# Patient Record
Sex: Female | Born: 1980 | Race: White | Hispanic: No | Marital: Married | Smoking: Former smoker
Health system: Southern US, Community
[De-identification: ages and names within clinical notes are randomized; demographics above are authoritative.]

## PROBLEM LIST (undated history)

## (undated) DIAGNOSIS — D509 Iron deficiency anemia, unspecified: Secondary | ICD-10-CM

## (undated) DIAGNOSIS — G40909 Epilepsy, unspecified, not intractable, without status epilepticus: Secondary | ICD-10-CM

## (undated) DIAGNOSIS — Z973 Presence of spectacles and contact lenses: Secondary | ICD-10-CM

## (undated) DIAGNOSIS — J302 Other seasonal allergic rhinitis: Secondary | ICD-10-CM

## (undated) DIAGNOSIS — R569 Unspecified convulsions: Secondary | ICD-10-CM

## (undated) DIAGNOSIS — G2581 Restless legs syndrome: Secondary | ICD-10-CM

## (undated) DIAGNOSIS — Z8679 Personal history of other diseases of the circulatory system: Secondary | ICD-10-CM

## (undated) DIAGNOSIS — K219 Gastro-esophageal reflux disease without esophagitis: Secondary | ICD-10-CM

## (undated) DIAGNOSIS — N921 Excessive and frequent menstruation with irregular cycle: Secondary | ICD-10-CM

## (undated) HISTORY — DX: Other seasonal allergic rhinitis: J30.2

## (undated) HISTORY — PX: WISDOM TOOTH EXTRACTION: SHX21

## (undated) HISTORY — DX: Gastro-esophageal reflux disease without esophagitis: K21.9

---

## 2002-10-20 ENCOUNTER — Emergency Department (HOSPITAL_COMMUNITY): Admission: EM | Admit: 2002-10-20 | Discharge: 2002-10-20 | Payer: Self-pay | Admitting: *Deleted

## 2003-09-13 ENCOUNTER — Emergency Department (HOSPITAL_COMMUNITY): Admission: EM | Admit: 2003-09-13 | Discharge: 2003-09-13 | Payer: Self-pay | Admitting: Emergency Medicine

## 2004-01-20 ENCOUNTER — Emergency Department (HOSPITAL_COMMUNITY): Admission: EM | Admit: 2004-01-20 | Discharge: 2004-01-21 | Payer: Self-pay | Admitting: Emergency Medicine

## 2004-02-19 ENCOUNTER — Emergency Department (HOSPITAL_COMMUNITY): Admission: EM | Admit: 2004-02-19 | Discharge: 2004-02-19 | Payer: Self-pay | Admitting: Emergency Medicine

## 2004-04-24 ENCOUNTER — Emergency Department (HOSPITAL_COMMUNITY): Admission: EM | Admit: 2004-04-24 | Discharge: 2004-04-25 | Payer: Self-pay | Admitting: Emergency Medicine

## 2004-04-29 ENCOUNTER — Ambulatory Visit (HOSPITAL_COMMUNITY): Admission: RE | Admit: 2004-04-29 | Discharge: 2004-04-29 | Payer: Self-pay

## 2004-07-30 ENCOUNTER — Emergency Department (HOSPITAL_COMMUNITY): Admission: EM | Admit: 2004-07-30 | Discharge: 2004-07-30 | Payer: Self-pay | Admitting: Emergency Medicine

## 2005-05-20 ENCOUNTER — Inpatient Hospital Stay (HOSPITAL_COMMUNITY): Admission: AD | Admit: 2005-05-20 | Discharge: 2005-05-20 | Payer: Self-pay | Admitting: Obstetrics and Gynecology

## 2005-05-24 ENCOUNTER — Inpatient Hospital Stay (HOSPITAL_COMMUNITY): Admission: AD | Admit: 2005-05-24 | Discharge: 2005-05-24 | Payer: Self-pay | Admitting: *Deleted

## 2005-06-04 ENCOUNTER — Inpatient Hospital Stay (HOSPITAL_COMMUNITY): Admission: AD | Admit: 2005-06-04 | Discharge: 2005-06-05 | Payer: Self-pay | Admitting: Obstetrics and Gynecology

## 2005-06-06 ENCOUNTER — Inpatient Hospital Stay (HOSPITAL_COMMUNITY): Admission: AD | Admit: 2005-06-06 | Discharge: 2005-06-11 | Payer: Self-pay | Admitting: Obstetrics and Gynecology

## 2005-06-14 ENCOUNTER — Inpatient Hospital Stay (HOSPITAL_COMMUNITY): Admission: AD | Admit: 2005-06-14 | Discharge: 2005-06-14 | Payer: Self-pay | Admitting: Obstetrics and Gynecology

## 2005-06-14 ENCOUNTER — Encounter: Payer: Self-pay | Admitting: Emergency Medicine

## 2005-06-14 ENCOUNTER — Encounter (INDEPENDENT_AMBULATORY_CARE_PROVIDER_SITE_OTHER): Payer: Self-pay | Admitting: Cardiology

## 2006-04-28 ENCOUNTER — Emergency Department (HOSPITAL_COMMUNITY): Admission: EM | Admit: 2006-04-28 | Discharge: 2006-04-28 | Payer: Self-pay | Admitting: Family Medicine

## 2006-05-29 ENCOUNTER — Emergency Department (HOSPITAL_COMMUNITY): Admission: EM | Admit: 2006-05-29 | Discharge: 2006-05-29 | Payer: Self-pay | Admitting: Family Medicine

## 2006-06-15 ENCOUNTER — Emergency Department (HOSPITAL_COMMUNITY): Admission: EM | Admit: 2006-06-15 | Discharge: 2006-06-15 | Payer: Self-pay | Admitting: Emergency Medicine

## 2006-11-23 ENCOUNTER — Emergency Department (HOSPITAL_COMMUNITY): Admission: EM | Admit: 2006-11-23 | Discharge: 2006-11-23 | Payer: Self-pay | Admitting: Family Medicine

## 2007-05-02 ENCOUNTER — Emergency Department (HOSPITAL_COMMUNITY): Admission: EM | Admit: 2007-05-02 | Discharge: 2007-05-02 | Payer: Self-pay | Admitting: Emergency Medicine

## 2007-08-09 ENCOUNTER — Emergency Department (HOSPITAL_COMMUNITY): Admission: EM | Admit: 2007-08-09 | Discharge: 2007-08-09 | Payer: Self-pay | Admitting: Emergency Medicine

## 2007-08-13 ENCOUNTER — Inpatient Hospital Stay (HOSPITAL_COMMUNITY): Admission: AD | Admit: 2007-08-13 | Discharge: 2007-08-13 | Payer: Self-pay | Admitting: Obstetrics

## 2007-08-16 ENCOUNTER — Ambulatory Visit (HOSPITAL_COMMUNITY): Admission: RE | Admit: 2007-08-16 | Discharge: 2007-08-16 | Payer: Self-pay | Admitting: Certified Nurse Midwife

## 2007-10-24 ENCOUNTER — Emergency Department (HOSPITAL_COMMUNITY): Admission: EM | Admit: 2007-10-24 | Discharge: 2007-10-24 | Payer: Self-pay | Admitting: Family Medicine

## 2008-01-28 ENCOUNTER — Inpatient Hospital Stay (HOSPITAL_COMMUNITY): Admission: AD | Admit: 2008-01-28 | Discharge: 2008-01-29 | Payer: Self-pay | Admitting: Obstetrics

## 2008-02-08 ENCOUNTER — Inpatient Hospital Stay (HOSPITAL_COMMUNITY): Admission: AD | Admit: 2008-02-08 | Discharge: 2008-02-11 | Payer: Self-pay | Admitting: Obstetrics & Gynecology

## 2008-04-16 HISTORY — PX: LAPAROSCOPIC CHOLECYSTECTOMY: SUR755

## 2008-04-17 ENCOUNTER — Encounter: Admission: RE | Admit: 2008-04-17 | Discharge: 2008-04-17 | Payer: Self-pay | Admitting: Family Medicine

## 2008-06-18 ENCOUNTER — Emergency Department (HOSPITAL_COMMUNITY): Admission: EM | Admit: 2008-06-18 | Discharge: 2008-06-18 | Payer: Self-pay | Admitting: Emergency Medicine

## 2009-08-13 ENCOUNTER — Emergency Department (HOSPITAL_COMMUNITY): Admission: EM | Admit: 2009-08-13 | Discharge: 2009-08-13 | Payer: Self-pay | Admitting: Emergency Medicine

## 2010-07-12 ENCOUNTER — Emergency Department (HOSPITAL_COMMUNITY): Admission: EM | Admit: 2010-07-12 | Discharge: 2010-07-12 | Payer: Self-pay | Admitting: Emergency Medicine

## 2010-12-07 ENCOUNTER — Encounter: Payer: Self-pay | Admitting: Family Medicine

## 2010-12-09 LAB — HEPATITIS B SURFACE ANTIGEN: Hepatitis B Surface Ag: NEGATIVE

## 2010-12-09 LAB — ABO/RH: RH Type: POSITIVE

## 2010-12-09 LAB — HIV ANTIBODY (ROUTINE TESTING W REFLEX): HIV: NONREACTIVE

## 2011-02-20 LAB — CBC
HCT: 36 % (ref 36.0–46.0)
Hemoglobin: 11.6 g/dL — ABNORMAL LOW (ref 12.0–15.0)
MCHC: 32.3 g/dL (ref 30.0–36.0)
MCV: 73.4 fL — ABNORMAL LOW (ref 78.0–100.0)
Platelets: 302 10*3/uL (ref 150–400)
RDW: 15.7 % — ABNORMAL HIGH (ref 11.5–15.5)

## 2011-02-20 LAB — URINE CULTURE: Colony Count: 80000

## 2011-02-20 LAB — DIFFERENTIAL
Basophils Absolute: 0 10*3/uL (ref 0.0–0.1)
Eosinophils Absolute: 0.1 10*3/uL (ref 0.0–0.7)
Eosinophils Relative: 1 % (ref 0–5)
Monocytes Absolute: 0.4 10*3/uL (ref 0.1–1.0)

## 2011-02-20 LAB — URINALYSIS, ROUTINE W REFLEX MICROSCOPIC
Glucose, UA: NEGATIVE mg/dL
Hgb urine dipstick: NEGATIVE
Protein, ur: NEGATIVE mg/dL
Specific Gravity, Urine: 1.016 (ref 1.005–1.030)
Urobilinogen, UA: 0.2 mg/dL (ref 0.0–1.0)

## 2011-02-20 LAB — POCT I-STAT, CHEM 8
Calcium, Ion: 1.16 mmol/L (ref 1.12–1.32)
Creatinine, Ser: 0.6 mg/dL (ref 0.4–1.2)
Glucose, Bld: 115 mg/dL — ABNORMAL HIGH (ref 70–99)
HCT: 37 % (ref 36.0–46.0)
Hemoglobin: 12.6 g/dL (ref 12.0–15.0)

## 2011-02-20 LAB — URINE MICROSCOPIC-ADD ON

## 2011-02-20 LAB — D-DIMER, QUANTITATIVE: D-Dimer, Quant: <0.22 ug/mL-FEU (ref 0.00–0.48)

## 2011-03-20 ENCOUNTER — Encounter: Payer: Self-pay | Admitting: Cardiovascular Disease

## 2011-03-20 DIAGNOSIS — I10 Essential (primary) hypertension: Secondary | ICD-10-CM | POA: Insufficient documentation

## 2011-03-20 DIAGNOSIS — D649 Anemia, unspecified: Secondary | ICD-10-CM | POA: Insufficient documentation

## 2011-03-20 DIAGNOSIS — J4520 Mild intermittent asthma, uncomplicated: Secondary | ICD-10-CM | POA: Insufficient documentation

## 2011-03-21 ENCOUNTER — Inpatient Hospital Stay (HOSPITAL_COMMUNITY)
Admission: AD | Admit: 2011-03-21 | Discharge: 2011-03-21 | Disposition: A | Discharge status: Home / Self Care | Payer: Medicaid Other | Source: Ambulatory Visit | Attending: Obstetrics and Gynecology | Admitting: Obstetrics and Gynecology

## 2011-03-21 DIAGNOSIS — R197 Diarrhea, unspecified: Secondary | ICD-10-CM | POA: Insufficient documentation

## 2011-03-21 DIAGNOSIS — O212 Late vomiting of pregnancy: Secondary | ICD-10-CM | POA: Insufficient documentation

## 2011-03-21 LAB — URINALYSIS, ROUTINE W REFLEX MICROSCOPIC
Bilirubin Urine: NEGATIVE
Glucose, UA: NEGATIVE mg/dL
Ketones, ur: NEGATIVE mg/dL
Specific Gravity, Urine: 1.015 (ref 1.005–1.030)
pH: 5.5 (ref 5.0–8.0)

## 2011-03-21 LAB — URINE MICROSCOPIC-ADD ON

## 2011-03-21 LAB — COMPREHENSIVE METABOLIC PANEL
ALT: 10 U/L (ref 0–35)
CO2: 18 mEq/L — ABNORMAL LOW (ref 19–32)
Calcium: 9.3 mg/dL (ref 8.4–10.5)
Chloride: 101 mEq/L (ref 96–112)
Creatinine, Ser: 0.61 mg/dL (ref 0.4–1.2)
GFR calc non Af Amer: >60 mL/min (ref >60)
Glucose, Bld: 94 mg/dL (ref 70–99)
Total Bilirubin: 0.2 mg/dL — ABNORMAL LOW (ref 0.3–1.2)

## 2011-03-21 LAB — CBC
HCT: 33.4 % — ABNORMAL LOW (ref 36.0–46.0)
Hemoglobin: 10.5 g/dL — ABNORMAL LOW (ref 12.0–15.0)
MCH: 23.2 pg — ABNORMAL LOW (ref 26.0–34.0)
MCHC: 31.4 g/dL (ref 30.0–36.0)

## 2011-03-21 LAB — DIFFERENTIAL
Basophils Absolute: 0 10*3/uL (ref 0.0–0.1)
Basophils Relative: 0 % (ref 0–1)
Lymphocytes Relative: 12 % (ref 12–46)
Monocytes Absolute: 0.6 10*3/uL (ref 0.1–1.0)
Neutro Abs: 11 10*3/uL — ABNORMAL HIGH (ref 1.7–7.7)
Neutrophils Relative %: 83 % — ABNORMAL HIGH (ref 43–77)

## 2011-03-23 ENCOUNTER — Inpatient Hospital Stay (HOSPITAL_COMMUNITY)
Admission: AD | Admit: 2011-03-23 | Discharge: 2011-03-23 | Disposition: A | Discharge status: Home / Self Care | Payer: Medicaid Other | Source: Ambulatory Visit | Attending: Obstetrics and Gynecology | Admitting: Obstetrics and Gynecology

## 2011-03-23 DIAGNOSIS — B9789 Other viral agents as the cause of diseases classified elsewhere: Secondary | ICD-10-CM | POA: Insufficient documentation

## 2011-03-23 DIAGNOSIS — E860 Dehydration: Secondary | ICD-10-CM | POA: Insufficient documentation

## 2011-03-23 DIAGNOSIS — O99891 Other specified diseases and conditions complicating pregnancy: Secondary | ICD-10-CM | POA: Insufficient documentation

## 2011-03-23 LAB — COMPREHENSIVE METABOLIC PANEL
ALT: 31 U/L (ref 0–35)
Alkaline Phosphatase: 112 U/L (ref 39–117)
BUN: 5 mg/dL — ABNORMAL LOW (ref 6–23)
CO2: 21 mEq/L (ref 19–32)
Chloride: 101 mEq/L (ref 96–112)
GFR calc non Af Amer: >60 mL/min (ref >60)
Glucose, Bld: 87 mg/dL (ref 70–99)
Potassium: 3.5 mEq/L (ref 3.5–5.1)
Sodium: 132 mEq/L — ABNORMAL LOW (ref 135–145)
Total Bilirubin: 0.2 mg/dL — ABNORMAL LOW (ref 0.3–1.2)

## 2011-03-23 LAB — URINE CULTURE: Culture: NO GROWTH

## 2011-03-23 LAB — OVA AND PARASITE EXAMINATION: Ova and parasites: NONE SEEN

## 2011-03-23 LAB — URINALYSIS, ROUTINE W REFLEX MICROSCOPIC
Nitrite: NEGATIVE
Protein, ur: NEGATIVE mg/dL
Specific Gravity, Urine: 1.025 (ref 1.005–1.030)
Urobilinogen, UA: 0.2 mg/dL (ref 0.0–1.0)

## 2011-03-23 LAB — CBC
HCT: 32.3 % — ABNORMAL LOW (ref 36.0–46.0)
RDW: 17 % — ABNORMAL HIGH (ref 11.5–15.5)
WBC: 10 10*3/uL (ref 4.0–10.5)

## 2011-03-23 LAB — URINE MICROSCOPIC-ADD ON

## 2011-03-26 ENCOUNTER — Ambulatory Visit: Payer: Self-pay | Admitting: Cardiovascular Disease

## 2011-03-26 ENCOUNTER — Encounter: Payer: Self-pay | Admitting: Cardiovascular Disease

## 2011-03-26 ENCOUNTER — Ambulatory Visit (INDEPENDENT_AMBULATORY_CARE_PROVIDER_SITE_OTHER): Payer: Medicaid Other | Admitting: Cardiovascular Disease

## 2011-03-26 VITALS — BP 116/66 | HR 80 | Ht 67.0 in | Wt 265.0 lb

## 2011-03-26 DIAGNOSIS — I509 Heart failure, unspecified: Secondary | ICD-10-CM

## 2011-03-26 NOTE — Progress Notes (Signed)
Lindsay Aguilar Date of Birth  April 17, 1981 [facility] / [facility] [address]     [address] [place], [place]  [address] [phone]  Fax  [phone]  History of Present Illness:  [redacted] weeks pregnant,  History of CHF with her first pregnancy.  2nd pregnancy was normal. No dyspnea. No chest pain. No syncope. No leg swelling.   Normal indigestion and other pregnancy related.  Recently had food poisoning.  Current Outpatient Prescriptions on File Prior to Visit  Medication Sig Dispense Refill  . Prenatal Vit-Fe Sulfate-FA (PRENATAL MULTIVIT-IRON PO) Take by mouth daily.          Allergies no known allergies  Past Medical History  Diagnosis Date  . Asthma   . CHF (congestive heart failure)   . Hypertension   . Anemia     No past surgical history on file.  History  Smoking status  . Former Smoker  . Types: Cigarettes  . Quit date: 11/16/2006  Smokeless tobacco  . Not on file    History  Alcohol Use No    No family history on file.  Reviw of Systems:  Reviewed in the HPI.  All other systems are negative.  Physical Exam: BP 116/66  Pulse 80  Ht 5\' 7"  (1.702 m)  Wt 265 lb (120.203 kg)  BMI 41.50 kg/m2 The patient is alert and oriented x 3.  The mood and affect are normal.  The skin is warm and dry.  Color is normal.  The HEENT exam reveals that the sclera are nonicteric.  The mucous membranes are moist.  The carotids are 2+ without bruits.  There is no thyromegaly.  There is no JVD.  The lungs are clear.  The chest wall is non tender.  The heart exam reveals a regular rate with a normal S1 and S2.  There is a soft systolic murmur.  The PMI is not displaced.   Abdominal exam reveals good bowel sounds.  There is no guarding or rebound.  There is no hepatosplenomegaly or tenderness.  There are no masses.  Exam of the legs reveal no clubbing, cyanosis, or edema.  The legs are without rashes.  The distal pulses are intact.  Cranial nerves II -  XII are intact.  Motor and sensory functions are intact.  The gait is normal.  ECG:  Assessment / Plan:

## 2011-03-26 NOTE — Assessment & Plan Note (Signed)
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 She probably had congestive heart failure during her first pregnancy. Her second pregnancy was uncomplicated. So far her third pregnancy has been compensated by her doctor was worried that she may be at risk for cardiopathy.  At present she has no signs of congestive heart failure. She does not have an S3 gallop. She does not have any leg swelling. She denies any PND or orthopnea. At this point I cannot predict whether or not she'll have pericardial cardiomyopathy which is most likely to occur in her ninth month or just shortly after her delivery. She certainly has no signs of congestive heart failure at present.  I've asked her to watch her salt intake. I've asked her to exercise as much is possible. I'll see her back on an as-needed basis.

## 2011-03-31 NOTE — H&P (Signed)
NAME:  DERENDA, GIDDINGS NO.:  [id number]   MEDICAL RECORD NO.:  [id number]          PATIENT TYPE:   LOCATION:                                 FACILITY:   PHYSICIAN:  Lenoard Aden, M.D.DATE OF BIRTH:  23-Aug-1981   DATE OF ADMISSION:  02/08/2008  DATE OF DISCHARGE:                              HISTORY & PHYSICAL   CHIEF COMPLAINT:  Postdate.   HISTORY OF PRESENT ILLNESS:  The patient is a 30 year old white female  G2, P1 at 4 weeks' gestation who presents for induction of labor.   ALLERGIES:  SHE HAS ALLERGIES TO AMOXICILLIN AND NO KNOWN LATEX  ALLERGIES.   SOCIAL HISTORY:  She is a nonsmoker, nondrinker.  She denies domestic or  physical violence.   FAMILY HISTORY:  Heart disease, kidney stone, COPD, colon cancer,  alcohol abuse, gout, diabetes, and depression.   PERSONAL HISTORY:  History of anxious, asthma, wisdom tooth extraction,  and anemia.  She is a previous smoker, reportedly reformed.  She had  previous vaginal delivery at 41 weeks', have a 7-pound 4-ounce fetus.  Her prenatal course today is uncomplicated.   PHYSICAL EXAMINATION:  GENERAL:  She is well-developed, well-nourished  white female, in no acute distress.  HEENT:  Normal.  LUNGS:  Clear.  HEART:  Regular rate and rhythm.  ABDOMEN:  Soft, gravid, nondistended.  Estimated fetal on ultrasound 8-  1/2 pounds.  Cervix is fingertip, 50%, vertex -1.  EXTREMITIES:  __________  NEUROLOGIC: Nonfocal.  SKIN: Intact.   IMPRESSION:  A 41-week intrauterine pregnancy for postdate induction.   PLAN:  Place Cervidil after reactive NST and to start Pitocin in the  morning, penicillin prophylaxis for GBS positivity, anticipated  __________ vaginal delivery.      Lenoard Aden, M.D.  Electronically Signed    RJT/MEDQ  D:  02/08/2008  T:  02/09/2008  Job:  [id number]

## 2011-04-03 NOTE — Consult Note (Signed)
NAME:  Lindsay Aguilar, Lindsay Aguilar NO.:  [id number]   MEDICAL RECORD NO.:  [id number]          PATIENT TYPE:  [facility]   LOCATION:  [facility]                          FACILITY:  [facility]   PHYSICIAN:  Lenoard Aden, M.D.DATE OF BIRTH:  01/01/1981   DATE OF CONSULTATION:  05/20/2005  DATE OF DISCHARGE:                                   CONSULTATION   CHIEF COMPLAINT:  Decreased fetal movement.   HISTORY OF PRESENT ILLNESS:  The patient is a 30 year old white female, G1,  P0, [facility] June 04, 2005, at 37 plus weeks, who presents with decreased fetal  movement today.   ALLERGIES:  AMOXICILLIN.   MEDICATIONS:  Prenatal vitamins.   PAST MEDICAL HISTORY:  1.  Allergies.  2.  Wisdom tooth removal.   FAMILY HISTORY:  History of hypertension, myocardial infarction, chronic  obstructive pulmonary disease, insulin-dependent diabetes, and urolithiasis.   PRENATAL COURSE:  Uncomplicated.   PHYSICAL EXAMINATION:  GENERAL:  She is a well-developed, well-nourished,  white female in no acute distress.  HEENT:  Normal.  LUNGS:  Clear.  HEART:  Regular rhythm.  ABDOMEN:  Soft, gravid, nontender.  CERVICAL EXAM:  Deferred.  NEUROLOGIC:  Nonfocal.  SKIN:  Intact.   IMPRESSION:  1.  A 37 week intrauterine pregnancy.  2.  Decreased fetal movement with reactive NST.   PLAN:  Discharge home.  Follow up in the office for scheduled visit.  Fetal  activity discussed.       RJT/MEDQ  D:  05/20/2005  T:  05/20/2005  Job:  [id number]   cc:   Ma Hillock

## 2011-04-17 ENCOUNTER — Ambulatory Visit (HOSPITAL_COMMUNITY)
Admission: RE | Admit: 2011-04-17 | Discharge: 2011-04-17 | Disposition: A | Discharge status: Home / Self Care | Payer: Medicaid Other | Source: Ambulatory Visit | Attending: Obstetrics and Gynecology | Admitting: Obstetrics and Gynecology

## 2011-05-22 ENCOUNTER — Inpatient Hospital Stay (HOSPITAL_COMMUNITY)
Admission: AD | Admit: 2011-05-22 | Discharge: 2011-05-22 | Disposition: A | Discharge status: Home / Self Care | Payer: Medicaid Other | Source: Ambulatory Visit | Attending: Obstetrics and Gynecology | Admitting: Obstetrics and Gynecology

## 2011-05-22 DIAGNOSIS — O479 False labor, unspecified: Secondary | ICD-10-CM | POA: Insufficient documentation

## 2011-05-25 ENCOUNTER — Inpatient Hospital Stay (HOSPITAL_COMMUNITY)
Admission: AD | Admit: 2011-05-25 | Discharge: 2011-05-25 | Disposition: A | Discharge status: Home / Self Care | Payer: Medicaid Other | Source: Ambulatory Visit | Attending: Obstetrics and Gynecology | Admitting: Obstetrics and Gynecology

## 2011-05-25 ENCOUNTER — Encounter (HOSPITAL_COMMUNITY): Payer: Self-pay

## 2011-05-25 DIAGNOSIS — O471 False labor at or after 37 completed weeks of gestation: Secondary | ICD-10-CM

## 2011-05-25 DIAGNOSIS — O479 False labor, unspecified: Secondary | ICD-10-CM | POA: Insufficient documentation

## 2011-05-25 NOTE — Initial Assessments (Signed)
Pt C/O small amount of bleeding, noted on toilet tissue, at 0530.  States she had more bright red bleeding @ 0730.  Cramping x 1 week.

## 2011-05-25 NOTE — Progress Notes (Signed)
Pt states she had blood on the tissue with wiping after urinating x 2 this am. Pad pt has on does not have any blood on it. Last exam was on Friday and no intercourse in several days

## 2011-05-25 NOTE — ED Provider Notes (Signed)
History     Chief Complaint  Patient presents with  . Vaginal Bleeding   Vaginal Bleeding The patient's primary symptoms include vaginal bleeding. The current episode started today. The problem has been resolved. The pain is mild. The problem affects both sides. She is pregnant. Pertinent negatives include no abdominal pain, chills, constipation, diarrhea, dysuria, flank pain, frequency, headaches, hematuria, nausea, urgency or vomiting. She has not been passing clots. She has not been passing tissue. The symptoms are aggravated by nothing. She has tried nothing for the symptoms. The treatment provided significant relief. Sexual activity: No recent Sexual Activity. No, her partner does not have an STD.   Describes 2 episodes of BRB this am on tissue after wiping. Denies Reg UC's, LOF. Denies prev hx of hemorrhoids. Denies Urinary s/sx. Reports GFM.   OB History    Grav Para Term Preterm Abortions TAB SAB Ect Mult Living   3 2 2  0 0 0 0 0 0 2      Past Medical History  Diagnosis Date  . Asthma   . 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 Hypertension   . Anemia     only with pregnancy    Past Surgical History  Procedure Date  . Cholecystectomy 04/2008  . Wisdom tooth extraction 1997    Family History  Problem Relation Age of Onset  . Anesthesia problems Other     History  Substance Use Topics  . Smoking status: Former Smoker    Types: Cigarettes    Quit date: 11/16/2006  . Smokeless tobacco: Former Neurosurgeon    Quit date: 05/24/2008  . Alcohol Use: No    Allergies: No Known Allergies  Prescriptions prior to admission  Medication Sig Dispense Refill  . acetaminophen (TYLENOL) 500 MG tablet Take 1,000 mg by mouth daily as needed. For pain       . cyclobenzaprine (FLEXERIL) 10 MG tablet Take 10 mg by mouth at bedtime as needed. For sciatica      . ferrous sulfate 325 (65 FE) MG tablet Take 650 mg by mouth daily with  breakfast.        . hydrOXYzine (VISTARIL) 50 MG capsule Take 50 mg by mouth at bedtime as needed. For itching.       . pantoprazole (PROTONIX) 40 MG tablet Take 40 mg by mouth daily.        . Prenatal Vit-Fe Sulfate-FA (PRENATAL MULTIVIT-IRON PO) Take 1 tablet by mouth daily.         Review of Systems  Constitutional: Negative for chills.  Eyes: Negative for blurred vision and photophobia.  Respiratory: Negative for shortness of breath.   Cardiovascular: Positive for leg swelling (2+ pitting edema to lower extremities, no change from previous visit). Negative for chest pain.  Gastrointestinal: Negative for heartburn, nausea, vomiting, abdominal pain, diarrhea and constipation.  Genitourinary: Positive for vaginal bleeding. Negative for dysuria, urgency, frequency, hematuria and flank pain.  Neurological: Negative for dizziness, sensory change and headaches.   Physical Exam   Blood pressure 101/64, pulse 97, temperature 98.3 F (36.8 C), temperature source Oral, resp. rate 20, height 5\' 7"  (1.702 m), weight 286 lb (129.729 kg).  Physical Exam  Constitutional: She is oriented to person, place, and time. She appears well-developed and well-nourished. No distress.  HENT:  Head: Normocephalic.  Cardiovascular: Regular rhythm.   Respiratory: Effort normal.  GI:       Gravid, NT, approp for gest age  Genitourinary: Vagina normal and uterus normal. No vaginal discharge found.  Musculoskeletal: She exhibits edema.  Neurological: She is alert and oriented to person, place, and time. She has normal reflexes.  Skin: Skin is warm and dry.  Psychiatric: She has a normal mood and affect.  FHR: 125 Cat I Reactive NST, no decels Toco: occ, mild UC, pt reports as "mild menstrual cramping"  MAU Course  Procedures Sterile Spec exam: no bleeding noted @ os, in vault, or on perineum SVE 1/thick/-2 vtx BOW Intact, no blood on glove

## 2011-05-25 NOTE — Progress Notes (Signed)
Irish Lack CNM notified of pt status, small amount bleeding today, irregular uc's, one variable decel noted.  CNM is coming to see pt.

## 2011-05-28 ENCOUNTER — Other Ambulatory Visit (HOSPITAL_COMMUNITY): Payer: Self-pay | Admitting: Obstetrics and Gynecology

## 2011-05-28 DIAGNOSIS — O368190 Decreased fetal movements, unspecified trimester, not applicable or unspecified: Secondary | ICD-10-CM

## 2011-05-29 ENCOUNTER — Ambulatory Visit (HOSPITAL_COMMUNITY)
Admission: RE | Admit: 2011-05-29 | Discharge: 2011-05-29 | Disposition: A | Discharge status: Home / Self Care | Payer: Medicaid Other | Source: Ambulatory Visit | Attending: Obstetrics and Gynecology | Admitting: Obstetrics and Gynecology

## 2011-05-29 ENCOUNTER — Inpatient Hospital Stay (HOSPITAL_COMMUNITY)
Admission: AD | Admit: 2011-05-29 | Discharge: 2011-05-29 | Disposition: A | Discharge status: Home / Self Care | Payer: Medicaid Other | Source: Ambulatory Visit | Attending: Obstetrics and Gynecology | Admitting: Obstetrics and Gynecology

## 2011-05-29 ENCOUNTER — Other Ambulatory Visit: Payer: Self-pay | Admitting: Obstetrics and Gynecology

## 2011-05-29 ENCOUNTER — Encounter (HOSPITAL_COMMUNITY): Payer: Self-pay | Admitting: *Deleted

## 2011-05-29 DIAGNOSIS — O368190 Decreased fetal movements, unspecified trimester, not applicable or unspecified: Secondary | ICD-10-CM

## 2011-05-29 NOTE — Initial Assessments (Signed)
Nigel Bridgeman , CNM in room discussion plan

## 2011-05-29 NOTE — Progress Notes (Signed)
nst after [place] 6/8 BPP

## 2011-05-29 NOTE — H&P (Signed)
Lindsay Aguilar is an 30 y.o. female. Who presents for induction due to perception of decreased FM over the last week, with BPP 6/8 on 7/13, but negative spontaneous CST in MAU.  Patient requests induction due to increased anxiety regarding fetal status.  She accepts the risk of induction including failure of method, prolonged labor, need for serial induction, and possible need for C/S.  Pregnancy remarkable for: Asthma Elevated BMI Hx pp pulmonary edema Itching during pregnancy, with negative w/u for elevated bile acids. ASCUS on pap with negative HPV GBS negative Mild anemia  Pertinent Gynecological History: Menses: regular every month without intermenstrual spotting  Contraception: none DES exposure: unknown Blood transfusions: none Sexually transmitted diseases: none Previous GYN Procedures: none  Last mammogram: NA Last pap: abnormal: ASCUS with Negative HPV Date: 1/12 OB History: G3, P2 Patient was induced at 41 weeks with both previous babies. Had pp pulmonary edema after last delivery, likely due to mildly elevated BP  Menstrual History: Menarche age: 29 No LMP recorded. Patient is pregnant.   LMP:  08/29/2010 Past Medical History  Diagnosis Date  . Asthma   . 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 Hypertension   . Anemia     only with pregnancy    Past Surgical History  Procedure Date  . Cholecystectomy 04/2008  . Wisdom tooth extraction 1997    Family History  Problem Relation Age of Onset  . Anesthesia problems Other   . Depression Father     Social History:  reports that she quit smoking about 4 years ago. Her smoking use included Cigarettes. She quit smokeless tobacco use about 3 years ago. She reports that she does not drink alcohol or use illicit drugs. Married to FOB, Adela [name], who is involved and supportive.  Patient is [facility].    Allergies: No Known Allergies   (Not in a hospital  admission)  Review of Systems  All other systems reviewed and are negative.    There were no vitals taken for this visit. Physical Exam  Constitutional: She is oriented to person, place, and time. Vital signs are normal. She appears well-developed and well-nourished.  HENT:  Head: Normocephalic.  Cardiovascular: Normal rate and regular rhythm.   Respiratory: Breath sounds normal.  Musculoskeletal: Normal range of motion.  Neurological: She is alert and oriented to person, place, and time. She has normal reflexes.  Skin: Skin is warm and dry.  Psychiatric: She has a normal mood and affect.   Cervix:  On 05/29/11, cervix was external os 1 cm, internal os FT, long, vtx, -2, cx posterior.  No results found for this or any previous visit (from the past 24 hour(s)).  @[id number] @  Assessment/Plan: IUP at 39 weeks Perceived decreased FM, with attendant anxiety, and BPP 6/8 GBS negative  Plan: Admit to [facility] per consult with Dr. Pennie Rushing. Routine CNM orders Plan cervical ripening with Cervidil overnight, then pitocin in am. Pain medication prn.  Urania Pearlman L 05/29/2011, 3:40 PM

## 2011-05-30 ENCOUNTER — Inpatient Hospital Stay (HOSPITAL_COMMUNITY)
Admission: RE | Admit: 2011-05-30 | Discharge: 2011-06-02 | DRG: 775 | Disposition: A | Discharge status: Home / Self Care | Payer: Medicaid Other | Source: Ambulatory Visit | Attending: Obstetrics and Gynecology | Admitting: Obstetrics and Gynecology

## 2011-05-30 ENCOUNTER — Encounter (HOSPITAL_COMMUNITY): Payer: Self-pay

## 2011-05-30 VITALS — BP 102/64 | HR 73 | Temp 98.0°F | Resp 18 | Ht 67.0 in | Wt 278.2 lb

## 2011-05-30 DIAGNOSIS — O9903 Anemia complicating the puerperium: Secondary | ICD-10-CM | POA: Diagnosis not present

## 2011-05-30 DIAGNOSIS — D649 Anemia, unspecified: Secondary | ICD-10-CM | POA: Diagnosis not present

## 2011-05-30 DIAGNOSIS — O368190 Decreased fetal movements, unspecified trimester, not applicable or unspecified: Principal | ICD-10-CM | POA: Diagnosis present

## 2011-05-30 LAB — COMPREHENSIVE METABOLIC PANEL
ALT: 9 U/L (ref 0–35)
Alkaline Phosphatase: 158 U/L — ABNORMAL HIGH (ref 39–117)
CO2: 21 mEq/L (ref 19–32)
GFR calc Af Amer: >60 mL/min (ref >60)
GFR calc non Af Amer: >60 mL/min (ref >60)
Glucose, Bld: 84 mg/dL (ref 70–99)
Potassium: 3.7 mEq/L (ref 3.5–5.1)
Sodium: 129 mEq/L — ABNORMAL LOW (ref 135–145)
Total Bilirubin: 0.2 mg/dL — ABNORMAL LOW (ref 0.3–1.2)

## 2011-05-30 LAB — CBC
HCT: 30.3 % — ABNORMAL LOW (ref 36.0–46.0)
Hemoglobin: 9.7 g/dL — ABNORMAL LOW (ref 12.0–15.0)
MCHC: 32 g/dL (ref 30.0–36.0)
RDW: 18.4 % — ABNORMAL HIGH (ref 11.5–15.5)
WBC: 18.1 10*3/uL — ABNORMAL HIGH (ref 4.0–10.5)

## 2011-05-30 LAB — URINALYSIS, DIPSTICK ONLY
Bilirubin Urine: NEGATIVE
Ketones, ur: NEGATIVE mg/dL
Nitrite: NEGATIVE
Urobilinogen, UA: 0.2 mg/dL (ref 0.0–1.0)

## 2011-05-30 MED ORDER — ZOLPIDEM TARTRATE 10 MG PO TABS: 10.0000 mg | ORAL_TABLET | Freq: Every evening | ORAL | Status: DC | PRN

## 2011-05-30 MED ORDER — SODIUM CHLORIDE 0.9 % IJ SOLN
3.0000 mL | INTRAMUSCULAR | Status: DC | PRN
  Administered 2011-05-31: 3 mL via INTRAVENOUS

## 2011-05-30 MED ORDER — TERBUTALINE SULFATE 1 MG/ML IJ SOLN: 0.2500 mg | Freq: Once | INTRAMUSCULAR | Status: AC | PRN

## 2011-05-30 MED ORDER — LIDOCAINE HCL (PF) 1 % IJ SOLN
30.0000 mL | INTRAMUSCULAR | Status: DC | PRN
  Filled 2011-05-30: qty 30

## 2011-05-30 MED ORDER — NALBUPHINE SYRINGE 5 MG/0.5 ML
10.0000 mg | INJECTION | INTRAMUSCULAR | Status: DC | PRN
  Filled 2011-05-30 (×2): qty 1

## 2011-05-30 MED ORDER — SODIUM CHLORIDE 0.9 % IJ SOLN: 3.0000 mL | Freq: Two times a day (BID) | INTRAMUSCULAR | Status: DC

## 2011-05-30 MED ORDER — ONDANSETRON HCL 4 MG/2ML IJ SOLN: 4.0000 mg | Freq: Four times a day (QID) | INTRAMUSCULAR | Status: DC | PRN

## 2011-05-30 MED ORDER — OXYTOCIN 20 UNITS IN LACTATED RINGERS INFUSION - SIMPLE
1.0000 m[IU]/min | INTRAVENOUS | Status: DC
  Administered 2011-05-31: 2 m[IU]/min via INTRAVENOUS
  Administered 2011-05-31: 6 m[IU]/min via INTRAVENOUS
  Filled 2011-05-30: qty 1000

## 2011-05-30 MED ORDER — CITRIC ACID-SODIUM CITRATE 334-500 MG/5ML PO SOLN: 30.0000 mL | ORAL | Status: DC | PRN

## 2011-05-30 MED ORDER — ACETAMINOPHEN 325 MG PO TABS: 650.0000 mg | ORAL_TABLET | ORAL | Status: DC | PRN

## 2011-05-30 MED ORDER — FLEET ENEMA 7-19 GM/118ML RE ENEM: 1.0000 | ENEMA | RECTAL | Status: DC | PRN

## 2011-05-30 MED ORDER — DIPHENHYDRAMINE HCL 25 MG PO CAPS
50.0000 mg | ORAL_CAPSULE | Freq: Every evening | ORAL | Status: DC | PRN
  Administered 2011-05-30: 50 mg via ORAL
  Filled 2011-05-30: qty 2

## 2011-05-30 MED ORDER — IBUPROFEN 600 MG PO TABS: 600.0000 mg | ORAL_TABLET | Freq: Four times a day (QID) | ORAL | Status: DC | PRN

## 2011-05-30 MED ORDER — DINOPROSTONE 10 MG VA INST
10.0000 mg | VAGINAL_INSERT | Freq: Once | VAGINAL | Status: AC
  Administered 2011-05-30: 10 mg via VAGINAL
  Filled 2011-05-30: qty 1

## 2011-05-30 MED ORDER — SODIUM CHLORIDE 0.9 % IV SOLN: 250.0000 mL | INTRAVENOUS | Status: DC

## 2011-05-30 MED ORDER — OXYCODONE-ACETAMINOPHEN 5-325 MG PO TABS: 2.0000 | ORAL_TABLET | ORAL | Status: DC | PRN

## 2011-05-30 MED ORDER — LACTATED RINGERS IV SOLN
500.0000 mL | INTRAVENOUS | Status: DC | PRN
  Administered 2011-05-31: 500 mL via INTRAVENOUS

## 2011-05-30 NOTE — Progress Notes (Signed)
  Subjective: Resting quietly.  Objective: BP 121/98  Pulse 105  Temp(Src) 98.5 F (36.9 C) (Oral)  Resp 20  Ht 5\' 7"  (1.702 m)  Wt 128.822 kg (284 lb)  BMI 44.48 kg/m2 on admission. Most recent BP 106/69 per RN report [facility] labs WNL. UA negative for protein.      FHT:  FHR: 140 bpm, variability: moderate,  accelerations:  Present,  decelerations:  Absent.  Overall review of tracing shows baseline 130-140, with audible accelerations with movement. UC:   irregular, mild  Assessment / Plan: Continue to observe, maintain continuous EFM through night. D/C cervidil 12 hour after placement, then start pitocin. Pain med prn.  Loredana Medellin L 05/30/2011, 11:14 PM

## 2011-05-30 NOTE — Progress Notes (Signed)
  Subjective: Here for induction due to perception of decreased FM over last several days and repetitive BPP results of 6/8, history of pp pulmonary edema.  Denies HA, visual symptoms, epigastric pain.  Does report increased swelling over last 1-2 weeks.  Objective: BP 121/98  Pulse 105  Temp(Src) 98.5 F (36.9 C) (Oral)  Resp 20  Ht 5\' 7"  (1.702 m)  Wt 128.822 kg (284 lb)  BMI 44.48 kg/m2      FHT:  FHR: 150 bpm, variability: moderate,  accelerations:  Present,  decelerations:  Absent except for occasional mild variables. UC:   occasional SVE:   Dilation: Fingertip Effacement (%): Thick Station: -2 Exam by:: Emilee Hero, CNM External os 1 cm, internal os FT.  Labs: Lab Results  Component Value Date   WBC 10.0 03/23/2011   HGB 10.0* 03/23/2011   HCT 32.3* 03/23/2011   MCV 73.7* 03/23/2011   PLT 238 03/23/2011    Assessment / Plan: IUP at 39 weeks For induction due to decreased FM, BPP 6/8 x 2 this week. History of pp pulmonary edema GBS negative. Unfavorable cervix  Draw [facility] labs Check UA for protein Cervidil tonight, pitocin in am Benadryl for sleep (pt reports Ambien doesn't work. R&B of induction reviewed with patient, including failure of method, prolonged labor, need for C/S.  Patient and husband seem to understand these risks and wish to proceed. Had consulted with Dr. Pennie Rushing yesterday regarding this plan.  Anticipated MOD:  NSVD  LATHAM,VICKI L 05/30/2011, 9:16 PM

## 2011-05-31 ENCOUNTER — Encounter (HOSPITAL_COMMUNITY): Payer: Self-pay

## 2011-05-31 ENCOUNTER — Encounter (HOSPITAL_COMMUNITY): Payer: Self-pay | Admitting: Anesthesiology

## 2011-05-31 ENCOUNTER — Inpatient Hospital Stay (HOSPITAL_COMMUNITY): Payer: Medicaid Other | Admitting: Anesthesiology

## 2011-05-31 ENCOUNTER — Other Ambulatory Visit: Payer: Self-pay | Admitting: Obstetrics and Gynecology

## 2011-05-31 LAB — RPR: RPR Ser Ql: NONREACTIVE

## 2011-05-31 MED ORDER — PHENYLEPHRINE 40 MCG/ML (10ML) SYRINGE FOR IV PUSH (FOR BLOOD PRESSURE SUPPORT): 80.0000 ug | PREFILLED_SYRINGE | INTRAVENOUS | Status: DC | PRN

## 2011-05-31 MED ORDER — EPHEDRINE 5 MG/ML INJ: 10.0000 mg | INTRAVENOUS | Status: DC | PRN

## 2011-05-31 MED ORDER — WITCH HAZEL-GLYCERIN EX PADS: MEDICATED_PAD | CUTANEOUS | Status: DC | PRN

## 2011-05-31 MED ORDER — SENNOSIDES-DOCUSATE SODIUM 8.6-50 MG PO TABS
1.0000 | ORAL_TABLET | Freq: Every day | ORAL | Status: DC
  Administered 2011-06-01: 1 via ORAL

## 2011-05-31 MED ORDER — NALBUPHINE SYRINGE 5 MG/0.5 ML
10.0000 mg | INJECTION | INTRAMUSCULAR | Status: DC | PRN
  Administered 2011-05-31 (×2): 10 mg via INTRAVENOUS
  Filled 2011-05-31: qty 1

## 2011-05-31 MED ORDER — FENTANYL 2.5 MCG/ML BUPIVACAINE 1/10 % EPIDURAL INFUSION (WH - ANES): 14.0000 mL/h | INTRAMUSCULAR | Status: DC

## 2011-05-31 MED ORDER — LACTATED RINGERS IV SOLN: 500.0000 mL | Freq: Once | INTRAVENOUS | Status: DC

## 2011-05-31 MED ORDER — BENZOCAINE-MENTHOL 20-0.5 % EX AERO: 1.0000 "application " | INHALATION_SPRAY | CUTANEOUS | Status: DC | PRN

## 2011-05-31 MED ORDER — IBUPROFEN 600 MG PO TABS
600.0000 mg | ORAL_TABLET | Freq: Four times a day (QID) | ORAL | Status: DC
  Administered 2011-05-31 – 2011-06-02 (×7): 600 mg via ORAL
  Filled 2011-05-31 (×7): qty 1

## 2011-05-31 MED ORDER — OXYTOCIN 20 UNITS IN LACTATED RINGERS INFUSION - SIMPLE: 500.0000 mL/h | Freq: Once | INTRAVENOUS | Status: DC

## 2011-05-31 MED ORDER — SIMETHICONE 80 MG PO CHEW: 80.0000 mg | CHEWABLE_TABLET | ORAL | Status: DC | PRN

## 2011-05-31 MED ORDER — MEASLES, MUMPS & RUBELLA VAC ~~LOC~~ INJ
0.5000 mL | INJECTION | Freq: Once | SUBCUTANEOUS | Status: DC
  Filled 2011-05-31: qty 0.5

## 2011-05-31 MED ORDER — ALBUTEROL 90 MCG/ACT IN AERS
2.0000 | INHALATION_SPRAY | Freq: Four times a day (QID) | RESPIRATORY_TRACT | Status: DC | PRN
  Filled 2011-05-31: qty 2

## 2011-05-31 MED ORDER — PANTOPRAZOLE SODIUM 40 MG PO TBEC
40.0000 mg | DELAYED_RELEASE_TABLET | Freq: Every day | ORAL | Status: DC
  Administered 2011-05-31 – 2011-06-02 (×2): 40 mg via ORAL
  Filled 2011-05-31 (×3): qty 1

## 2011-05-31 MED ORDER — OXYTOCIN 10 UNIT/ML IJ SOLN
INTRAMUSCULAR | Status: AC
  Administered 2011-05-31: 10 [IU] via INTRAMUSCULAR
  Filled 2011-05-31: qty 1

## 2011-05-31 MED ORDER — PHENYLEPHRINE 40 MCG/ML (10ML) SYRINGE FOR IV PUSH (FOR BLOOD PRESSURE SUPPORT)
PREFILLED_SYRINGE | INTRAVENOUS | Status: AC
  Filled 2011-05-31: qty 5

## 2011-05-31 MED ORDER — FENTANYL 2.5 MCG/ML BUPIVACAINE 1/10 % EPIDURAL INFUSION (WH - ANES)
14.0000 mL/h | INTRAMUSCULAR | Status: DC
  Administered 2011-05-31: 14 mL/h via EPIDURAL
  Filled 2011-05-31: qty 60

## 2011-05-31 MED ORDER — TETANUS-DIPHTH-ACELL PERTUSSIS 5-2.5-18.5 LF-MCG/0.5 IM SUSP
0.5000 mL | Freq: Once | INTRAMUSCULAR | Status: AC
  Administered 2011-06-01: 0.5 mL via INTRAMUSCULAR
  Filled 2011-05-31: qty 0.5

## 2011-05-31 MED ORDER — PRENATAL PLUS 27-1 MG PO TABS
1.0000 | ORAL_TABLET | Freq: Every day | ORAL | Status: DC
  Administered 2011-05-31 – 2011-06-02 (×3): 1 via ORAL
  Filled 2011-05-31 (×3): qty 1

## 2011-05-31 MED ORDER — OXYTOCIN 10 UNIT/ML IJ SOLN
10.0000 [IU] | Freq: Once | INTRAMUSCULAR | Status: AC
  Administered 2011-05-31: 10 [IU] via INTRAMUSCULAR

## 2011-05-31 MED ORDER — LANOLIN HYDROUS EX OINT: TOPICAL_OINTMENT | CUTANEOUS | Status: DC | PRN

## 2011-05-31 MED ORDER — EPHEDRINE 5 MG/ML INJ
10.0000 mg | INTRAVENOUS | Status: DC | PRN
Start: 2011-05-31 — End: 2011-05-31

## 2011-05-31 MED ORDER — LACTATED RINGERS IV SOLN
500.0000 mL | Freq: Once | INTRAVENOUS | Status: AC
  Administered 2011-05-31: 500 mL via INTRAVENOUS

## 2011-05-31 MED ORDER — EPHEDRINE 5 MG/ML INJ
INTRAVENOUS | Status: AC
  Filled 2011-05-31: qty 4

## 2011-05-31 MED ORDER — OXYTOCIN 20 UNITS IN LACTATED RINGERS INFUSION - SIMPLE
INTRAVENOUS | Status: AC
  Filled 2011-05-31: qty 1000

## 2011-05-31 MED ORDER — BENZOCAINE-MENTHOL 20-0.5 % EX AERO
INHALATION_SPRAY | CUTANEOUS | Status: AC
  Filled 2011-05-31: qty 56

## 2011-05-31 MED ORDER — DIPHENHYDRAMINE HCL 50 MG/ML IJ SOLN: 12.5000 mg | INTRAMUSCULAR | Status: DC | PRN

## 2011-05-31 MED ORDER — OXYTOCIN 20 UNITS IN LACTATED RINGERS INFUSION - SIMPLE
125.0000 mL/h | INTRAVENOUS | Status: DC
  Administered 2011-05-31: 125 mL/h via INTRAVENOUS

## 2011-05-31 MED ORDER — ONDANSETRON HCL 4 MG PO TABS: 4.0000 mg | ORAL_TABLET | ORAL | Status: DC | PRN

## 2011-05-31 MED ORDER — OXYCODONE-ACETAMINOPHEN 5-325 MG PO TABS
1.0000 | ORAL_TABLET | ORAL | Status: DC | PRN
  Administered 2011-05-31 – 2011-06-01 (×2): 1 via ORAL
  Filled 2011-05-31 (×2): qty 1

## 2011-05-31 MED ORDER — DIPHENHYDRAMINE HCL 25 MG PO CAPS: 25.0000 mg | ORAL_CAPSULE | Freq: Four times a day (QID) | ORAL | Status: DC | PRN

## 2011-05-31 MED ORDER — HYDROXYZINE PAMOATE 50 MG PO CAPS
50.0000 mg | ORAL_CAPSULE | Freq: Every evening | ORAL | Status: DC | PRN
  Filled 2011-05-31: qty 1

## 2011-05-31 MED ORDER — OXYTOCIN 20 UNITS IN LACTATED RINGERS INFUSION - SIMPLE: 125.0000 mL/h | INTRAVENOUS | Status: DC

## 2011-05-31 MED ORDER — FENTANYL 2.5 MCG/ML BUPIVACAINE 1/10 % EPIDURAL INFUSION (WH - ANES)
INTRAMUSCULAR | Status: AC
  Filled 2011-05-31: qty 60

## 2011-05-31 MED ORDER — ONDANSETRON HCL 4 MG/2ML IJ SOLN: 4.0000 mg | INTRAMUSCULAR | Status: DC | PRN

## 2011-05-31 MED ORDER — LIDOCAINE HCL 1.5 % IJ SOLN
INTRAMUSCULAR | Status: DC | PRN
  Administered 2011-05-31: 4 mL
  Administered 2011-05-31: 4 mL via INTRADERMAL

## 2011-05-31 MED ORDER — ZOLPIDEM TARTRATE 5 MG PO TABS: 5.0000 mg | ORAL_TABLET | Freq: Every evening | ORAL | Status: DC | PRN

## 2011-05-31 NOTE — Anesthesia Preprocedure Evaluation (Signed)
Anesthesia Evaluation  Name, MR# and DOB Patient awake  General Assessment Comment  Reviewed: Allergy & Precautions, H&P  and Patient's Chart, lab work & pertinent test results  Airway Mallampati: IV TM Distance: >3 FB Neck ROM: Full    Dental No notable dental hx (+) Teeth Intact   Pulmonary  Sleep apnea: Snores ? undiagnosed OSA.  clear to auscultation  pulmonary exam normal   Cardiovascular +CHF (Occurred 4 days Post Partum with last child. w/u negative. Told due to fluid overload from pregnancy) Regular Normal   Neuro/Psych  GI/Hepatic/Renal negative Liver ROS, and negative Renal ROS (+)  GERD (on Rx) Controlled     Endo/Other  Negative Endocrine ROS (+)  Morbid obesity Abdominal (+) obese,   Musculoskeletal negative musculoskeletal ROS (+)  Hematology negative hematology ROS (+)   Peds  Reproductive/Obstetrics (+) Pregnancy   Anesthesia Other Findings             Anesthesia Physical Anesthesia Plan  ASA: III  Anesthesia Plan: Epidural   Post-op Pain Management:    Induction:   Airway Management Planned: Natural Airway  Additional Equipment:   Intra-op Plan:   Post-operative Plan:   Informed Consent: I have reviewed the patients History and Physical, chart, labs and discussed the procedure including the risks, benefits and alternatives for the proposed anesthesia with the patient or authorized representative who has indicated his/her understanding and acceptance.     Plan Discussed with: Anesthesiologist (AP)  Anesthesia Plan Comments:         Anesthesia Quick Evaluation

## 2011-05-31 NOTE — Progress Notes (Signed)
.  Subjective: C/O increased pain with ctx, requesting pain meds   Objective: BP 99/55  Pulse 92  Temp(Src) 97.9 F (36.6 C) (Oral)  Resp 20  Ht 5\' 7"  (1.702 m)  Wt 128.822 kg (284 lb)  BMI 44.48 kg/m2   FHT:  FHR: 140 bpm, variability: moderate,  accelerations:  Present,  decelerations:  Present variables UC:   regular, every 3-5 minutes SVE:   Dilation: Fingertip Effacement (%): 50 Station: -1 Exam by:: Emilee Hero, CNM    Assessment / Plan: IOL  Cervidil removed, will start pitocin  Fetal Wellbeing:  Category I Pain Control:  nubain, plans epidural  Dr Pennie Rushing updated   [name] 05/31/2011, 9:13 AM

## 2011-05-31 NOTE — Progress Notes (Signed)
LR with 20 units Pitocin started per CNM. Unable to chart intake under IV at time.

## 2011-05-31 NOTE — Progress Notes (Signed)
Called CNM for increased lochia-moderate to heavy. Pt able to void but changing pad every 15-20 minutes. Fundus mostly u/e but slightly deviated to the right, firm. Started LR with 20units Pitocin and gave IM 10 units Pitocin per orders. Will monitor.

## 2011-05-31 NOTE — Progress Notes (Addendum)
   Subjective: Called to evaluate tracing due to series of mild variables.  Objective: BP 123/62  Pulse 92  Temp(Src) 98 F (36.7 C) (Oral)  Resp 20  Ht 5\' 7"  (1.702 m)  Wt 128.822 kg (284 lb)  BMI 44.48 kg/m2   I/O this shift: In: 3 [I.V.:3] Out: -   FHT:  FHR: 140 bpm, variability: moderate,  accelerations:  Present,  decelerations:  Present Mild Variables. UC:   irregular, every 1-5 minutes, mild Run of mild variables, with patient on back. Positive fetal movement with audible accels noted. Resolved with position change.  Assessment / Plan: IV fluid bolus Position change. Will maintain Cervidil at present. Close observation of fetal status.         Sarinity Dicicco L 05/31/2011, 2:52 AM

## 2011-05-31 NOTE — Progress Notes (Signed)
Delivery Note At 2:45 PM a viable and healthy female was delivered via Vaginal, Spontaneous Delivery (Presentation: OA ;  ).  APGAR: 9, 10; weight 7 lb 12.3 oz (3525 g).   Placenta status: Intact, Pathology.  Cord: 3 vessels with the following complications: Marginal Cord Insertion  Anesthesia: Epidural  Episiotomy: None Lacerations: 2nd degree;Periurethral Suture Repair: 2.0 3.0 vicryl rapide Est. Blood Loss (mL): 400  Mom to postpartum.  Baby to nursery-stable. Baby name "Lindsay Aguilar" Dr Pennie Rushing notified  [name] 05/31/2011, 4:42 PM

## 2011-05-31 NOTE — Progress Notes (Signed)
   Slept well after Nubain. BP 139/82 FHR baseline 120-130,. Accels noted.  Occasional mild variables. UCs irregular, q1-4 min, mild  Will continue to observe.   Plan to remove Cervidil at 9:15am or before prn.  Nigel Bridgeman, CNM

## 2011-05-31 NOTE — Anesthesia Procedure Notes (Signed)
Epidural Patient location during procedure: OB Start time: 05/31/2011 10:52 AM  Staffing Anesthesiologist: Kaydyn Chism A. Performed by: anesthesiologist   Preanesthetic Checklist Completed: patient identified, site marked, surgical consent, pre-op evaluation, timeout performed, IV checked, risks and benefits discussed and monitors and equipment checked  Epidural Patient position: sitting Prep: site prepped and draped and DuraPrep Patient monitoring: continuous pulse ox and blood pressure Approach: midline Injection technique: LOR air  Needle Needle type: Tuohy  Needle gauge: 17 G Needle length: 9 cm Catheter type: closed end flexible Catheter size: 19 Gauge Test dose: negative and 1.5% lidocaine  Assessment Events: blood not aspirated, injection not painful, no injection resistance, negative IV test and no paresthesia  Additional Notes Difficult due to MO & poor positioning as well as poor landmarks

## 2011-05-31 NOTE — Progress Notes (Signed)
.  Subjective: Comfortable after epidural   Objective: BP 95/60  Pulse 95  Temp(Src) 98 F (36.7 C) (Oral)  Resp 20  Ht 5\' 7"  (1.702 m)  Wt 128.822 kg (284 lb)  BMI 44.48 kg/m2  SpO2 98%   FHT:  FHR: 130 bpm, variability: moderate,  accelerations:  Present,  decelerations:  Present variables UC:   regular, every 3-4 minutes SVE:   Dilation: 3 Effacement (%): 100 Station: -1 Exam by:: shelly lilliard cnm AROM clear fluid FSE placed without difficulty  Pitocin on 4mu  Assessment / Plan: Induction of labor due to elective,  progressing well on pitocin  Fetal Wellbeing:  Category I Pain Control:  Epidural     Lindsay Aguilar M 05/31/2011, 12:04 PM

## 2011-05-31 NOTE — Progress Notes (Signed)
  Subjective: Came by to check on patient from episode of moderate/heavy bleeding.  IV had infiltrated, was restarted, running at 125 cc/hr of LR with 20u pitocin.  Also received 10 u pitocin IM.  Feeling fine, no syncope or dizziness.  Bleeding minimal now.  Objective: Blood pressure 118/79, pulse 81, temperature 99.4 F (37.4 C), temperature source Oral, resp. rate 22, height 5\' 7"  (1.702 m), weight 128.822 kg (284 lb), SpO2 99.00%, unknown if currently breastfeeding.  Physical Exam:  General: alert Lochia: appropriate Uterine Fundus: firm Incision: healing well DVT Evaluation: No evidence of DVT seen on physical exam.   Basename 05/30/11 2132  HGB 9.7*  HCT 30.3*    Assessment/Plan: Continue to observe.   Check CBC in am Will consult in am regarding need for diuretic pp (received Lasix after both previous deliveries, had pp pulmonary edema after 1st delivery).   LOS: 1 day   Refael Fulop L 05/31/2011, 9:32 PM

## 2011-05-31 NOTE — Progress Notes (Signed)
Post Partum Day 0 Subjective: Called to see patient with moderate/heavy lochia.  Denies syncope or dizziness.  Has voided without difficulty several times.  Now reporting increased cramping since IV infusion of pitocin restarted.  Getting ready to breastfeed.  Objective: Blood pressure 110/76, pulse 85, temperature 98.5 F (36.9 C), temperature source Oral, resp. rate 18, height 5\' 7"  (1.702 m), weight 128.822 kg (284 lb), SpO2 98.00%, unknown if currently breastfeeding.  Physical Exam:  General: alert Lochia: Moderate/heavy, no clots expressed on fundal massage Uterine Fundus: firm, at umbilicus, slightly deviated to right. Incision: healing well DVT Evaluation: No evidence of DVT seen on physical exam.     Basename 05/30/11 2132  HGB 9.7*  HCT 30.3*    Assessment/Plan: Restart IV infusion with 20 units/1000cc LR, for 500 cc bolus, then 125cc/hr through night.  Will re-evalute within next hour for need for further treatment.  NOTE:  Current IV will not infuse bolus, but will maintain 125cc/hr.  Patient has been difficult IV start, with multiple attempts, and had to have new IV start before epidural today by anesthesia.  Will give 10u pitocin IM, continue 125 cc/hr via IV, and re-evalute in the next hour after IM pitocin administered.    LOS: 1 day   Jasper Ruminski L 05/31/2011, 7:21 PM

## 2011-05-31 NOTE — Progress Notes (Signed)
  Patient requesting pain medication.  Reports + FM.  : VSS. Afebrile EFM:  Baseline 125-135, reassuring.  Negative spontaneous CST. Cervidil still in place. Cx still posterior, no advancement of dilation.  50%, vtx -1. UCs irregular, q1-3 minutes, mild.  Options for pain medication reviewed.  Will maintain cervidil at present. Will give Nubain IV. Patient plans epidural as labor advances.  Nigel Bridgeman, CNM

## 2011-06-01 LAB — CBC
HCT: 25.2 % — ABNORMAL LOW (ref 36.0–46.0)
Hemoglobin: 7.8 g/dL — ABNORMAL LOW (ref 12.0–15.0)
MCHC: 31 g/dL (ref 30.0–36.0)

## 2011-06-01 MED ORDER — PNEUMOCOCCAL VAC POLYVALENT 25 MCG/0.5ML IJ INJ
0.5000 mL | INJECTION | Freq: Once | INTRAMUSCULAR | Status: AC
  Administered 2011-06-01: 0.5 mL via INTRAMUSCULAR
  Filled 2011-06-01: qty 0.5

## 2011-06-01 NOTE — Progress Notes (Signed)
Subjective  No concerns. Breastfeeding without difficulty. Lochia tapering. Afterpains mild.  Ambulatory without orthostatic sx. Plans POP for contraception. Has help at home. Desires early discharge but due to time of delivery and previous hx. Of Pulmonary Edema PP w/previous pregnancy enc. Pt to stay until tomorrow am. Pt and s.o. Agree.   Objective  Filed Vitals:   06/01/11 0500  BP: 105/65  Pulse: 87  Temp: 98.1 F (36.7 C)  Resp: 20  Gen: A,A, 0x3 HRR, No M,R,G LCTAB  Breasts: soft  ZOX:WRUE, nontender, fundus firm, @ U+1, pt states needs to void.   Ext: nontender calves, 1+ dependent edema Lab Results  Component Value Date   WBC 13.5* 06/01/2011   HGB 7.8* 06/01/2011   HCT 25.2* 06/01/2011   MCV 75.9* 06/01/2011   PLT 216 06/01/2011       Assessment   1. G3P3  PPD#1 uncomplicated NSVD doing well  2. Anemia (asymptomatic)  Plan  Continue with Routine PP orders Supportive care for Nursing D/C home in AM

## 2011-06-01 NOTE — Progress Notes (Signed)
UR chart review completed.  

## 2011-06-01 NOTE — Progress Notes (Signed)
Mom states breastfeeding going well she denies soreness. States she doesn't need any assistance.

## 2011-06-02 MED ORDER — HYDROCHLOROTHIAZIDE 25 MG PO TABS: 25.0000 mg | ORAL_TABLET | Freq: Every day | ORAL | Status: DC

## 2011-06-02 MED ORDER — IBUPROFEN 600 MG PO TABS: 600.0000 mg | ORAL_TABLET | Freq: Four times a day (QID) | ORAL | Status: AC

## 2011-06-02 MED ORDER — NORETHINDRONE 0.35 MG PO TABS: 1.0000 | ORAL_TABLET | Freq: Every day | ORAL | Status: DC

## 2011-06-02 MED ORDER — HYDROCHLOROTHIAZIDE 25 MG PO TABS
25.0000 mg | ORAL_TABLET | Freq: Every day | ORAL | Status: DC
  Administered 2011-06-02: 25 mg via ORAL
  Filled 2011-06-02: qty 1

## 2011-06-02 NOTE — Discharge Summary (Signed)
Obstetric Discharge Summary Reason for Admission: induction of labor Prenatal Procedures: ultrasound Intrapartum Procedures: spontaneous vaginal delivery Postpartum Procedures: none Complications-Operative and Postpartum: 2nd degree perineal laceration  Hemoglobin  Date Value Range Status  06/01/2011 7.8* 12.0-15.0 (g/dL) Final     DELTA CHECK NOTED     REPEATED TO VERIFY     HCT  Date Value Range Status  06/01/2011 25.2* 36.0-46.0 (%) Final    Discharge Diagnoses: Term Pregnancy-delivered and anemia  Discharge Information: Date: 06/02/2011 Activity: unrestricted Diet: routine Medications: Ibuprophen and HCTZ Condition: stable Instructions: refer to practice specific booklet Discharge to: home   Newborn Data: Live born  Information for the patient's newborn:  [name] Girl Nygeria [[id number]]  female ; APGAR 9/10 , ; weight 7+12 ;  Home with mother.  Elaysia Devargas L 06/02/2011, 8:32 AM

## 2011-06-02 NOTE — Anesthesia Postprocedure Evaluation (Signed)
  Anesthesia Post-op Note  Patient: Lindsay Aguilar  Procedure(s) Performed: Epidural  Patient Location: PACU and Labor and Delivery  Anesthesia Type: Epidural  Level of Consciousness: awake, alert  and oriented  Airway and Oxygen Therapy: Patient Spontanous Breathing  Post-op Pain:   Post-op Assessment: Post-op Vital signs reviewed  Post-op Vital Signs: Reviewed and stable  Complications: No apparent anesthesia complications

## 2011-06-02 NOTE — Addendum Note (Signed)
Addendum  created 06/02/11 0809 by Tyrone Apple. Lindsay Aguilar   Modules edited:Notes Section

## 2011-06-02 NOTE — Progress Notes (Signed)
Post Partum Day 2 Subjective: no complaints  Objective: Blood pressure 102/64, pulse 73, temperature 98 F (36.7 C), temperature source Oral, resp. rate 18, height 5\' 7"  (1.702 m), weight 128.822 kg (284 lb), SpO2 99.00%, unknown if currently breastfeeding.  Physical Exam:  General: alert Lochia: appropriate Uterine Fundus: firm Incision: healing well DVT Evaluation: No evidence of DVT seen on physical exam.   Basename 06/01/11 0518 05/30/11 2132  HGB 7.8* 9.7*  HCT 25.2* 30.3*    Assessment/Plan: Discharge home Rx Motrin, Micronor, HCTZ x 1 week. Declined transfusion. Will continue FE   LOS: 3 days   Lindsay Aguilar L 06/02/2011, 8:36 AM

## 2011-06-02 NOTE — Progress Notes (Signed)
Reviewed engorgement tx if needed . Mom declined hand pump , has double electric pump at home .

## 2011-06-03 NOTE — Discharge Summary (Signed)
  Medical screening examination/treatment/procedure(s) were performed by non-physician practitioner and as supervising physician I was immediately available for consultation/collaboration.     

## 2011-06-04 NOTE — H&P (Signed)
:   Here for induction due to perception of decreased FM over last several days and repetitive BPP results of 6/8, history of pp pulmonary edema.  Denies HA, visual symptoms, epigastric pain.  Does report increased swelling over last 1-2 weeks.  Objective: BP 121/98  Pulse 105  Temp(Src) 98.5 F (36.9 C) (Oral)  Resp 20  Ht 5\' 7"  (1.702 m)  Wt 128.822 kg (284 lb)  BMI 44.48 kg/m2      FHT:  FHR: 150 bpm, variability: moderate,  accelerations:  Present,  decelerations:  Absent except for occasional mild variables. UC:   occasional SVE:   Dilation: Fingertip Effacement (%): Thick Station: -2 Exam by:: Emilee Hero, CNM External os 1 cm, internal os FT.  Labs: Lab Results  Component Value Date   WBC 10.0 03/23/2011   HGB 10.0* 03/23/2011   HCT 32.3* 03/23/2011   MCV 73.7* 03/23/2011   PLT 238 03/23/2011    Assessment / Plan: IUP at 39 weeks For induction due to decreased FM, BPP 6/8 x 2 this week. History of pp pulmonary edema GBS negative. Unfavorable cervix  Draw PIH labs Check UA for protein Cervidil tonight, pitocin in am Benadryl for sleep (pt reports Ambien doesn't work. R&B of induction reviewed with patient, including failure of method, prolonged labor, need for C/S.  Patient and husband seem to understand these risks and wish to proceed. Had consulted with Dr. Pennie Rushing yesterday regarding this plan.  Anticipated MOD:  NSVD  LATHAM,VICKI L 05/30/2011, 9:16 PM

## 2011-06-05 NOTE — Progress Notes (Signed)
  Subjective:    Patient ID: Lindsay Aguilar, female    DOB: 28-Mar-1981, 30 y.o.   MRN: [id number]  HPI Lindsay Aguilar is an 30 y.o. G3P2 at 81 weeks who presented to [facility]U for NST due to [facility] 6/8 at office.  She is very anxious regarding the status of the baby, since her previous BPP was also 6/8.  She denies UCs, d/c, or bleeding, and reports + FM.  Pregnancy remarkable for:  Asthma  Elevated BMI  Hx pp pulmonary edema  Itching during pregnancy, with negative w/u for elevated bile acids.  ASCUS on pap with negative HPV  GBS negative  Mild anemia   OB History: G3, P2  Patient was induced at 41 weeks with both previous babies.  Had pp pulmonary edema after last delivery, likely due to mildly elevated BP   LMP: 08/29/2010                                                                                      Review of Systems Denies HA, visual sx, epigastric pain.  No other complaints.    Objective:   Physical Exam: VSS, afebrile. EFM--Reactive, with irregular mild contractions noted. Cervix--Ext os 1 cm, internal os FT, long, vtx -2 Therefore, final assessment is BPP 8/10.       Assessment & Plan:  Consulted with Dr. Pennie Rushing regarding patient status and strong desire for induction. R&B of induction were reviewed with the patient and her husband, and they both wish to be scheduled for induction ASAP due to their fear regarding fetal well-being. She is now scheduled for induction tomorrow night at 7:30pm for cervical ripening, then pitocin in the am.  Nigel Bridgeman, CNM, [place]

## 2011-06-19 NOTE — ED Provider Notes (Signed)
This is a note referring to the "Progress Note" prepared by Nigel Bridgeman on 06/05/2011 for the 05/29/2011 MAU encounter. This will fullfill the deficiency in my inbasket.  Dierdre Forth

## 2011-08-10 LAB — CBC
HCT: 28.3 — ABNORMAL LOW
MCHC: 33.8
MCV: 77.8 — ABNORMAL LOW
MCV: 78.2
Platelets: 230
Platelets: 236
RBC: 4.16
RDW: 17.1 — ABNORMAL HIGH
RDW: 17.5 — ABNORMAL HIGH

## 2011-08-10 LAB — COMPREHENSIVE METABOLIC PANEL
ALT: 11
Albumin: 2.1 — ABNORMAL LOW
Alkaline Phosphatase: 131 — ABNORMAL HIGH
Potassium: 3.8
Sodium: 138
Total Protein: 4.4 — ABNORMAL LOW

## 2011-08-10 LAB — RPR: RPR Ser Ql: NONREACTIVE

## 2011-08-24 LAB — POCT RAPID STREP A: Streptococcus, Group A Screen (Direct): NEGATIVE

## 2012-04-25 ENCOUNTER — Telehealth: Payer: Self-pay | Admitting: Obstetrics and Gynecology

## 2012-04-25 NOTE — Telephone Encounter (Signed)
TC to pt. LM to return call regarding message. 

## 2012-04-25 NOTE — Telephone Encounter (Signed)
Triage/cht received 

## 2012-10-28 ENCOUNTER — Emergency Department (HOSPITAL_COMMUNITY): Payer: Self-pay

## 2012-10-28 ENCOUNTER — Encounter (HOSPITAL_COMMUNITY): Payer: Self-pay | Admitting: Emergency Medicine

## 2012-10-28 ENCOUNTER — Emergency Department (HOSPITAL_COMMUNITY)
Admission: EM | Admit: 2012-10-28 | Discharge: 2012-10-29 | Disposition: A | Discharge status: Home / Self Care | Payer: Self-pay | Attending: Emergency Medicine | Admitting: Emergency Medicine

## 2012-10-28 DIAGNOSIS — Z79899 Other long term (current) drug therapy: Secondary | ICD-10-CM | POA: Insufficient documentation

## 2012-10-28 DIAGNOSIS — Z862 Personal history of diseases of the blood and blood-forming organs and certain disorders involving the immune mechanism: Secondary | ICD-10-CM | POA: Insufficient documentation

## 2012-10-28 DIAGNOSIS — J40 Bronchitis, not specified as acute or chronic: Secondary | ICD-10-CM

## 2012-10-28 DIAGNOSIS — Z87891 Personal history of nicotine dependence: Secondary | ICD-10-CM | POA: Insufficient documentation

## 2012-10-28 DIAGNOSIS — I10 Essential (primary) hypertension: Secondary | ICD-10-CM | POA: Insufficient documentation

## 2012-10-28 DIAGNOSIS — J45901 Unspecified asthma with (acute) exacerbation: Secondary | ICD-10-CM

## 2012-10-28 DIAGNOSIS — J209 Acute bronchitis, unspecified: Secondary | ICD-10-CM | POA: Insufficient documentation

## 2012-10-28 DIAGNOSIS — Z9889 Other specified postprocedural states: Secondary | ICD-10-CM | POA: Insufficient documentation

## 2012-10-28 DIAGNOSIS — Z8679 Personal history of other diseases of the circulatory system: Secondary | ICD-10-CM | POA: Insufficient documentation

## 2012-10-28 MED ORDER — ALBUTEROL SULFATE (5 MG/ML) 0.5% IN NEBU
2.5000 mg | INHALATION_SOLUTION | Freq: Once | RESPIRATORY_TRACT | Status: AC
  Administered 2012-10-28: 2.5 mg via RESPIRATORY_TRACT
  Filled 2012-10-28: qty 0.5

## 2012-10-28 MED ORDER — IPRATROPIUM BROMIDE 0.02 % IN SOLN
0.5000 mg | Freq: Once | RESPIRATORY_TRACT | Status: AC
Start: 2012-10-28 — End: 2012-10-28
  Administered 2012-10-28: 0.5 mg via RESPIRATORY_TRACT
  Filled 2012-10-28: qty 2.5

## 2012-10-28 NOTE — ED Notes (Signed)
Erin respiratory called for breathing treatment

## 2012-10-28 NOTE — ED Notes (Signed)
Patient transported to X-ray 

## 2012-10-28 NOTE — ED Notes (Signed)
Patient returned from X-ray 

## 2012-10-28 NOTE — ED Provider Notes (Addendum)
History     CSN: [id number]  Arrival date & time 10/28/12  2053   First MD Initiated Contact with Patient 10/28/12 2118      Chief Complaint  Patient presents with  . Cough  . Shortness of Breath    (Consider location/radiation/quality/duration/timing/severity/associated sxs/prior treatment) HPI Comments: Royetta Crochet int states she had a URI with her children several weeks ago, but has had persistent cough since, not improved with use of inhaler.  Denies and leg swelling- states only had CHF with pregnancies   Patient is a 31 y.o. female presenting with cough and shortness of breath. The history is provided by the patient.  Cough This is a recurrent problem. The current episode started 2 days ago. The problem occurs constantly. The problem has not changed since onset.The cough is non-productive. There has been no fever. Associated symptoms include shortness of breath. Pertinent negatives include no rhinorrhea and no sore throat.  Shortness of Breath  Associated symptoms include cough and shortness of breath. Pertinent negatives include no rhinorrhea and no sore throat.    Past Medical History  Diagnosis Date  . Asthma   . 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 Hypertension   . Anemia     only with pregnancy    Past Surgical History  Procedure Date  . Cholecystectomy 04/2008  . Wisdom tooth extraction 1997    Family History  Problem Relation Age of Onset  . Anesthesia problems Other   . Depression Father     History  Substance Use Topics  . Smoking status: Former Smoker    Types: Cigarettes    Quit date: 11/16/2006  . Smokeless tobacco: Never Used  . Alcohol Use: No    OB History    Grav Para Term Preterm Abortions TAB SAB Ect Mult Living   3 3 3  0 0 0 0 0 0 2      Review of Systems  HENT: Negative for sore throat and rhinorrhea.   Respiratory: Positive for cough and shortness of breath.     Allergies   Amoxicillin  Home Medications   Current Outpatient Rx  Name  Route  Sig  Dispense  Refill  . ALBUTEROL 90 MCG/ACT IN AERS   Inhalation   Inhale 2 puffs into the lungs every 6 (six) hours as needed. Shortness of breath          . AZITHROMYCIN 250 MG PO TABS   Oral   Take 1 tablet (250 mg total) by mouth daily.   4 tablet   0   . PREDNISONE 10 MG PO TABS   Oral   Take 2 tablets (20 mg total) by mouth daily.   10 tablet   0     BP 115/99  Pulse 92  Temp 98 F (36.7 C) (Oral)  Resp 16  SpO2 100%  LMP 10/28/2012  Physical Exam  Constitutional: She is oriented to person, place, and time. She appears well-developed.  HENT:  Head: Normocephalic.  Eyes: Pupils are equal, round, and reactive to light.  Neck: Normal range of motion.  Cardiovascular: Normal rate.   Pulmonary/Chest: Effort normal and breath sounds normal.  Abdominal: Soft. Bowel sounds are normal.  Musculoskeletal: Normal range of motion.  Neurological: She is alert and oriented to person, place, and time.  Skin: Skin is warm. No rash noted. No pallor.    ED Course  Procedures (including critical care time)  Labs Reviewed  CBC WITH DIFFERENTIAL - Abnormal; Notable for the following:    WBC 12.6 (*)     Hemoglobin 11.9 (*)     MCV 73.3 (*)     MCH 23.3 (*)     Neutro Abs 8.0 (*)     All other components within normal limits  D-DIMER, QUANTITATIVE  POCT I-STAT, CHEM 8   Dg Chest 2 View  10/28/2012  *RADIOLOGY REPORT*  Clinical Data: Shortness of breath and cough for 7 weeks.  CHEST - 2 VIEW  Comparison: 08/13/2009  Findings: The heart size and pulmonary vascularity are normal. The lungs appear clear and expanded without focal air space disease or consolidation. No blunting of the costophrenic angles.  No pneumothorax.  Mediastinal contours appear intact.  Mild thoracic scoliosis convex towards the right.  No significant change since previous study.  IMPRESSION: No evidence of active pulmonary  disease.   Original Report Authenticated By: Burman Nieves, M.D.      1. Bronchitis   2. Asthma exacerbation       MDM   Perk negative, D Dimer neg  Elevated WBC with cough and Hx asthma will treat with antibiotic, and steroid         Arman Filter, NP 10/29/12 0132  Arman Filter, NP 11/24/12 0316  Arman Filter, NP 11/24/12 [phone]

## 2012-10-28 NOTE — ED Notes (Signed)
Pt alert, arrives from home, c/o cough and sob, onset was several days ago, denies chest pain, resp even unlabored, skin pwd, NPC noted

## 2012-10-28 NOTE — ED Notes (Signed)
Bed:WA18<BR> Expected date:<BR> Expected time:<BR> Means of arrival:<BR> Comments:<BR> Triage 1

## 2012-10-29 LAB — CBC WITH DIFFERENTIAL/PLATELET
Basophils Absolute: 0 10*3/uL (ref 0.0–0.1)
Basophils Relative: 0 % (ref 0–1)
Eosinophils Relative: 1 % (ref 0–5)
HCT: 37.4 % (ref 36.0–46.0)
Hemoglobin: 11.9 g/dL — ABNORMAL LOW (ref 12.0–15.0)
Lymphocytes Relative: 32 % (ref 12–46)
Monocytes Relative: 4 % (ref 3–12)
Neutro Abs: 8 10*3/uL — ABNORMAL HIGH (ref 1.7–7.7)
RBC: 5.1 MIL/uL (ref 3.87–5.11)
RDW: 14.5 % (ref 11.5–15.5)
WBC: 12.6 10*3/uL — ABNORMAL HIGH (ref 4.0–10.5)

## 2012-10-29 LAB — POCT I-STAT, CHEM 8
Creatinine, Ser: 0.7 mg/dL (ref 0.50–1.10)
HCT: 38 % (ref 36.0–46.0)
Hemoglobin: 12.9 g/dL (ref 12.0–15.0)
Potassium: 4 mEq/L (ref 3.5–5.1)
Sodium: 142 mEq/L (ref 135–145)

## 2012-10-29 MED ORDER — AZITHROMYCIN 250 MG PO TABS
500.0000 mg | ORAL_TABLET | Freq: Once | ORAL | Status: AC
  Administered 2012-10-29: 500 mg via ORAL
  Filled 2012-10-29: qty 2

## 2012-10-29 MED ORDER — AZITHROMYCIN 250 MG PO TABS: 250.0000 mg | ORAL_TABLET | Freq: Every day | ORAL | Status: DC

## 2012-10-29 MED ORDER — PREDNISONE 20 MG PO TABS
60.0000 mg | ORAL_TABLET | Freq: Once | ORAL | Status: AC
  Administered 2012-10-29: 60 mg via ORAL
  Filled 2012-10-29: qty 3

## 2012-10-29 MED ORDER — PREDNISONE 10 MG PO TABS: 20.0000 mg | ORAL_TABLET | Freq: Every day | ORAL | Status: DC

## 2012-10-29 MED ORDER — FLUCONAZOLE 150 MG PO TABS
150.0000 mg | ORAL_TABLET | Freq: Once | ORAL | Status: AC
  Administered 2012-10-29: 150 mg via ORAL
  Filled 2012-10-29: qty 1

## 2012-10-29 NOTE — ED Provider Notes (Signed)
Medical screening examination/treatment/procedure(s) were performed by non-physician practitioner and as supervising physician I was immediately available for consultation/collaboration.]  Sunnie Nielsen, MD 10/29/12 [phone]

## 2012-11-24 NOTE — ED Provider Notes (Signed)
Medical screening examination/treatment/procedure(s) were performed by non-physician practitioner and as supervising physician I was immediately available for consultation/collaboration.  Tia Gelb R. Metzli Pollick, MD 11/25/12 0000 

## 2013-02-17 ENCOUNTER — Encounter (HOSPITAL_COMMUNITY): Payer: Self-pay | Admitting: *Deleted

## 2013-02-17 ENCOUNTER — Emergency Department (HOSPITAL_COMMUNITY)
Admission: EM | Admit: 2013-02-17 | Discharge: 2013-02-17 | Disposition: A | Discharge status: Home / Self Care | Payer: PRIVATE HEALTH INSURANCE | Attending: Emergency Medicine | Admitting: Emergency Medicine

## 2013-02-17 DIAGNOSIS — J45909 Unspecified asthma, uncomplicated: Secondary | ICD-10-CM | POA: Insufficient documentation

## 2013-02-17 DIAGNOSIS — I10 Essential (primary) hypertension: Secondary | ICD-10-CM | POA: Insufficient documentation

## 2013-02-17 DIAGNOSIS — M26609 Unspecified temporomandibular joint disorder, unspecified side: Secondary | ICD-10-CM | POA: Insufficient documentation

## 2013-02-17 DIAGNOSIS — I509 Heart failure, unspecified: Secondary | ICD-10-CM | POA: Insufficient documentation

## 2013-02-17 DIAGNOSIS — Z87891 Personal history of nicotine dependence: Secondary | ICD-10-CM | POA: Insufficient documentation

## 2013-02-17 DIAGNOSIS — Z862 Personal history of diseases of the blood and blood-forming organs and certain disorders involving the immune mechanism: Secondary | ICD-10-CM | POA: Insufficient documentation

## 2013-02-17 DIAGNOSIS — M26629 Arthralgia of temporomandibular joint, unspecified side: Secondary | ICD-10-CM

## 2013-02-17 NOTE — ED Notes (Signed)
Pt from home with reports of left jaw pain that started yesterday. 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

## 2013-02-17 NOTE — ED Provider Notes (Signed)
History     CSN: [id number]  Arrival date & time 02/17/13  1026   First MD Initiated Contact with Patient 02/17/13 1041      Chief Complaint  Patient presents with  . Jaw Pain    Left    (Consider location/radiation/quality/duration/timing/severity/associated sxs/prior treatment) HPI Pt presenting with c/o left jaw pain.  Pt states she has a hx of TMJ several years ago.  She has been under increased stress with GM dying- funeral was yesterday, states she must be clenching her jaw at night causing increased pain in her jaw.  No fever, no sore throat, no difficulty swallowing or breathing.  No neck pain.  No trauma.  No dislocation of jaw.  There are no other associated systemic symptoms, there are no other alleviating or modifying factors.   Past Medical History  Diagnosis Date  . Asthma   . 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 Hypertension   . Anemia     only with pregnancy    Past Surgical History  Procedure Laterality Date  . Cholecystectomy  04/2008  . Wisdom tooth extraction  1997    Family History  Problem Relation Age of Onset  . Anesthesia problems Other   . Depression Father     History  Substance Use Topics  . Smoking status: Former Smoker    Types: Cigarettes    Quit date: 11/16/2006  . Smokeless tobacco: Never Used  . Alcohol Use: No    OB History   Grav Para Term Preterm Abortions TAB SAB Ect Mult Living   3 3 3  0 0 0 0 0 0 2      Review of Systems ROS reviewed and all otherwise negative except for mentioned in HPI  Allergies  Amoxicillin  Home Medications   Current Outpatient Rx  Name  Route  Sig  Dispense  Refill  . cyclobenzaprine (FLEXERIL) 10 MG tablet   Oral   Take 10 mg by mouth 3 (three) times daily as needed for muscle spasms.           BP 106/64  Pulse 77  Temp(Src) 98 F (36.7 C)  Resp 16  SpO2 99%  LMP 02/13/2013 Vitals reviewed Physical Exam Physical  Examination: General appearance - alert, well appearing, and in no distress Mental status - alert, oriented to person, place, and time Eyes - no scleral icterus, no conjunctival injection Mouth - mucous membranes moist, pharynx normal without lesions, no gingival erythema, no swelling under tongue, ttp over left TMJ joint, no maloclusion Neck - supple, no significant adenopathy Chest - clear to auscultation, no wheezes, rales or rhonchi, symmetric air entry Heart - normal rate, regular rhythm, normal S1, S2, no murmurs, rubs, clicks or gallops Extremities - peripheral pulses normal, no pedal edema, no clubbing or cyanosis Skin - normal coloration and turgor, no rashes  ED Course  Procedures (including critical care time)  Labs Reviewed - No data to display No results found.   1. 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  Advised ibuprofen, also has been taking flexeril at home.  Advised dental follow up if symptoms persist.         Ethelda Chick, MD 02/17/13 1236

## 2013-06-29 ENCOUNTER — Other Ambulatory Visit: Payer: Self-pay | Admitting: Obstetrics and Gynecology

## 2013-06-29 DIAGNOSIS — N644 Mastodynia: Secondary | ICD-10-CM

## 2013-07-18 ENCOUNTER — Ambulatory Visit
Admission: RE | Admit: 2013-07-18 | Discharge: 2013-07-18 | Disposition: A | Discharge status: Home / Self Care | Payer: PRIVATE HEALTH INSURANCE | Source: Ambulatory Visit | Attending: Obstetrics and Gynecology | Admitting: Obstetrics and Gynecology

## 2013-07-18 DIAGNOSIS — N644 Mastodynia: Secondary | ICD-10-CM

## 2013-12-01 ENCOUNTER — Emergency Department (HOSPITAL_COMMUNITY)
Admission: EM | Admit: 2013-12-01 | Discharge: 2013-12-01 | Disposition: A | Discharge status: Home / Self Care | Payer: 59 | Attending: Emergency Medicine | Admitting: Emergency Medicine

## 2013-12-01 ENCOUNTER — Encounter (HOSPITAL_COMMUNITY): Payer: Self-pay | Admitting: Emergency Medicine

## 2013-12-01 DIAGNOSIS — Z862 Personal history of diseases of the blood and blood-forming organs and certain disorders involving the immune mechanism: Secondary | ICD-10-CM | POA: Insufficient documentation

## 2013-12-01 DIAGNOSIS — I10 Essential (primary) hypertension: Secondary | ICD-10-CM | POA: Insufficient documentation

## 2013-12-01 DIAGNOSIS — Z880 Allergy status to penicillin: Secondary | ICD-10-CM | POA: Insufficient documentation

## 2013-12-01 DIAGNOSIS — R51 Headache: Secondary | ICD-10-CM | POA: Insufficient documentation

## 2013-12-01 DIAGNOSIS — H5789 Other specified disorders of eye and adnexa: Secondary | ICD-10-CM | POA: Insufficient documentation

## 2013-12-01 DIAGNOSIS — R519 Headache, unspecified: Secondary | ICD-10-CM

## 2013-12-01 DIAGNOSIS — J45909 Unspecified asthma, uncomplicated: Secondary | ICD-10-CM | POA: Insufficient documentation

## 2013-12-01 DIAGNOSIS — Z87891 Personal history of nicotine dependence: Secondary | ICD-10-CM | POA: Insufficient documentation

## 2013-12-01 DIAGNOSIS — I509 Heart failure, unspecified: Secondary | ICD-10-CM | POA: Insufficient documentation

## 2013-12-01 MED ORDER — HYDROCODONE-ACETAMINOPHEN 5-325 MG PO TABS
2.0000 | ORAL_TABLET | ORAL | Status: DC | PRN
Start: 2013-12-01 — End: 2014-01-04

## 2013-12-01 MED ORDER — KETOROLAC TROMETHAMINE 60 MG/2ML IM SOLN
30.0000 mg | Freq: Once | INTRAMUSCULAR | Status: AC
  Administered 2013-12-01: 30 mg via INTRAMUSCULAR
  Filled 2013-12-01: qty 2

## 2013-12-01 MED ORDER — ONDANSETRON HCL 4 MG PO TABS: 4.0000 mg | ORAL_TABLET | Freq: Four times a day (QID) | ORAL | Status: DC

## 2013-12-01 NOTE — ED Notes (Signed)
Pt complains of pressure behind right eye and eye tearing.  Pt denies vision changes or weakness to either side of body.  Denies n/v.

## 2013-12-01 NOTE — Discharge Instructions (Signed)

## 2013-12-01 NOTE — ED Notes (Signed)
Pt reports cutting out caffeine and developing a headache.  Pt reports not getting better and now has pressure behind right eye and eye has been draining tears. Denies n/v

## 2013-12-01 NOTE — ED Provider Notes (Signed)
Medical screening examination/treatment/procedure(s) were performed by non-physician practitioner and as supervising physician I was immediately available for consultation/collaboration.  EKG Interpretation   None        Roneshia Drew R. Kaniya Trueheart, MD 12/01/13 1505 

## 2013-12-01 NOTE — ED Provider Notes (Signed)
CSN: [id number]     Arrival date & time 12/01/13  1149 History  This chart was scribed for non-physician practitioner Fayrene HelperBowie Nhan Qualley, PA-C working with Juliet RudeNathan R. Rubin PayorPickering, MD by Leone PayorSonum Patel, ED Scribe. This patient was seen in room WTR5/WTR5 and the patient's care was started at 1149.    Chief Complaint  Patient presents with  . Headache    The history is provided by the patient. No language interpreter was used.    HPI Comments: Lindsay Aguilar is a 33 y.o. female who presents to the Emergency Department complaining of 5 days of gradual onset, gradually worsening, constant HA. Pt states she has stopped drinking caffeine products last week, a few days prior to the onset of the HA. She describes the pain as sharp and rates it as 8/10. She states this morning the HA moved to the right temple and behind the right eye. She states certain eye movements worsen the pain in the right eye. She denies having a history of migraines. She has tried tylenol and ibuprofen without relief. She also drank 1 pepsi without relief. She wears contact lenses and states she has not taken them out. She denies fever, chills, rhinorrhea, cough, visual disturbances, numbness, paresthesia, nausea, vomiting, photophobia, phonophobia.   Past Medical History  Diagnosis Date  . Asthma   . 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 Hypertension   . Anemia     only with pregnancy   Past Surgical History  Procedure Laterality Date  . Cholecystectomy  04/2008  . Wisdom tooth extraction  1997   Family History  Problem Relation Age of Onset  . Anesthesia problems Other   . Depression Father    History  Substance Use Topics  . Smoking status: Former Smoker    Types: Cigarettes    Quit date: 11/16/2006  . Smokeless tobacco: Never Used  . Alcohol Use: No   OB History   Grav Para Term Preterm Abortions TAB SAB Ect Mult Living   3 3 3  0 0 0 0 0 0 2     Review of Systems   Constitutional: Negative for fever and chills.  HENT: Negative for congestion, rhinorrhea and sore throat.   Eyes: Positive for discharge (tearing). Negative for photophobia and visual disturbance.  Respiratory: Negative for cough.   Gastrointestinal: Negative for nausea and vomiting.  Neurological: Positive for headaches. Negative for weakness and numbness.    Allergies  Amoxicillin  Home Medications   Current Outpatient Rx  Name  Route  Sig  Dispense  Refill  . acetaminophen (TYLENOL) 500 MG tablet   Oral   Take 1,000 mg by mouth every 6 (six) hours as needed for mild pain.         Marland Kitchen. ibuprofen (ADVIL,MOTRIN) 200 MG tablet   Oral   Take 400 mg by mouth every 6 (six) hours as needed for mild pain.         . phenylephrine (SUDAFED PE) 10 MG TABS tablet   Oral   Take 20 mg by mouth every 4 (four) hours as needed (congestion).          BP 128/89  Pulse 119  Temp(Src) 98.4 F (36.9 C) (Oral)  Resp 18  Ht 5\' 7"  (1.702 m)  Wt 286 lb (129.729 kg)  BMI 44.78 kg/m2  SpO2 97%  LMP 11/16/2013 Physical Exam  Nursing note and vitals reviewed. Constitutional: She is oriented to person, place, and  time. She appears well-developed and well-nourished.  HENT:  Head: Normocephalic and atraumatic.  Right Ear: Tympanic membrane and external ear normal.  Left Ear: Tympanic membrane and external ear normal.  Mouth/Throat: Oropharynx is clear and moist. No oropharyngeal exudate.  Eyes: EOM are normal. Pupils are equal, round, and reactive to light.  R conjunctiva mildly injected.  EOMI.  Lids were everted, no fb.    No hyphema or chemosis noted  Neck: Normal range of motion. Neck supple.  No nuchal rigidity  Cardiovascular: Normal rate, regular rhythm and normal heart sounds.  Exam reveals no gallop and no friction rub.   No murmur heard. Pulmonary/Chest: Effort normal and breath sounds normal. No respiratory distress. She has no wheezes. She has no rales.  Abdominal: She  exhibits no distension.  Lymphadenopathy:    She has no cervical adenopathy.  Neurological: She is alert and oriented to person, place, and time.  Skin: Skin is warm and dry.  Psychiatric: She has a normal mood and affect.    ED Course  Procedures (including critical care time)  DIAGNOSTIC STUDIES: Oxygen Saturation is 97% on RA, adequate by my interpretation.    COORDINATION OF CARE: 12:28 PM Pt with headache ongoing for 4-5 days.  She does not appears toxic.  No redflags.  Did give pt toradol 30mg  IM with minimal improvement.  Normal visual acuity.  No vision changes.  Discussed treatment plan with pt at bedside and pt agreed to plan.  2:05 PM I suspect caffeine withdrawal headache, but can also be clustered headache.  Doubt SAH, Stroke, Meningitis.  plan to have pt f/u with PCP or neurologist of headache persists.  Recommend strict return if sxs worsen. Will d/c with a short course of pain medication, discussed risk of rebound headache.     Labs Review Labs Reviewed - No data to display Imaging Review No results found.  EKG Interpretation   None       MDM   1. Headache around the eyes    BP 128/89  Pulse 119  Temp(Src) 98.4 F (36.9 C) (Oral)  Resp 18  Ht 5\' 7"  (1.702 m)  Wt 286 lb (129.729 kg)  BMI 44.78 kg/m2  SpO2 97%  LMP 11/16/2013   I personally performed the services described in this documentation, which was scribed in my presence. The recorded information has been reviewed and is accurate.    Fayrene Helper, PA-C 12/01/13 [phone]

## 2013-12-14 ENCOUNTER — Ambulatory Visit: Payer: PRIVATE HEALTH INSURANCE | Admitting: Family Medicine

## 2014-01-04 ENCOUNTER — Ambulatory Visit (INDEPENDENT_AMBULATORY_CARE_PROVIDER_SITE_OTHER): Payer: 59 | Admitting: Family Medicine

## 2014-01-04 ENCOUNTER — Encounter: Payer: Self-pay | Admitting: Family Medicine

## 2014-01-04 VITALS — BP 108/78 | Temp 99.6°F | Ht 66.0 in | Wt 283.0 lb

## 2014-01-04 DIAGNOSIS — R519 Headache, unspecified: Secondary | ICD-10-CM

## 2014-01-04 DIAGNOSIS — R51 Headache: Secondary | ICD-10-CM

## 2014-01-04 DIAGNOSIS — F341 Dysthymic disorder: Secondary | ICD-10-CM

## 2014-01-04 DIAGNOSIS — G8929 Other chronic pain: Secondary | ICD-10-CM | POA: Insufficient documentation

## 2014-01-04 DIAGNOSIS — F329 Major depressive disorder, single episode, unspecified: Secondary | ICD-10-CM

## 2014-01-04 DIAGNOSIS — F419 Anxiety disorder, unspecified: Principal | ICD-10-CM

## 2014-01-04 DIAGNOSIS — Z7189 Other specified counseling: Secondary | ICD-10-CM

## 2014-01-04 DIAGNOSIS — E669 Obesity, unspecified: Secondary | ICD-10-CM

## 2014-01-04 DIAGNOSIS — Z7689 Persons encountering health services in other specified circumstances: Secondary | ICD-10-CM

## 2014-01-04 LAB — BASIC METABOLIC PANEL
BUN: 10 mg/dL (ref 6–23)
CHLORIDE: 106 meq/L (ref 96–112)
CO2: 23 meq/L (ref 19–32)
CREATININE: 0.8 mg/dL (ref 0.4–1.2)
Calcium: 9.3 mg/dL (ref 8.4–10.5)
GFR: 82.6 mL/min (ref >60.00)
Glucose, Bld: 74 mg/dL (ref 70–99)
POTASSIUM: 4.2 meq/L (ref 3.5–5.1)
Sodium: 139 mEq/L (ref 135–145)

## 2014-01-04 LAB — LIPID PANEL
CHOL/HDL RATIO: 6
CHOLESTEROL: 202 mg/dL — AB (ref 0–200)
HDL: 35.3 mg/dL — ABNORMAL LOW (ref >39.00)
Triglycerides: 89 mg/dL (ref 0.0–149.0)
VLDL: 17.8 mg/dL (ref 0.0–40.0)

## 2014-01-04 LAB — HEMOGLOBIN A1C: HEMOGLOBIN A1C: 5.8 % (ref 4.6–6.5)

## 2014-01-04 LAB — LDL CHOLESTEROL, DIRECT: LDL DIRECT: 160.6 mg/dL

## 2014-01-04 MED ORDER — ESCITALOPRAM OXALATE 10 MG PO TABS: 10.0000 mg | ORAL_TABLET | Freq: Every day | ORAL | Status: DC

## 2014-01-04 NOTE — Progress Notes (Signed)
Chief Complaint  Patient presents with  . Establish Care    HPI:  Lindsay Aguilar is here to establish care.  Last PCP and physical: saw her ob/gyn Nigel Bridgeman(Vicki Latham) [facility] - had physical, iron, vit d and thyroid - mirena started, taking iron and vit d, has follow up  Has the following chronic problems and concerns today:  Patient Active Problem List   Diagnosis Date Noted  . Chronic headaches 01/04/2014  . Anxiety and depression 01/04/2014  . Mild intermittent asthma   . 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  Past Medical History  Diagnosis Date  . Asthma   . 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 Hypertension   . Anemia     only with pregnancy  . Chicken pox   . Frequent headaches   . GERD (gastroesophageal reflux disease)   . Seasonal allergies   . Hypertension   . UTI (urinary tract infection)     Family History  Problem Relation Age of Onset  . Anesthesia problems Other   . Depression Father     History   Social History  . Marital Status: Married    Spouse Name: N/A    Number of Children: N/A  . Years of Education: N/A   Social History Main Topics  . Smoking status: Former Smoker    Types: Cigarettes    Quit date: 11/16/2006  . Smokeless tobacco: Never Used  . Alcohol Use: No  . Drug Use: No  .  Sexual Activity: Yes    Birth Control/ Protection: None   Other Topics Concern  . 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      Home Situation: lives with husband and 3 children      Spiritual Beliefs: Christian      Lifestyle: no regular exercise; working on diet - cut back on soda and portion sizes             Current outpatient prescriptions:acetaminophen (TYLENOL) 500 MG tablet, Take 1,000 mg by mouth every 6 (six) hours as needed for mild pain., Disp: , Rfl: ;  escitalopram (LEXAPRO) 10 MG tablet, Take 1 tablet (10 mg total) by mouth daily., Disp: 30 tablet, Rfl: 3;  ibuprofen (ADVIL,MOTRIN) 200 MG tablet, Take 400 mg by mouth every 6 (six) hours as needed for mild pain., Disp: , Rfl:   EXAM:  Filed Vitals:   01/04/14 0936  BP: 108/78  Temp: 99.6 F (37.6 C)    Body mass index is 45.7 kg/(m^2).  GENERAL: vitals reviewed and listed above, alert, oriented, appears well hydrated and in no acute distress  HEENT: atraumatic, conjunttiva clear, no obvious abnormalities on inspection of external nose and ears  NECK: no obvious masses on inspection  LUNGS: clear to auscultation  bilaterally, no wheezes, rales or rhonchi, good air movement  CV: HRRR, no peripheral edema  MS: moves all extremities without noticeable abnormality  PSYCH: pleasant and cooperative, tearful at times  ASSESSMENT AND PLAN:  Discussed the following assessment and plan:  Anxiety and depression - Plan: escitalopram (LEXAPRO) 10 MG tablet -discussed tx options and advised counseling, exercise, and medical options -opted for SSRI after discussion risks/benefits -brochure for counseling provided -follow up 4-6 weeks  Chronic headaches -warned of rebound headaches, she reports headaches improving  Encounter to establish care - Plan: Lipid panel, Basic metabolic panel, Hemoglobin A1c  Obesity, unspecified  -We reviewed  the PMH, PSH, FH, SH, Meds and Allergies. -We provided refills for any medications we will prescribe as needed. -We addressed current concerns per orders and patient instructions. -We have asked for records for pertinent exams, studies, vaccines and notes from previous providers. -We have advised patient to follow up per instructions below.   -Patient advised to return or notify a doctor immediately if symptoms worsen or persist or new concerns arise.  Patient Instructions  -We have ordered labs or studies at this visit. It can take up to 1-2 weeks for results and processing. We will contact you with instructions IF your results are abnormal. Normal results will be released to your [facility]. If you have not heard from [place] or can not find your results in [facility] in 2 weeks please contact our office.  -PLEASE SIGN UP FOR MYCHART TODAY   We recommend the following healthy lifestyle measures: - eat a healthy diet consisting of lots of vegetables, fruits, beans, nuts, seeds, healthy meats such as white chicken and fish and whole grains.  - avoid fried foods, fast food, processed foods, sodas, red meet and other fattening foods.  - get a least 150 minutes of aerobic exercise per week.   Follow up in: 4-6 weeks      Lindsay Aguilar R.

## 2014-01-04 NOTE — Patient Instructions (Signed)
-  We have ordered labs or studies at this visit. It can take up to 1-2 weeks for results and processing. We will contact you with instructions IF your results are abnormal. Normal results will be [facility]. If you have not heard from us or can not find your results in [facility]MY[facility]ART in 2 weeks please contact our office.  -PLEASE SIGN UP FOR MYCHART TODAY   We recommend the following healthy lifestyle measures: - eat a healthy diet consisting of lots of vegetables, fruits, beans, nuts, seeds, healthy meats such as white chicken and fish and whole grains.  - avoid fried foods, fast food, processed foods, sodas, red meet and other fattening foods.  - get a least 150 minutes of aerobic exercise per week.   Follow up in: 4-6 weeks

## 2014-01-05 NOTE — Progress Notes (Signed)
Quick Note:    Released to mychart.

## 2014-01-23 ENCOUNTER — Telehealth: Payer: Self-pay | Admitting: Family Medicine

## 2014-01-23 DIAGNOSIS — F329 Major depressive disorder, single episode, unspecified: Secondary | ICD-10-CM

## 2014-01-23 DIAGNOSIS — F32A Depression, unspecified: Secondary | ICD-10-CM

## 2014-01-23 DIAGNOSIS — F419 Anxiety disorder, unspecified: Principal | ICD-10-CM

## 2014-01-23 MED ORDER — ESCITALOPRAM OXALATE 10 MG PO TABS: 10.0000 mg | ORAL_TABLET | Freq: Every day | ORAL | Status: DC

## 2014-01-23 NOTE — Telephone Encounter (Signed)
[facility] requesting re-fill of escitalopram (LEXAPRO) 10 MG tablet

## 2014-01-23 NOTE — Telephone Encounter (Signed)
Rx sent to pharmacy for 90 day supply.  

## 2014-02-15 ENCOUNTER — Ambulatory Visit (INDEPENDENT_AMBULATORY_CARE_PROVIDER_SITE_OTHER)
Admission: RE | Admit: 2014-02-15 | Discharge: 2014-02-15 | Disposition: A | Discharge status: Home / Self Care | Payer: 59 | Source: Ambulatory Visit | Attending: Family Medicine | Admitting: Family Medicine

## 2014-02-15 ENCOUNTER — Encounter: Payer: Self-pay | Admitting: Family Medicine

## 2014-02-15 ENCOUNTER — Ambulatory Visit (INDEPENDENT_AMBULATORY_CARE_PROVIDER_SITE_OTHER): Payer: 59 | Admitting: Family Medicine

## 2014-02-15 VITALS — BP 100/64 | Temp 98.3°F | Wt 287.0 lb

## 2014-02-15 DIAGNOSIS — J45909 Unspecified asthma, uncomplicated: Secondary | ICD-10-CM

## 2014-02-15 DIAGNOSIS — F341 Dysthymic disorder: Secondary | ICD-10-CM

## 2014-02-15 DIAGNOSIS — R0989 Other specified symptoms and signs involving the circulatory and respiratory systems: Secondary | ICD-10-CM

## 2014-02-15 DIAGNOSIS — E785 Hyperlipidemia, unspecified: Secondary | ICD-10-CM | POA: Insufficient documentation

## 2014-02-15 DIAGNOSIS — R7309 Other abnormal glucose: Secondary | ICD-10-CM

## 2014-02-15 DIAGNOSIS — R0600 Dyspnea, unspecified: Secondary | ICD-10-CM

## 2014-02-15 DIAGNOSIS — R0609 Other forms of dyspnea: Secondary | ICD-10-CM

## 2014-02-15 DIAGNOSIS — R7303 Prediabetes: Secondary | ICD-10-CM | POA: Insufficient documentation

## 2014-02-15 DIAGNOSIS — F329 Major depressive disorder, single episode, unspecified: Secondary | ICD-10-CM

## 2014-02-15 DIAGNOSIS — F419 Anxiety disorder, unspecified: Secondary | ICD-10-CM

## 2014-02-15 MED ORDER — ESCITALOPRAM OXALATE 20 MG PO TABS: 20.0000 mg | ORAL_TABLET | Freq: Every day | ORAL | Status: DC

## 2014-02-15 MED ORDER — ALBUTEROL SULFATE HFA 108 (90 BASE) MCG/ACT IN AERS: 2.0000 | INHALATION_SPRAY | Freq: Four times a day (QID) | RESPIRATORY_TRACT | Status: DC | PRN

## 2014-02-15 NOTE — Patient Instructions (Signed)
We recommend the following healthy lifestyle measures: - eat a healthy diet consisting of lots of vegetables, fruits, beans, nuts, seeds, healthy meats such as white chicken and fish and whole grains.  - avoid fried foods, fast food, processed foods, sodas, red meet and other fattening foods.  - get a least 150 - 300 minutes of aerobic exercise per week.   Increase lexapro to 20mg  daily  Follow up with cardiologist  Get chest xray  Try albuterol  Follow up in: 1 month or sooner if any concerns

## 2014-02-15 NOTE — Progress Notes (Signed)
Pre visit review using our clinic review tool, if applicable. No additional management support is needed unless otherwise documented below in the visit note. 

## 2014-02-15 NOTE — Progress Notes (Addendum)
Chief Complaint  Patient presents with  . 5 week follow up    HPI:  Follow up:  Depression: -started lexapro last visit and advised counseling and exercise  -she reports: not exercise, no counseling, is improving but thinks may need to increase lexapro - spouse has seen sig improvement -denies: no thoughts of self harm, no side effects, tol medication well  HLD: -diet and exercise advised -not doing much now  Prediabetes: -diet and exercise advised  Occ Dyspnea: -with rest or activity, mild - more of a yawn -can occur any time, started on day of aunt's death -denies: CP, swelling, wheezing, coughing, palpitations -hc cardiomyopathy with pregnancy followed by cardiologist, hx of mild asthma  ROS: See pertinent positives and negatives per HPI.  Past Medical History  Diagnosis Date  . Asthma   . 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 Hypertension   . Anemia     only with pregnancy  . Chicken pox   . Frequent headaches   . GERD (gastroesophageal reflux disease)   . Seasonal allergies   . Hypertension   . UTI (urinary tract infection)     Past Surgical History  Procedure Laterality Date  . Cholecystectomy  04/2008  . Wisdom tooth extraction  1997    Family History  Problem Relation Age of Onset  . Anesthesia problems Other   . Depression Father     History   Social History  . Marital Status: Married    Spouse Name: N/A    Number of Children: N/A  . Years of Education: N/A   Social History Main Topics  . Smoking status: Former Smoker    Types: Cigarettes    Quit date: 11/16/2006  . Smokeless tobacco: Never Used  . Alcohol Use: No  . Drug Use: No  . Sexual Activity: Yes    Birth Control/ Protection: None   Other Topics Concern  . 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      Home  Situation: lives with husband and 3 children      Spiritual Beliefs: Christian      Lifestyle: no regular exercise; working on diet - cut back on soda and portion sizes             Current outpatient prescriptions:acetaminophen (TYLENOL) 500 MG tablet, Take 1,000 mg by mouth every 6 (six) hours as needed for mild pain., Disp: , Rfl: ;  albuterol (PROVENTIL HFA;VENTOLIN HFA) 108 (90 BASE) MCG/ACT inhaler, Inhale 2 puffs into the lungs every 6 (six) hours as needed for wheezing or shortness of breath., Disp: 1 Inhaler, Rfl: 1 escitalopram (LEXAPRO) 20 MG tablet, Take 1 tablet (20 mg total) by mouth daily., Disp: 90 tablet, Rfl: 1;  ibuprofen (ADVIL,MOTRIN) 200 MG tablet, Take 400 mg by mouth every 6 (six) hours as needed for mild pain., Disp: , Rfl:   EXAM:  Filed Vitals:   02/15/14 1046  BP: 100/64  Temp: 98.3 F (36.8 C)   HR 84  Body mass index is 46.35 kg/(m^2).  GENERAL: vitals reviewed and listed above, alert, oriented, appears well hydrated and in no acute distress  HEENT: atraumatic, conjunttiva clear, no obvious abnormalities on inspection of external nose and ears  NECK: no obvious masses on inspection  LUNGS: clear to auscultation bilaterally, no wheezes, rales or rhonchi, good air movement  CV: HRRR,  no peripheral edema  MS: moves all extremities without noticeable abnormality  PSYCH: pleasant and cooperative, no obvious depression or anxiety  ASSESSMENT AND PLAN:  Discussed the following assessment and plan:  Prediabetes  Dyslipidemia  Anxiety and depression - Plan: escitalopram (LEXAPRO) 20 MG tablet  Asthma - Plan: DG Chest 2 View, albuterol (PROVENTIL HFA;VENTOLIN HFA) 108 (90 BASE) MCG/ACT inhaler  Dyspnea  -discussed options for depression and she opted to increase lexapro -she is going to follow up with her cardiologist about her mild occ dyspnea given hx, also will get CXR and trial of albuterol, query anxiety most likely and advise counseling  nad increasing lexapro, wells 0  -dicussed dyslipidemia and options including statin - she prefers trial of diet and exercise after eval with cards -diet and exercise for pre-diabetes as well -follow up 1 month -Patient advised to return or notify a doctor immediately if symptoms worsen or persist or new concerns arise.  Patient Instructions  We recommend the following healthy lifestyle measures: - eat a healthy diet consisting of lots of vegetables, fruits, beans, nuts, seeds, healthy meats such as white chicken and fish and whole grains.  - avoid fried foods, fast food, processed foods, sodas, red meet and other fattening foods.  - get a least 150 - 300 minutes of aerobic exercise per week.   Increase lexapro to 20mg  daily  Follow up with cardiologist  Get chest xray  Try albuterol  Follow up in: 1 month or sooner if any concerns      KIM, HANNAH R.

## 2014-03-01 ENCOUNTER — Ambulatory Visit (INDEPENDENT_AMBULATORY_CARE_PROVIDER_SITE_OTHER): Payer: 59 | Admitting: Nurse Practitioner

## 2014-03-01 ENCOUNTER — Encounter: Payer: Self-pay | Admitting: Nurse Practitioner

## 2014-03-01 VITALS — BP 110/80 | HR 64 | Ht 67.0 in | Wt 281.8 lb

## 2014-03-01 DIAGNOSIS — R0989 Other specified symptoms and signs involving the circulatory and respiratory systems: Secondary | ICD-10-CM

## 2014-03-01 DIAGNOSIS — F329 Major depressive disorder, single episode, unspecified: Secondary | ICD-10-CM

## 2014-03-01 DIAGNOSIS — R0609 Other forms of dyspnea: Secondary | ICD-10-CM

## 2014-03-01 DIAGNOSIS — F419 Anxiety disorder, unspecified: Secondary | ICD-10-CM

## 2014-03-01 DIAGNOSIS — F341 Dysthymic disorder: Secondary | ICD-10-CM

## 2014-03-01 DIAGNOSIS — F32A Depression, unspecified: Secondary | ICD-10-CM

## 2014-03-01 DIAGNOSIS — R0600 Dyspnea, unspecified: Secondary | ICD-10-CM

## 2014-03-01 NOTE — Progress Notes (Signed)
Lindsay Aguilar Date of Birth: 05/07/1981 Medical Record #[id number]  History of Present Illness: [name] is seen back today for a follow up visit. Seen for Dr. Elease HashimotoNahser. She is a 33 year old female with obesity, past smoker, HTN, anemia, asthma and prior CHF with her first pregnancy. 2nd pregnancy was normal. Sent back here in May with pregnancy #3 due to concern. She had no indication of CHF. No recent testing noted in her EPIC chart. Normal EF by an echo from 2006 noted.   Has not been seen here since May of 2012.   Comes in today. Here with her children. She notes more fatigue. More shortness of breath. Not swelling. No cough. "Never feels" refreshed. 3 young children. Working full time at [employer]. Weight has actually dropped a few pounds. PCP sent her for a CXR - heart size read as normal. No pulmonary edema.    Current Outpatient Prescriptions  Medication Sig Dispense Refill  . acetaminophen (TYLENOL) 500 MG tablet Take 1,000 mg by mouth every 6 (six) hours as needed for mild pain.      Marland Kitchen. escitalopram (LEXAPRO) 20 MG tablet Take 1 tablet (20 mg total) by mouth daily.  90 tablet  1  . ferrous sulfate 325 (65 FE) MG tablet Take 325 mg by mouth daily with breakfast.      . ibuprofen (ADVIL,MOTRIN) 200 MG tablet Take 400 mg by mouth every 6 (six) hours as needed for mild pain.      . Multiple Vitamin (MULTIVITAMIN) capsule Take 1 capsule by mouth daily.       No current facility-administered medications for this visit.    Allergies  Allergen Reactions  . Amoxicillin Other (See Comments)    Yeast infection    Past Medical History  Diagnosis Date  . Asthma   . 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 Hypertension   . Anemia     only with pregnancy  . Chicken pox   . Frequent headaches   . GERD (gastroesophageal reflux disease)   . Seasonal allergies   . Hypertension   . UTI (urinary tract  infection)     Past Surgical History  Procedure Laterality Date  . Cholecystectomy  04/2008  . Wisdom tooth extraction  1997    History  Smoking status  . Former Smoker  . Types: Cigarettes  . Quit date: 11/16/2006  Smokeless tobacco  . Never Used    History  Alcohol Use No    Family History  Problem Relation Age of Onset  . Anesthesia problems Other   . Depression Father     Review of Systems: The review of systems is per the HPI.  All other systems were reviewed and are negative.  Physical Exam: BP 110/80  Pulse 64  Ht 5\' 7"  (1.702 m)  Wt 281 lb 12.8 oz (127.824 kg)  BMI 44.13 kg/m2  SpO2 97% Patient is very pleasant and in no acute distress. She is obese. Skin is warm and dry. Color is normal.  HEENT is unremarkable. Normocephalic/atraumatic. PERRL. Sclera are nonicteric. Neck is supple. No masses. No JVD. Lungs are clear. Cardiac exam shows a regular rate and rhythm. Abdomen is soft. Extremities are without edema. Gait and ROM are intact. No gross neurologic deficits noted.  Wt Readings from Last 3 Encounters:  03/01/14 281 lb 12.8 oz (127.824 kg)  02/15/14 287 lb (130.182 kg)  01/04/14 283  lb (128.368 kg)     LABORATORY DATA: EKG today shows sinus rhythm.   Lab Results  Component Value Date   WBC 12.6* 10/29/2012   HGB 12.9 10/29/2012   HCT 38.0 10/29/2012   PLT 335 10/29/2012   GLUCOSE 74 01/04/2014   CHOL 202* 01/04/2014   TRIG 89.0 01/04/2014   HDL 35.30* 01/04/2014   LDLDIRECT 160.6 01/04/2014   ALT 9 05/30/2011   AST 13 05/30/2011   NA 139 01/04/2014   K 4.2 01/04/2014   CL 106 01/04/2014   CREATININE 0.8 01/04/2014   BUN 10 01/04/2014   CO2 23 01/04/2014   HGBA1C 5.8 01/04/2014     Assessment / Plan: 1. Past peripartum CM - no recent testing. Now with dyspnea and fatigue - will check BNP and echo. I suspect most of this is weight related and probable OSA - encouraged her to think about a sleep study  2. Obesity  Patient is agreeable to this  plan and will call if any problems develop in the interim.   Rosalio MacadamiaLori C. Marlies Ligman, RN, ANP-C [facility] [address] [place], [place]  [id number] [phone]

## 2014-03-01 NOTE — Patient Instructions (Addendum)
Will obtain labs today and call you with the results (bnp)  Your physician has requested that you have an echocardiogram. Echocardiography is a painless test that uses sound waves to create images of your heart. It provides your doctor with information about the size and shape of your heart and how well your heart's chambers and valves are working. This procedure takes approximately one hour. There are no restrictions for this procedure.  Stay on your current medicines  See Dr. Elease HashimotoNahser back as needed  Call the [facility] office at [phone] if you have any questions, problems or concerns.

## 2014-03-02 LAB — BASIC METABOLIC PANEL
BUN: 12 mg/dL (ref 6–23)
CO2: 27 mEq/L (ref 19–32)
Calcium: 9.4 mg/dL (ref 8.4–10.5)
Chloride: 108 mEq/L (ref 96–112)
Creatinine, Ser: 0.9 mg/dL (ref 0.4–1.2)
GFR: 79.24 mL/min (ref >60.00)
Glucose, Bld: 86 mg/dL (ref 70–99)
Potassium: 4.5 mEq/L (ref 3.5–5.1)
Sodium: 140 mEq/L (ref 135–145)

## 2014-03-02 LAB — BRAIN NATRIURETIC PEPTIDE: Pro B Natriuretic peptide (BNP): 35 pg/mL (ref 0.0–100.0)

## 2014-03-09 ENCOUNTER — Encounter: Payer: Self-pay | Admitting: Cardiology

## 2014-03-09 ENCOUNTER — Ambulatory Visit (HOSPITAL_COMMUNITY): Payer: 59 | Attending: Cardiology | Admitting: Radiology

## 2014-03-09 DIAGNOSIS — R0602 Shortness of breath: Secondary | ICD-10-CM

## 2014-03-09 DIAGNOSIS — R0989 Other specified symptoms and signs involving the circulatory and respiratory systems: Secondary | ICD-10-CM | POA: Insufficient documentation

## 2014-03-09 DIAGNOSIS — R0609 Other forms of dyspnea: Secondary | ICD-10-CM | POA: Insufficient documentation

## 2014-03-09 DIAGNOSIS — F341 Dysthymic disorder: Secondary | ICD-10-CM | POA: Insufficient documentation

## 2014-03-09 DIAGNOSIS — I509 Heart failure, unspecified: Secondary | ICD-10-CM

## 2014-03-09 DIAGNOSIS — F329 Major depressive disorder, single episode, unspecified: Secondary | ICD-10-CM

## 2014-03-09 DIAGNOSIS — F419 Anxiety disorder, unspecified: Secondary | ICD-10-CM

## 2014-03-09 DIAGNOSIS — R0600 Dyspnea, unspecified: Secondary | ICD-10-CM

## 2014-03-09 HISTORY — PX: TRANSTHORACIC ECHOCARDIOGRAM: SHX275

## 2014-03-09 NOTE — Progress Notes (Signed)
Echocardiogram performed.  

## 2014-03-22 ENCOUNTER — Ambulatory Visit: Payer: 59 | Admitting: Family Medicine

## 2014-05-17 ENCOUNTER — Ambulatory Visit (INDEPENDENT_AMBULATORY_CARE_PROVIDER_SITE_OTHER): Payer: 59 | Admitting: Family Medicine

## 2014-05-17 ENCOUNTER — Encounter: Payer: Self-pay | Admitting: Family Medicine

## 2014-05-17 VITALS — BP 120/80 | HR 69 | Temp 98.7°F | Ht 67.0 in | Wt 272.5 lb

## 2014-05-17 DIAGNOSIS — E669 Obesity, unspecified: Secondary | ICD-10-CM

## 2014-05-17 DIAGNOSIS — R7303 Prediabetes: Secondary | ICD-10-CM

## 2014-05-17 DIAGNOSIS — R7309 Other abnormal glucose: Secondary | ICD-10-CM

## 2014-05-17 DIAGNOSIS — H6982 Other specified disorders of Eustachian tube, left ear: Secondary | ICD-10-CM

## 2014-05-17 DIAGNOSIS — F329 Major depressive disorder, single episode, unspecified: Secondary | ICD-10-CM

## 2014-05-17 DIAGNOSIS — E785 Hyperlipidemia, unspecified: Secondary | ICD-10-CM

## 2014-05-17 DIAGNOSIS — F341 Dysthymic disorder: Secondary | ICD-10-CM

## 2014-05-17 DIAGNOSIS — H6980 Other specified disorders of Eustachian tube, unspecified ear: Secondary | ICD-10-CM

## 2014-05-17 DIAGNOSIS — F419 Anxiety disorder, unspecified: Secondary | ICD-10-CM

## 2014-05-17 DIAGNOSIS — H6992 Unspecified Eustachian tube disorder, left ear: Secondary | ICD-10-CM

## 2014-05-17 MED ORDER — ESCITALOPRAM OXALATE 20 MG PO TABS: 20.0000 mg | ORAL_TABLET | Freq: Every day | ORAL | Status: DC

## 2014-05-17 NOTE — Progress Notes (Signed)
No chief complaint on file.   HPI:  Follow up:  Depression: -increased dose of lexapro at last visit to 20mg  -exercise/counseling: walking -reports: doing better -denies: regular crying spells, no hopelessness, or thoughts of self harm  Mild DOE: -cxr neg, reports albuterol not used, has been doing better and has not had any breathless spells recently, usually only occurs if stressed out -saw cardiologist, notes reviewed, echo normal, felt to be weight related -denies: CP, SOB, palpitations, swelling  HLD: -working on exercise and diet  Vit D Def: -was low and was on vit d, but not taking vit d -reports gyn check thyroid and was normal   Left ear pressure: -for a week -a little pain -swims frequently  ROS: See pertinent positives and negatives per HPI.  Past Medical History  Diagnosis Date  . Asthma   . 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 Hypertension   . Anemia     only with pregnancy  . Chicken pox   . Frequent headaches   . GERD (gastroesophageal reflux disease)   . Seasonal allergies   . Hypertension   . UTI (urinary tract infection)     Past Surgical History  Procedure Laterality Date  . Cholecystectomy  04/2008  . Wisdom tooth extraction  1997    Family History  Problem Relation Age of Onset  . Anesthesia problems Other   . Depression Father     History   Social History  . Marital Status: Married    Spouse Name: N/A    Number of Children: N/A  . Years of Education: N/A   Social History Main Topics  . Smoking status: Former Smoker    Types: Cigarettes    Quit date: 11/16/2006  . Smokeless tobacco: Never Used  . Alcohol Use: No  . Drug Use: No  . Sexual Activity: Yes    Birth Control/ Protection: None   Other Topics Concern  . 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       Home Situation: lives with husband and 3 children      Spiritual Beliefs: Christian      Lifestyle: no regular exercise; working on diet - cut back on soda and portion sizes             Current outpatient prescriptions:escitalopram (LEXAPRO) 20 MG tablet, Take 1 tablet (20 mg total) by mouth daily., Disp: 90 tablet, Rfl: 3;  ferrous sulfate 325 (65 FE) MG tablet, Take 325 mg by mouth daily with breakfast., Disp: , Rfl: ;  Multiple Vitamin (MULTIVITAMIN) capsule, Take 1 capsule by mouth daily., Disp: , Rfl:   EXAM:  Filed Vitals:   05/17/14 1506  BP: 120/80  Pulse: 69  Temp: 98.7 F (37.1 C)    Body mass index is 42.67 kg/(m^2).  GENERAL: vitals reviewed and listed above, alert, oriented, appears well hydrated and in no acute distress  HEENT: atraumatic, conjunttiva clear, no obvious abnormalities on inspection of external nose and ears, normal appearance of ear canals and TMs, clear nasal congestion, mild post oropharyngeal erythema with PND, no tonsillar edema or exudate, no sinus TTP   NECK: no obvious masses on inspection  LUNGS: clear to auscultation bilaterally, no wheezes, rales or rhonchi, good air movement  CV: HRRR, no peripheral edema  MS: moves all extremities without noticeable abnormality  PSYCH: pleasant and  cooperative, no obvious depression or anxiety  ASSESSMENT AND PLAN:  Discussed the following assessment and plan:  Prediabetes -advised diet and exercise  Dyslipidemia -advised diet and exercise and advised need to recheck and if not improving consider medication  Anxiety and depression -improved, continue lexapro  Obesity, unspecified -diet and exercise  Eustachian tube dysfunction, left -nasocort  -follow up in 3 months for CPE -Patient advised to return or notify a doctor immediately if symptoms worsen or persist or new concerns arise.  Patient Instructions  -[employer] brand Vit D3 2000 IU daily  -1200mg  calcium daily (but half of this  probably comes from food)  -We recommend the following healthy lifestyle measures: - eat a healthy diet consisting of lots of vegetables, fruits, beans, nuts, seeds, healthy meats such as white chicken and fish and whole grains.  - avoid fried foods, fast food, processed foods, sodas, red meet and other fattening foods.  - get a least 150 minutes of aerobic exercise per week.   Nasocort daily for 2-4 weeks for the ear, follow up if persists or worsens  -follow up for physical and labs in about 3 months     Jeymi Hepp R.

## 2014-05-17 NOTE — Progress Notes (Signed)
Pre visit review using our clinic review tool, if applicable. No additional management support is needed unless otherwise documented below in the visit note. 

## 2014-05-17 NOTE — Patient Instructions (Signed)
-  [employer] brand Vit D3 2000 IU daily  -1200mg  calcium daily (but half of this probably comes from food)  -We recommend the following healthy lifestyle measures: - eat a healthy diet consisting of lots of vegetables, fruits, beans, nuts, seeds, healthy meats such as white chicken and fish and whole grains.  - avoid fried foods, fast food, processed foods, sodas, red meet and other fattening foods.  - get a least 150 minutes of aerobic exercise per week.   Nasocort daily for 2-4 weeks for the ear, follow up if persists or worsens  -follow up for physical and labs in about 3 months

## 2014-06-23 ENCOUNTER — Other Ambulatory Visit: Payer: Self-pay | Admitting: Family Medicine

## 2014-06-26 NOTE — Telephone Encounter (Signed)
Call in #[phone] only 

## 2014-07-23 ENCOUNTER — Other Ambulatory Visit: Payer: Self-pay | Admitting: Family Medicine

## 2014-07-27 ENCOUNTER — Encounter: Payer: Self-pay | Admitting: Family Medicine

## 2014-08-30 ENCOUNTER — Encounter: Payer: 59 | Admitting: Family Medicine

## 2014-09-14 ENCOUNTER — Encounter: Payer: Self-pay | Admitting: Family Medicine

## 2014-09-14 ENCOUNTER — Ambulatory Visit (INDEPENDENT_AMBULATORY_CARE_PROVIDER_SITE_OTHER): Payer: 59 | Admitting: Family Medicine

## 2014-09-14 VITALS — BP 116/78 | HR 70 | Temp 97.5°F | Ht 65.5 in | Wt 280.2 lb

## 2014-09-14 DIAGNOSIS — F329 Major depressive disorder, single episode, unspecified: Secondary | ICD-10-CM

## 2014-09-14 DIAGNOSIS — R7303 Prediabetes: Secondary | ICD-10-CM

## 2014-09-14 DIAGNOSIS — Z0000 Encounter for general adult medical examination without abnormal findings: Secondary | ICD-10-CM

## 2014-09-14 DIAGNOSIS — J4520 Mild intermittent asthma, uncomplicated: Secondary | ICD-10-CM

## 2014-09-14 DIAGNOSIS — R7309 Other abnormal glucose: Secondary | ICD-10-CM

## 2014-09-14 DIAGNOSIS — E785 Hyperlipidemia, unspecified: Secondary | ICD-10-CM

## 2014-09-14 DIAGNOSIS — F419 Anxiety disorder, unspecified: Secondary | ICD-10-CM

## 2014-09-14 DIAGNOSIS — F32A Depression, unspecified: Secondary | ICD-10-CM

## 2014-09-14 DIAGNOSIS — F418 Other specified anxiety disorders: Secondary | ICD-10-CM

## 2014-09-14 DIAGNOSIS — Z23 Encounter for immunization: Secondary | ICD-10-CM

## 2014-09-14 LAB — LIPID PANEL
CHOLESTEROL: 208 mg/dL — AB (ref 0–200)
HDL: 34.5 mg/dL — ABNORMAL LOW (ref >39.00)
LDL Cholesterol: 156 mg/dL — ABNORMAL HIGH (ref 0–99)
NonHDL: 173.5
Total CHOL/HDL Ratio: 6
Triglycerides: 87 mg/dL (ref 0.0–149.0)
VLDL: 17.4 mg/dL (ref 0.0–40.0)

## 2014-09-14 LAB — BASIC METABOLIC PANEL
BUN: 12 mg/dL (ref 6–23)
CHLORIDE: 105 meq/L (ref 96–112)
CO2: 27 meq/L (ref 19–32)
CREATININE: 0.9 mg/dL (ref 0.4–1.2)
Calcium: 9 mg/dL (ref 8.4–10.5)
GFR: 75 mL/min (ref >60.00)
Glucose, Bld: 83 mg/dL (ref 70–99)
POTASSIUM: 4.4 meq/L (ref 3.5–5.1)
Sodium: 138 mEq/L (ref 135–145)

## 2014-09-14 LAB — HEMOGLOBIN A1C: HEMOGLOBIN A1C: 5.7 % (ref 4.6–6.5)

## 2014-09-14 MED ORDER — ESCITALOPRAM OXALATE 20 MG PO TABS: ORAL_TABLET | ORAL | Status: DC

## 2014-09-14 NOTE — Progress Notes (Signed)
Preventive/cpe and follow up  HPI:  Here for CPE:  -Concerns and/or follow up today:   Depression:  -meds: lexapro 20mg  - started 12/2013 -exercise/counseling - not walking much -reports: doing well, needs refill -denies: regular crying spells, no hopelessness, or thoughts of self harm   Mild DOE:  -cxr neg, reports albuterol not used, has been doing better and has not had any breathless spells recently, usually only occurs if stressed out  -saw cardiologist 02/2014, notes reviewed, echo normal, felt to be weight related  -denies: CP, SOB, palpitations, swelling   HLD/Prediabetes:  -working on diet - getting a treadmill which may help exercise part  Vit D Def:  -was low and was on vit D -reports gyn check thyroid and was normal   -Diet: variety of foods, balance and well rounded  -Exercise: no regular exercise  -Taking folic acid, vitamin D or calcium: no  -Diabetes and Dyslipidemia Screening: FASTING  -Hx of HTN: no  -Vaccines: UTD  -pap history: done in February with gyn and normal  -sexual activity: yes, female partner, no new partners  -wants STI testing: no  -FH breast, colon or ovarian ca: see FH Last mammogram: n/a Last colon cancer screening:n/a  Breast Ca Risk Assessment: -no breast or ovarian ca in personal or FH  -Alcohol, Tobacco, drug use: see social history  Review of Systems - no fevers, unintentional weight loss, vision loss, hearing loss, chest pain, sob, hemoptysis, melena, hematochezia, hematuria, genital discharge, changing or concerning skin lesions, bleeding, bruising, loc, thoughts of self harm or SI  Past Medical History  Diagnosis Date  . Asthma   . 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 Hypertension   . Anemia     only with pregnancy  . Chicken pox   . Frequent headaches   . GERD (gastroesophageal reflux disease)   . Seasonal allergies   .  Hypertension   . UTI (urinary tract infection)     Past Surgical History  Procedure Laterality Date  . Cholecystectomy  04/2008  . Wisdom tooth extraction  1997    Family History  Problem Relation Age of Onset  . Anesthesia problems Other   . Depression Father     History   Social History  . Marital Status: Married    Spouse Name: N/A    Number of Children: N/A  . Years of Education: N/A   Social History Main Topics  . Smoking status: Former Smoker    Types: Cigarettes    Quit date: 11/16/2006  . Smokeless tobacco: Never Used  . Alcohol Use: No  . Drug Use: No  . Sexual Activity: Yes    Birth Control/ Protection: None   Other Topics Concern  . 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      Home Situation: lives with husband and 3 children      Spiritual Beliefs: Christian      Lifestyle: no regular exercise; working on diet - cut back on soda and portion sizes             Current outpatient prescriptions:escitalopram (LEXAPRO) 20 MG tablet, Take 1 tablet by mouth  daily, Disp: 90 tablet, Rfl: 3;  ferrous sulfate 325 (65 FE) MG tablet, Take 325 mg by mouth daily with breakfast., Disp: , Rfl:   EXAM:  Filed Vitals:   09/14/14 [id number]  BP: 116/78  Pulse: 70  Temp: 97.5 F (36.4 C)    GENERAL: vitals reviewed and listed below, alert, oriented, appears well hydrated and in no acute distress  HEENT: head atraumatic, PERRLA, normal appearance of eyes, ears, nose and mouth. moist mucus membranes.  NECK: supple, no masses or lymphadenopathy  LUNGS: clear to auscultation bilaterally, no rales, rhonchi or wheeze  CV: HRRR, no peripheral edema or cyanosis, normal pedal pulses  BREAST: declined  ABDOMEN: bowel sounds normal, soft, non tender to palpation, no masses, no rebound or guarding  GU: declined  RECTAL: refused  SKIN: no rash or abnormal lesions  MS: normal gait, moves all  extremities normally  NEURO: CN II-XII grossly intact, normal muscle strength and sensation to light touch on extremities  PSYCH: normal affect, pleasant and cooperative  ASSESSMENT AND PLAN:  Discussed the following assessment and plan:   1. Visit for preventive health examination -Discussed and advised all [place] preventive services health task force level A and B recommendations for age, sex and risks. -Advised at least 150 minutes of exercise per week and a healthy diet low in saturated fats and sweets and consisting of fresh fruits and vegetables, lean meats such as fish and white chicken and whole grains. -labs, studies and vaccines per orders this encounter   2. Anxiety and depression -stable, continue zoloft  3. Prediabetes -hgba1c -lifestyle recs  4. Dyslipidemia -lifestyle recs  5. Mild intermittent asthma, uncomplicated -stable   Orders Placed This Encounter  Procedures  . Lipid Panel  . Hemoglobin A1c  . Basic metabolic panel     Patient advised to return to clinic immediately if symptoms worsen or persist or new concerns.  Patient Instructions  BEFORE YOU LEAVE: -labs -follow up in 4 months  Vitamin D3 [phone] IU daily  -We have ordered labs or studies at this visit. It can take up to 1-2 weeks for results and processing. We will contact you with instructions IF your results are abnormal. Normal results will be released to your [facility]. If you have not heard from us or can not find your results in [facility] in 2 weeks please contact our office.  We recommend the following healthy lifestyle measures: - eat a healthy diet consisting of lots of vegetables, fruits, beans, nuts, seeds, healthy meats such as white chicken and fish and whole grains.  - avoid fried foods, fast food, processed foods, sodas, red meet and other fattening foods.  - get a least 150 minutes of aerobic exercise per week.          Return in about 4 months (around 01/14/2015) for  follow up.  Kriste BasqueKIM, HANNAH R.

## 2014-09-14 NOTE — Patient Instructions (Signed)
BEFORE YOU LEAVE: -labs -follow up in 4 months  Vitamin D3 [phone] IU daily  -We have ordered labs or studies at this visit. It can take up to 1-2 weeks for results and processing. We will contact you with instructions IF your results are abnormal. Normal results will be released to your [facility]. If you have not heard from us or can not find your results in [facility] in 2 weeks please contact our office.  We recommend the following healthy lifestyle measures: - eat a healthy diet consisting of lots of vegetables, fruits, beans, nuts, seeds, healthy meats such as white chicken and fish and whole grains.  - avoid fried foods, fast food, processed foods, sodas, red meet and other fattening foods.  - get a least 150 minutes of aerobic exercise per week.

## 2014-09-14 NOTE — Addendum Note (Signed)
Addended by: Johnella MoloneyFUNDERBURK, JO A on: 09/14/2014 09:50 AM   Modules accepted: Orders

## 2014-09-14 NOTE — Progress Notes (Signed)
Pre visit review using our clinic review tool, if applicable. No additional management support is needed unless otherwise documented below in the visit note. 

## 2014-09-17 ENCOUNTER — Encounter: Payer: Self-pay | Admitting: Family Medicine

## 2014-09-17 MED ORDER — PRAVASTATIN SODIUM 20 MG PO TABS: 20.0000 mg | ORAL_TABLET | Freq: Every day | ORAL | Status: DC

## 2014-09-17 MED ORDER — PRAVASTATIN SODIUM 20 MG PO TABS
20.0000 mg | ORAL_TABLET | Freq: Every day | ORAL | Status: DC
Start: 2014-09-17 — End: 2018-06-23

## 2014-09-17 NOTE — Addendum Note (Signed)
Addended by: Johnella MoloneyFUNDERBURK, JO A on: 09/17/2014 11:29 AM   Modules accepted: Orders

## 2015-07-30 ENCOUNTER — Emergency Department (HOSPITAL_COMMUNITY): Payer: 59

## 2015-07-30 ENCOUNTER — Encounter (HOSPITAL_COMMUNITY): Payer: Self-pay

## 2015-07-30 ENCOUNTER — Emergency Department (HOSPITAL_COMMUNITY)
Admission: EM | Admit: 2015-07-30 | Discharge: 2015-07-30 | Disposition: A | Discharge status: Home / Self Care | Payer: 59 | Attending: Emergency Medicine | Admitting: Emergency Medicine

## 2015-07-30 DIAGNOSIS — J3489 Other specified disorders of nose and nasal sinuses: Secondary | ICD-10-CM | POA: Diagnosis not present

## 2015-07-30 DIAGNOSIS — Z79899 Other long term (current) drug therapy: Secondary | ICD-10-CM | POA: Insufficient documentation

## 2015-07-30 DIAGNOSIS — J45909 Unspecified asthma, uncomplicated: Secondary | ICD-10-CM | POA: Insufficient documentation

## 2015-07-30 DIAGNOSIS — Z8619 Personal history of other infectious and parasitic diseases: Secondary | ICD-10-CM | POA: Insufficient documentation

## 2015-07-30 DIAGNOSIS — I509 Heart failure, unspecified: Secondary | ICD-10-CM | POA: Diagnosis not present

## 2015-07-30 DIAGNOSIS — R569 Unspecified convulsions: Secondary | ICD-10-CM | POA: Insufficient documentation

## 2015-07-30 DIAGNOSIS — Z8719 Personal history of other diseases of the digestive system: Secondary | ICD-10-CM | POA: Diagnosis not present

## 2015-07-30 DIAGNOSIS — Z87891 Personal history of nicotine dependence: Secondary | ICD-10-CM | POA: Diagnosis not present

## 2015-07-30 DIAGNOSIS — Z87440 Personal history of urinary (tract) infections: Secondary | ICD-10-CM | POA: Insufficient documentation

## 2015-07-30 DIAGNOSIS — I10 Essential (primary) hypertension: Secondary | ICD-10-CM | POA: Diagnosis not present

## 2015-07-30 DIAGNOSIS — D649 Anemia, unspecified: Secondary | ICD-10-CM | POA: Insufficient documentation

## 2015-07-30 LAB — CBC WITH DIFFERENTIAL/PLATELET
BASOS PCT: 0 % (ref 0–1)
Basophils Absolute: 0 10*3/uL (ref 0.0–0.1)
EOS ABS: 0.1 10*3/uL (ref 0.0–0.7)
EOS PCT: 1 % (ref 0–5)
HCT: 40.7 % (ref 36.0–46.0)
HEMOGLOBIN: 13 g/dL (ref 12.0–15.0)
LYMPHS ABS: 1.8 10*3/uL (ref 0.7–4.0)
Lymphocytes Relative: 21 % (ref 12–46)
MCH: 24.8 pg — AB (ref 26.0–34.0)
MCHC: 31.9 g/dL (ref 30.0–36.0)
MCV: 77.7 fL — ABNORMAL LOW (ref 78.0–100.0)
MONOS PCT: 5 % (ref 3–12)
Monocytes Absolute: 0.4 10*3/uL (ref 0.1–1.0)
NEUTROS PCT: 73 % (ref 43–77)
Neutro Abs: 6.2 10*3/uL (ref 1.7–7.7)
PLATELETS: 278 10*3/uL (ref 150–400)
RBC: 5.24 MIL/uL — AB (ref 3.87–5.11)
RDW: 14 % (ref 11.5–15.5)
WBC: 8.5 10*3/uL (ref 4.0–10.5)

## 2015-07-30 LAB — RAPID URINE DRUG SCREEN, HOSP PERFORMED
AMPHETAMINES: NOT DETECTED
BENZODIAZEPINES: NOT DETECTED
Barbiturates: NOT DETECTED
COCAINE: NOT DETECTED
OPIATES: NOT DETECTED
Tetrahydrocannabinol: NOT DETECTED

## 2015-07-30 LAB — URINALYSIS, ROUTINE W REFLEX MICROSCOPIC
Bilirubin Urine: NEGATIVE
Glucose, UA: NEGATIVE mg/dL
Ketones, ur: NEGATIVE mg/dL
LEUKOCYTES UA: NEGATIVE
NITRITE: NEGATIVE
PROTEIN: NEGATIVE mg/dL
SPECIFIC GRAVITY, URINE: 1.017 (ref 1.005–1.030)
UROBILINOGEN UA: 0.2 mg/dL (ref 0.0–1.0)
pH: 5 (ref 5.0–8.0)

## 2015-07-30 LAB — COMPREHENSIVE METABOLIC PANEL
ALBUMIN: 3.3 g/dL — AB (ref 3.5–5.0)
ALT: 18 U/L (ref 14–54)
ANION GAP: 7 (ref 5–15)
AST: 19 U/L (ref 15–41)
Alkaline Phosphatase: 57 U/L (ref 38–126)
BUN: 11 mg/dL (ref 6–20)
CHLORIDE: 108 mmol/L (ref 101–111)
CO2: 23 mmol/L (ref 22–32)
Calcium: 8.8 mg/dL — ABNORMAL LOW (ref 8.9–10.3)
Creatinine, Ser: 0.88 mg/dL (ref 0.44–1.00)
GFR calc non Af Amer: >60 mL/min (ref >60)
GLUCOSE: 101 mg/dL — AB (ref 65–99)
Potassium: 4.1 mmol/L (ref 3.5–5.1)
SODIUM: 138 mmol/L (ref 135–145)
Total Bilirubin: 0.3 mg/dL (ref 0.3–1.2)
Total Protein: 6.4 g/dL — ABNORMAL LOW (ref 6.5–8.1)

## 2015-07-30 LAB — I-STAT CG4 LACTIC ACID, ED: LACTIC ACID, VENOUS: 3.07 mmol/L — AB (ref 0.5–2.0)

## 2015-07-30 LAB — ETHANOL: Alcohol, Ethyl (B): <5 mg/dL (ref <5)

## 2015-07-30 LAB — URINE MICROSCOPIC-ADD ON

## 2015-07-30 LAB — LIPASE, BLOOD: Lipase: 25 U/L (ref 22–51)

## 2015-07-30 MED ORDER — DIAZEPAM 20 MG RE GEL: 20.0000 mg | Freq: Once | RECTAL | Status: DC | PRN

## 2015-07-30 MED ORDER — SODIUM CHLORIDE 0.9 % IV BOLUS (SEPSIS)
2000.0000 mL | Freq: Once | INTRAVENOUS | Status: AC
  Administered 2015-07-30: 2000 mL via INTRAVENOUS

## 2015-07-30 NOTE — ED Notes (Signed)
Pt here for new onset seizure, woke up husband while having full body seizure activity, no hx of same, no hx of any neuro issues, had recent cold and took off brand tylenol cold meds yesterday, vss with ems. Had incontinance but no oral trauma noted. Post ictal state noted per ems.

## 2015-07-30 NOTE — ED Provider Notes (Signed)
CSN: [id number]     Arrival date & time 07/30/15  0335 History  This chart was scribed for Tomasita Crumble, MD by Evon Slack, ED Scribe. This patient was seen in room A08C/A08C and the patient's care was started at 3:51 AM.     Chief Complaint  Patient presents with  . Seizures   Patient is a 34 y.o. female presenting with seizures. The history is provided by the patient. No language interpreter was used.  Seizures Seizure activity on arrival: no   Initial focality:  Diffuse Episode characteristics: generalized shaking   Episode characteristics: no tongue biting   Return to baseline: yes   Severity:  Moderate Timing:  Once Progression:  Resolved Recent head injury:  No recent head injuries History of seizures: no    HPI Comments: Lindsay Aguilar is a 34 y.o. female who presents to the Emergency Department complaining of new seizure onset tonight PTA. Pt states that the last thing she remembers was going to bed. She states that the first thing she remembers when she woke up was EMS telling her to get on the stretcher. Pt does report recently having URI symptoms including rhinorrhea. She states that she did take OTC cold medication today. PT doesn't report fever, nausea, vomiting or other related symptoms. There is a clear postictal state described by the husband components of the patient being confused, with heavy breathing. She also had urinary incontinence.   Past Medical History  Diagnosis Date  . Asthma   . 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 Hypertension   . Anemia     only with pregnancy  . Chicken pox   . Frequent headaches   . GERD (gastroesophageal reflux disease)   . Seasonal allergies   . Hypertension   . UTI (urinary tract infection)    Past Surgical History  Procedure Laterality Date  . Cholecystectomy  04/2008  . Wisdom tooth extraction  1997   Family History  Problem  Relation Age of Onset  . Anesthesia problems Other   . Depression Father    Social History  Substance Use Topics  . Smoking status: Former Smoker    Types: Cigarettes    Quit date: 11/16/2006  . Smokeless tobacco: Never Used  . Alcohol Use: No   OB History    Gravida Para Term Preterm AB TAB SAB Ectopic Multiple Living   3 3 3  0 0 0 0 0 0 2     Review of Systems  Neurological: Positive for seizures.  All other systems reviewed and are negative.    Allergies  Amoxicillin  Home Medications   Prior to Admission medications   Medication Sig Start Date End Date Taking? Authorizing Provider  escitalopram (LEXAPRO) 20 MG tablet Take 1 tablet by mouth  daily 09/14/14   Terressa Koyanagi, DO  ferrous sulfate 325 (65 FE) MG tablet Take 325 mg by mouth daily with breakfast.    Historical Provider, MD  pravastatin (PRAVACHOL) 20 MG tablet Take 1 tablet (20 mg total) by mouth daily. 09/17/14   Terressa Koyanagi, DO   BP 122/75 mmHg  Pulse 108  Temp(Src) 98.5 F (36.9 C) (Oral)  Ht 5\' 7"  (1.702 m)  Wt 295 lb (133.811 kg)  BMI 46.19 kg/m2  SpO2 97%   Physical Exam  Constitutional: She is oriented to person, place, and time. She appears well-developed and well-nourished. No distress.  HENT:  Head: Normocephalic and atraumatic.  Nose: Nose normal.  Mouth/Throat: Oropharynx is clear and moist. No oropharyngeal exudate.  Eyes: Conjunctivae and EOM are normal. Pupils are equal, round, and reactive to light. No scleral icterus.  Neck: Normal range of motion. Neck supple. No JVD present. No tracheal deviation present. No thyromegaly present.  Cardiovascular: Normal rate, regular rhythm and normal heart sounds.  Exam reveals no gallop and no friction rub.   No murmur heard. Pulmonary/Chest: Effort normal and breath sounds normal. No respiratory distress. She has no wheezes. She exhibits no tenderness.  Abdominal: Soft. Bowel sounds are normal. She exhibits no distension and no mass. There is no  tenderness. There is no rebound and no guarding.  Musculoskeletal: Normal range of motion. She exhibits no edema or tenderness.  Lymphadenopathy:    She has no cervical adenopathy.  Neurological: She is alert and oriented to person, place, and time. No cranial nerve deficit. She exhibits normal muscle tone.  Skin: Skin is warm and dry. No rash noted. No erythema. No pallor.  Nursing note and vitals reviewed.   ED Course  Procedures (including critical care time) DIAGNOSTIC STUDIES: Oxygen Saturation is 97% on RA, normal by my interpretation.    COORDINATION OF CARE: 3:55 AM-Discussed treatment plan with pt at bedside and pt agreed to plan.     Labs Review Labs Reviewed  CBC WITH DIFFERENTIAL/PLATELET - Abnormal; Notable for the following:    RBC 5.24 (*)    MCV 77.7 (*)    MCH 24.8 (*)    All other components within normal limits  COMPREHENSIVE METABOLIC PANEL - Abnormal; Notable for the following:    Glucose, Bld 101 (*)    Calcium 8.8 (*)    Total Protein 6.4 (*)    Albumin 3.3 (*)    All other components within normal limits  URINALYSIS, ROUTINE W REFLEX MICROSCOPIC (NOT AT [facility]) - Abnormal; Notable for the following:    Hgb urine dipstick SMALL (*)    All other components within normal limits  URINE MICROSCOPIC-ADD ON - Abnormal; Notable for the following:    Squamous Epithelial / LPF FEW (*)    Bacteria, UA FEW (*)    Casts HYALINE CASTS (*)    All other components within normal limits  I-STAT CG4 LACTIC ACID, ED - Abnormal; Notable for the following:    Lactic Acid, Venous 3.07 (*)    All other components within normal limits  LIPASE, BLOOD  URINE RAPID DRUG SCREEN, HOSP PERFORMED  ETHANOL  POC URINE PREG, ED    Imaging Review Dg Chest 2 View  07/30/2015   CLINICAL DATA:  New onset seizure tonight.  EXAM: CHEST  2 VIEW  COMPARISON:  02/15/2014  FINDINGS: The cardiomediastinal contours are normal. The lungs are clear. Pulmonary vasculature is normal. No  consolidation, pleural effusion, or pneumothorax. No acute osseous abnormalities are seen.  IMPRESSION: No acute pulmonary process.   Electronically Signed   By: Rubye Oaks M.D.   On: 07/30/2015 05:06   Ct Head Wo Contrast  07/30/2015   CLINICAL DATA:  New onset seizure.  EXAM: CT HEAD WITHOUT CONTRAST  TECHNIQUE: Contiguous axial images were obtained from the base of the skull through the vertex without intravenous contrast.  COMPARISON:  None.  FINDINGS: No intracranial hemorrhage, mass effect, or midline shift. No hydrocephalus. The basilar cisterns are patent. No evidence of territorial infarct. No intracranial fluid collection. Calvarium is intact. Mild mucosal thickening of the ethmoid air cells and left  frontal sinus. Remaining paranasal sinuses are well-aerated. Mastoid air cells are clear.  IMPRESSION: 1.  No acute intracranial abnormality. 2. Mild paranasal signs inflammatory change.   Electronically Signed   By: Rubye Oaks M.D.   On: 07/30/2015 04:57   I have personally reviewed and evaluated these images and lab results as part of my medical decision-making.   EKG Interpretation None      MDM   Final diagnoses:  None    Patient presents to the emergency department for first-time seizure. Full workup was performed without any etiology of the seizure identified. Since this is the first time she is having a seizure, she will be recommended to follow-up with neurology for further management. I do not believe medications are currently warranted.  Patient had no recurrence of her seizure in the emergency department. She was advised that she will start medication if this occurs again. Her seizure threshold was likely decreased due to her viral URI she currently has going on. Chest x-ray is negative for pneumonia, no infection in her urine. She appears well in no acute distress, neurology follow-up was provided for her. Patient is safe for discharge with stable vital signs.  Tachycardia has resolved after 2 L IV fluids.  I personally performed the services described in this documentation, which was scribed in my presence. The recorded information has been reviewed and is accurate.     Tomasita Crumble, MD 07/30/15 [phone]

## 2015-07-30 NOTE — Discharge Instructions (Signed)
Seizure, Adult Lindsay Aguilar, your workup today for seizures was negative. It is likely your cold infection has brought out the seizure. Since this is her first seizure, we do not recommend any medications. If this happens again please come to emergency department immediately to begin treatment. See neurology within 3 days for close follow-up. If any symptoms worsen come back to the emergency department. Thank you. A seizure means there is unusual activity in the brain. A seizure can cause changes in attention or behavior. Seizures often cause shaking (convulsions). Seizures often last from 30 seconds to 2 minutes. HOME CARE   If you are given medicines, take them exactly as told by your doctor.  Keep all doctor visits as told.  Do not swim or drive until your doctor says it is okay.  Teach others what to do if you have a seizure. They should:  Lay you on the ground.  Put a cushion under your head.  Loosen any tight clothing around your neck.  Turn you on your side.  Stay with you until you get better. GET HELP RIGHT AWAY IF:   The seizure lasts longer than 2 to 5 minutes.  The seizure is very bad.  The person does not wake up after the seizure.  The person's attention or behavior changes. Drive the person to the emergency room or call your local emergency services (911 in [place] MAKE SURE YOU:   Understand these instructions.  Will watch your condition.  Will get help right away if you are not doing well or get worse. Document Released: 04/20/2008 Document Revised: 01/25/2012 Document Reviewed: 10/21/2011 [facility] Patient Information 2015 [place], [place]. This information is not intended to replace advice given to you by your health care provider. Make sure you discuss any questions you have with your health care provider.

## 2015-07-31 ENCOUNTER — Ambulatory Visit (INDEPENDENT_AMBULATORY_CARE_PROVIDER_SITE_OTHER): Payer: 59 | Admitting: Family Medicine

## 2015-07-31 ENCOUNTER — Encounter: Payer: Self-pay | Admitting: Family Medicine

## 2015-07-31 VITALS — BP 102/84 | HR 87 | Temp 98.7°F | Ht 67.0 in | Wt 297.2 lb

## 2015-07-31 DIAGNOSIS — J069 Acute upper respiratory infection, unspecified: Secondary | ICD-10-CM | POA: Diagnosis not present

## 2015-07-31 DIAGNOSIS — R569 Unspecified convulsions: Secondary | ICD-10-CM

## 2015-07-31 NOTE — Progress Notes (Signed)
Pre visit review using our clinic review tool, if applicable. No additional management support is needed unless otherwise documented below in the visit note. 

## 2015-07-31 NOTE — Patient Instructions (Addendum)
See neurology tomorrow as scheduled regarding the seizure and seek care immediately if any further events. Avoid over the counter cold medications for now.  Get flu shot in October.  Schedule physical and come fasting in 3-4 months  INSTRUCTIONS FOR UPPER RESPIRATORY INFECTION:  -plenty of rest and fluids  -nasal saline wash 2-3 times daily (use prepackaged nasal saline or bottled/distilled water if making your own)   -can use AFRIN nasal spray for drainage and nasal congestion - but do NOT use longer then 3-4 days  -can use tylenol (in no history of liver disease) or ibuprofen (if no history of kidney disease, bowel bleeding or significant heart disease) as directed for aches and sorethroat  -in the winter time, using a humidifier at night is helpful (please follow cleaning instructions)  -if you are taking a cough medication - use only as directed, may also try a teaspoon of honey to coat the throat and throat lozenges. If given a cough medication with codeine or h -for sore throat, salt water gargles can help  -follow up if you have fevers, facial pain, tooth pain, difficulty breathing or are worsening or symptoms persist longer then expected  Upper Respiratory Infection, Adult An upper respiratory infection (URI) is also known as the common cold. It is often caused by a type of germ (virus). Colds are easily spread (contagious). You can pass it to others by kissing, coughing, sneezing, or drinking out of the same glass. Usually, you get better in 1 to 3  weeks.  However, the cough can last for even longer. HOME CARE   Only take medicine as told by your doctor. Follow instructions provided above.  Drink enough water and fluids to keep your pee (urine) clear or pale yellow.  Get plenty of rest.  Return to work when your temperature is < 100 for 24 hours or as told by your doctor. You may use a face mask and wash your hands to stop your cold from spreading. GET HELP RIGHT AWAY IF:    After the first few days, you feel you are getting worse.  You have questions about your medicine.  You have chills, shortness of breath, or red spit (mucus).  You have pain in the face for more then 1-2 days, especially when you bend forward.  You have a fever, puffy (swollen) neck, pain when you swallow, or white spots in the back of your throat.  You have a bad headache, ear pain, sinus pain, or chest pain.  You have a high-pitched whistling sound when you breathe in and out (wheezing).  You cough up blood.  You have sore muscles or a stiff neck. MAKE SURE YOU:   Understand these instructions.  Will watch your condition.  Will get help right away if you are not doing well or get worse. Document Released: 04/20/2008 Document Revised: 01/25/2012 Document Reviewed: 02/07/2014 [facility] Patient Information 2015 [place], [place]. This information is not intended to replace advice given to you by your health care provider. Make sure you discuss any questions you have with your health care provider.

## 2015-07-31 NOTE — Progress Notes (Signed)
HPI:  URI: -started: 3 days ago -symptoms:nasal congestion, sore throat, cough - mild and now improving -denies:fever, SOB, NVD, tooth pain -has tried: OTC cold medication -sick contacts/travel/risks: denies flu exposure, tick exposure or or Ebola risks, husband sick with cold too  Of note - eval in ED 9/13 for a witnessed seizure after taking several doses of OTC cold medication, husband and EMS witnessed shaking, biting tongue, awake but not responding to commands, scheduled to see neurology tomorrow ([facility]) for further eval and possible tx, had neg imaging of head and neg eval. They have diazepam. Report neg EKG at the time of event, neg Ct scan. Feels fine today other then achy. Denies: CP, SOB, DOE, palpitations, HA, fevers, nausea, vomiting preceding seizure.  ROS: See pertinent positives and negatives per HPI.  Past Medical History  Diagnosis Date  . Asthma   . 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 Hypertension   . Anemia     only with pregnancy  . Chicken pox   . Frequent headaches   . GERD (gastroesophageal reflux disease)   . Seasonal allergies   . Hypertension   . UTI (urinary tract infection)     Past Surgical History  Procedure Laterality Date  . Cholecystectomy  04/2008  . Wisdom tooth extraction  1997    Family History  Problem Relation Age of Onset  . Anesthesia problems Other   . Depression Father     Social History   Social History  . Marital Status: Married    Spouse Name: N/A  . Number of Children: N/A  . Years of Education: N/A   Social History Main Topics  . Smoking status: Former Smoker    Types: Cigarettes    Quit date: 11/16/2006  . Smokeless tobacco: Never Used  . Alcohol Use: No  . Drug Use: No  . Sexual Activity: Yes    Birth Control/ Protection: None   Other Topics Concern  . 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  diazepam (DIASAT) 20 MG GEL, Place 20 mg rectally once as needed (for seizures)., Disp: 1 Package, Rfl: 0 .  pseudoephedrine-acetaminophen (TYLENOL SINUS) 30-500 MG TABS, Take 1 tablet by mouth every 4 (four) hours as needed (for congestion)., Disp: , Rfl:  .  [DISCONTINUED] pravastatin (PRAVACHOL) 20 MG tablet, Take 1 tablet (20 mg total) by mouth daily. (Patient not taking: Reported on 07/30/2015), Disp: 90 tablet, Rfl: 1  EXAM:  Filed Vitals:   07/31/15 1118  BP: 102/84  Pulse: 87  Temp: 98.7 F (37.1 C)    Body mass index is 46.54 kg/(m^2).  GENERAL: vitals reviewed and listed above, alert, oriented, appears well hydrated and in no acute distress  HEENT: atraumatic, conjunttiva clear, no obvious abnormalities on inspection of external nose and ears, normal appearance of ear canals and TMs, clear nasal congestion, mild post oropharyngeal erythema with PND, no tonsillar edema or exudate, no sinus TTP  NECK: no obvious masses on inspection  LUNGS: clear to auscultation  bilaterally, no wheezes, rales or rhonchi, good air movement  CV: HRRR, no peripheral edema  MS: moves all extremities without noticeable abnormality  PSYCH: pleasant and cooperative, no obvious depression or anxiety  ASSESSMENT AND PLAN:  Discussed the following assessment and plan:  Seizure -we discussed possible serious and likely etiologies, workup and treatment, treatment risks and return precautions;from description of events sound like seizure and not likely cardiac or other event.  -after this discussion, Porschea opted for further evaluation with the neurologist tomorrow  -of course, we advised Jadasia  to  return or notify a doctor immediately if symptoms worsen or persist or new concerns arise.  Acute upper respiratory infection -given HPI and exam findings today, a serious infection or illness is unlikely. We discussed potential etiologies, with VURI being most likely, and advised supportive care and monitoring. We discussed treatment side effects, likely course, antibiotic misuse, transmission, and signs of developing a serious illness. -of course, we advised to return or notify a doctor immediately if symptoms worsen or persist or new concerns arise.    Patient Instructions  See neurology tomorrow as scheduled regarding the seizure and seek care immediately if any further events. Avoid over the counter cold medications for now.  Get flu shot in October.  Schedule physical and come fasting in 3-4 months  INSTRUCTIONS FOR UPPER RESPIRATORY INFECTION:  -plenty of rest and fluids  -nasal saline wash 2-3 times daily (use prepackaged nasal saline or bottled/distilled water if making your own)   -can use AFRIN nasal spray for drainage and nasal congestion - but do NOT use longer then 3-4 days  -can use tylenol (in no history of liver disease) or ibuprofen (if no history of kidney disease, bowel bleeding or significant heart disease) as directed for aches and sorethroat  -in the winter time, using a humidifier at night is helpful (please follow cleaning instructions)  -if you are taking a cough medication - use only as directed, may also try a teaspoon of honey to coat the throat and throat lozenges. If given a cough medication with codeine or h -for sore throat, salt water gargles can help  -follow up if you have fevers, facial pain, tooth pain, difficulty breathing or are worsening or symptoms persist longer then expected  Upper Respiratory Infection, Adult An upper respiratory infection (URI) is also known as the common cold. It is often caused by a type of germ (virus). Colds are  easily spread (contagious). You can pass it to others by kissing, coughing, sneezing, or drinking out of the same glass. Usually, you get better in 1 to 3  weeks.  However, the cough can last for even longer. HOME CARE   Only take medicine as told by your doctor. Follow instructions provided above.  Drink enough water and fluids to keep your pee (urine) clear or pale yellow.  Get plenty of rest.  Return to work when your temperature is < 100 for 24 hours or as told by your doctor. You may use a face mask and wash your hands to stop your cold from spreading. GET HELP RIGHT AWAY IF:   After the first few days, you feel you are getting worse.  You have questions about your medicine.  You have chills, shortness of breath, or red spit (mucus).  You have pain in the face for more then 1-2 days, especially when you bend forward.  You have a fever, puffy (swollen) neck, pain when you swallow, or white spots in the back  of your throat.  You have a bad headache, ear pain, sinus pain, or chest pain.  You have a high-pitched whistling sound when you breathe in and out (wheezing).  You cough up blood.  You have sore muscles or a stiff neck. MAKE SURE YOU:   Understand these instructions.  Will watch your condition.  Will get help right away if you are not doing well or get worse. Document Released: 04/20/2008 Document Revised: 01/25/2012 Document Reviewed: 02/07/2014 [facility] Patient Information 2015 [place], [place]. This information is not intended to replace advice given to you by your health care provider. Make sure you discuss any questions you have with your health care provider.      Kriste Basque R.

## 2015-08-01 ENCOUNTER — Encounter: Payer: Self-pay | Admitting: Neurology

## 2015-08-01 ENCOUNTER — Ambulatory Visit (INDEPENDENT_AMBULATORY_CARE_PROVIDER_SITE_OTHER): Payer: 59 | Admitting: Neurology

## 2015-08-01 VITALS — BP 125/81 | HR 80 | Ht 67.0 in | Wt 296.4 lb

## 2015-08-01 DIAGNOSIS — R569 Unspecified convulsions: Secondary | ICD-10-CM

## 2015-08-01 NOTE — Patient Instructions (Signed)
I had a long discussion with the patient and husband with regards to her solitary episode of seizure in sleep. I discussed at length seizure provoking stimuli and recommend further evaluation by checking an MRI scan of the brain and EEG. I did not break believe starting anticonvulsants at the present time is necessary unless she has abnormality on the tests or has a second episode. The patient's husband was instructed on using diazepam rectal gel if needed. Patient was advised not to drive for the next 3-6 months as per [facility]. She was advised to return for follow-up in 2 months or call earlier if necessary  Seizure, Adult A seizure is abnormal electrical activity in the brain. Seizures usually last from 30 seconds to 2 minutes. There are various types of seizures. Before a seizure, you may have a warning sensation (aura) that a seizure is about to occur. An aura may include the following symptoms:   Fear or anxiety.  Nausea.  Feeling like the room is spinning (vertigo).  Vision changes, such as seeing flashing lights or spots. Common symptoms during a seizure include:  A change in attention or behavior (altered mental status).  Convulsions with rhythmic jerking movements.  Drooling.  Rapid eye movements.  Grunting.  Loss of bladder and bowel control.  Bitter taste in the mouth.  Tongue biting. After a seizure, you may feel confused and sleepy. You may also have an injury resulting from convulsions during the seizure. HOME CARE INSTRUCTIONS   If you are given medicines, take them exactly as prescribed by your health care provider.  Keep all follow-up appointments as directed by your health care provider.  Do not swim or drive or engage in risky activity during which a seizure could cause further injury to you or others until your health care provider says it is OK.  Get adequate rest.  Teach friends and family what to do if you have a seizure. They should:  Lay  you on the ground to prevent a fall.  Put a cushion under your head.  Loosen any tight clothing around your neck.  Turn you on your side. If vomiting occurs, this helps keep your airway clear.  Stay with you until you recover.  Know whether or not you need emergency care. SEEK IMMEDIATE MEDICAL CARE IF:  The seizure lasts longer than 5 minutes.  The seizure is severe or you do not wake up immediately after the seizure.  You have an altered mental status after the seizure.  You are having more frequent or worsening seizures. Someone should drive you to the emergency department or call local emergency services (911 in [place] MAKE SURE YOU:  Understand these instructions.  Will watch your condition.  Will get help right away if you are not doing well or get worse. Document Released: 10/30/2000 Document Revised: 08/23/2013 Document Reviewed: 06/14/2013 [facility] Patient Information 2015 [place], [place]. This information is not intended to replace advice given to you by your health care provider. Make sure you discuss any questions you have with your health care provider.

## 2015-08-01 NOTE — Progress Notes (Addendum)
[facility] [address] [place]. [place] [address] [phone]       OFFICE CONSULT NOTE  [name] Date of Birth:  [birth year] Medical Record Number:  [id number]   Referring MD:  Kriste Basque  Reason for Referral:  seizure  HPI: Ms Lindsay Aguilar is a 34 year old pleasant Caucasian lady who 3 days ago on Monday night had an episode of witnessed seizure in sleep. She is unable to describe the episode and the husband was an eyewitness.. The patient and husband went to bed at the usual time. Her daughter who sleeps with them woke up and shouted.at about 2 AM which woke up the husband  who noticed that his wife had tonic posturing of her upper extremities with tremulousness. Her mouth was closed. She had tongue bite. She had incontinence of urine. The patient  stopped shaking after 3-5 minutes. She appeared to be postictal with  transient disorientation and confusion. She recalls the EMS arriving to the house.and her disorientation started clearing by the time she reached the hospital. She had noncontrast CT scan of the head done on 08/16/2015 which I personally reviewed and is unremarkable except for evidence of incidental sinusitis. Basic metabolic panel labs and CBC were unremarkable. Patient had started a cold medication on that day which include atenolol, guaifenesin and phenylephrine. She has no known prior history of seizures, significant head injury with loss of consciousness, childhood meningitis, neurological illness. She denies drinking or recreational abuse drugs or drinking significant amounts of alcohol. There is no family history of epilepsy either. She feels back to baseline and has no complaints today. She denies sleep deprivation, significant stress, irregular eating and sleeping habits. She does not drink excessive amounts of caffeine   ROS:   14 system review of systems is positive for seizure, loss of consciousness, confusion, loss of memory, all other systems  negative  PMH:  Past Medical History  Diagnosis Date  . Asthma   . 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 Hypertension   . Anemia     only with pregnancy  . Chicken pox   . Frequent headaches   . GERD (gastroesophageal reflux disease)   . Seasonal allergies   . Hypertension   . UTI (urinary tract infection)   . Seizures     Social History:  Social History   Social History  . Marital Status: Married    Spouse Name: N/A  . Number of Children: N/A  . Years of Education: N/A   Occupational History  . Not on file.   Social History Main Topics  . Smoking status: Former Smoker    Types: Cigarettes    Quit date: 11/16/2006  . Smokeless tobacco: Never Used  . Alcohol Use: 0.6 oz/week    1 Glasses of wine per week     Comment: socal drinker  . Drug Use: No  . Sexual Activity: Yes    Birth Control/ Protection: None   Other Topics Concern  . 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 diazepam (DIASAT) 20 MG GEL Place 20 mg rectally once as needed (for seizures). 1 Package 0  . pseudoephedrine-acetaminophen (TYLENOL SINUS) 30-500 MG TABS Take 1 tablet by mouth every 4 (four) hours as needed (for congestion).    . [DISCONTINUED] pravastatin (PRAVACHOL) 20 MG tablet Take 1 tablet (20 mg total) by mouth daily. (Patient not taking: Reported on 07/30/2015) 90 tablet 1   No current facility-administered medications on file prior to visit.    Allergies:   Allergies  Allergen Reactions  . Amoxicillin Other (See  Comments)    Yeast infection    Physical Exam General: Obese young Caucasian lady, seated, in no evident distress Head: head normocephalic and atraumatic.   Neck: supple with no carotid or supraclavicular bruits Cardiovascular: regular rate and rhythm, no murmurs Musculoskeletal: no deformity Skin:  no rash/petichiae Vascular:  Normal pulses all extremities  Neurologic Exam Mental Status: Awake and fully alert. Oriented to place and time. Recent and remote memory intact. Attention span, concentration and fund of knowledge appropriate. Mood and affect appropriate.  Cranial Nerves: Fundoscopic exam reveals sharp disc margins. Pupils equal, briskly reactive to light. Extraocular movements full without nystagmus. Visual fields full to confrontation. Hearing intact. Facial sensation intact. Face, tongue, palate moves normally and symmetrically.  Motor: Normal bulk and tone. Normal strength in all tested extremity muscles. Sensory.: intact to touch , pinprick , position and vibratory sensation.  Coordination: Rapid alternating movements normal in all extremities. Finger-to-nose and heel-to-shin performed accurately bilaterally. Gait and Station: Arises from chair without difficulty. Stance is normal. Gait demonstrates normal stride length and balance . Able to heel, toe and tandem walk without difficulty.  Reflexes: 1+ and symmetric. Toes downgoing.       ASSESSMENT: 9 year Caucasian lady with solitary episode of witnessed generalized seizure in sleep 5 days ago with normal neurological exam. No obvious trigger except recent upper respiratory tract infection and taking cold medications    PLAN: I had a long discussion with the patient and husband with regards to her solitary episode of seizure in sleep. I discussed at length seizure provoking stimuli and recommend further evaluation by checking an MRI scan of the brain and EEG. I do not  believe starting anticonvulsants at the present time  is necessary unless she has abnormality on the tests or has a second episode. The patient's husband was instructed on using diazepam rectal gel if needed. Patient was advised not to drive for the next 3-6 months as per [facility]. She was advised to return for follow-up in 2 months or call earlier if necessary Delia Heady, MD   Note: This document was prepared with digital dictation and possible smart phrase technology. Any transcriptional errors that result from this process are unintentional.

## 2015-08-05 ENCOUNTER — Telehealth: Payer: Self-pay | Admitting: Neurology

## 2015-08-05 NOTE — Telephone Encounter (Signed)
Patient called regarding EEG appt 08/28/15. She recvd notification of this appt through MyChart but hasn't spoken to anyone in the office about this.

## 2015-08-19 ENCOUNTER — Ambulatory Visit
Admission: RE | Admit: 2015-08-19 | Discharge: 2015-08-19 | Disposition: A | Discharge status: Home / Self Care | Payer: 59 | Source: Ambulatory Visit | Attending: Neurology | Admitting: Neurology

## 2015-08-19 DIAGNOSIS — R569 Unspecified convulsions: Secondary | ICD-10-CM | POA: Diagnosis not present

## 2015-08-19 MED ORDER — GADOBENATE DIMEGLUMINE 529 MG/ML IV SOLN
20.0000 mL | Freq: Once | INTRAVENOUS | Status: AC | PRN
  Administered 2015-08-19: 20 mL via INTRAVENOUS

## 2015-08-20 ENCOUNTER — Ambulatory Visit: Payer: 59 | Admitting: Neurology

## 2015-08-22 ENCOUNTER — Ambulatory Visit (INDEPENDENT_AMBULATORY_CARE_PROVIDER_SITE_OTHER): Payer: 59 | Admitting: Neurology

## 2015-08-22 DIAGNOSIS — R569 Unspecified convulsions: Secondary | ICD-10-CM | POA: Diagnosis not present

## 2015-08-22 NOTE — Procedures (Signed)
    History:  Lindsay Aguilar is a 34 year old patient with a history of a witnessed seizure coming out of sleep on 07/29/2015. The patient is being evaluated for this event.  This is a routine EEG. No skull defects are noted. Medications include pravastatin, and Tylenol Sinus.   EEG classification: Normal awake  Description of the recording: The background rhythms of this recording consists of a fairly well modulated medium amplitude alpha rhythm of 9 Hz that is reactive to eye opening and closure. As the record progresses, the patient appears to remain in the waking state throughout the recording. Photic stimulation was performed, resulting in a bilateral and symmetric photic driving response. Hyperventilation was also performed, resulting in a minimal buildup of the background rhythm activities without significant slowing seen. At no time during the recording does there appear to be evidence of spike or spike wave discharges or evidence of focal slowing. EKG monitor shows no evidence of cardiac rhythm abnormalities with a heart rate of 72.  Impression: This is a normal EEG recording in the waking state. No evidence of ictal or interictal discharges are seen.

## 2015-08-23 ENCOUNTER — Encounter: Payer: Self-pay | Admitting: Neurology

## 2015-08-23 ENCOUNTER — Telehealth: Payer: Self-pay | Admitting: Neurology

## 2015-08-23 ENCOUNTER — Telehealth: Payer: Self-pay

## 2015-08-23 NOTE — Telephone Encounter (Signed)
Patient already has had EEG. See note.

## 2015-08-23 NOTE — Telephone Encounter (Signed)
Rn call patient to let her know that the EEG study was normal. Pt verbalized understanding of the test. Patient wanted to know could she drive now. Rn explain Dr Pearlean Brownie would give her a call concerning her approval to drive.

## 2015-08-26 NOTE — Telephone Encounter (Signed)
Dr. Pearlean Brownie spoke to pt on 08-23-15 at 1442, answered her questions about driving.

## 2015-08-28 ENCOUNTER — Other Ambulatory Visit: Payer: 59

## 2015-10-22 ENCOUNTER — Encounter: Payer: Self-pay | Admitting: Neurology

## 2015-10-22 ENCOUNTER — Ambulatory Visit (INDEPENDENT_AMBULATORY_CARE_PROVIDER_SITE_OTHER): Payer: 59 | Admitting: Neurology

## 2015-10-22 VITALS — BP 119/65 | HR 91 | Ht 67.0 in | Wt 298.8 lb

## 2015-10-22 DIAGNOSIS — R569 Unspecified convulsions: Secondary | ICD-10-CM

## 2015-10-22 NOTE — Patient Instructions (Signed)
I had a long discussion with the patient and husband regarding her solitary nocturnal seizure and personally reviewed and discussed MRI and EEG findings with both being normal her lifetime risk of having further unprovoked seizures is only 50%. Hence I do not recommend anticonvulsants unless she has recurrent episodes. I recommend conservative follow-up for now. She was advised to avoid seizure provoking stimuli like sleep deprivation, stimulant medications, alcohol and do activities in moderation. She was cleared to drive but advised to avoid a lot of late night driving. She will return for follow-up in 6 months or call earlier if necessary

## 2015-10-22 NOTE — Progress Notes (Signed)
[facility] [address] [place]. [place] [id number] [phone]       OFFICE FOLLOW UP VISIT NOTE  Lindsay Aguilar Date of Birth:  07/08/1981 Medical Record Number:  [id number]   Referring MD:  Kriste BasqueHannah Kim  Reason for Referral:  seizure  HPI: Lindsay Aguilar is a 34 year old pleasant Caucasian lady who 3 days ago on Monday night had an episode of witnessed seizure in sleep. She is unable to describe the episode and the husband was an eyewitness.. The patient and husband went to bed at the usual time. Her daughter who sleeps with them woke up and shouted.at about 2 AM which woke up the husband  who noticed that his wife had tonic posturing of her upper extremities with tremulousness. Her mouth was closed. She had tongue bite. She had incontinence of urine. The patient  stopped shaking after 3-5 minutes. She appeared to be postictal with  transient disorientation and confusion. She recalls the EMS arriving to the house.and her disorientation started clearing by the time she reached the hospital. She had noncontrast CT scan of the head done on 08/16/2015 which I personally reviewed and is unremarkable except for evidence of incidental sinusitis. Basic metabolic panel labs and CBC were unremarkable. Patient had started a cold medication on that day which include atenolol, guaifenesin and phenylephrine. She has no known prior history of seizures, significant head injury with loss of consciousness, childhood meningitis, neurological illness. She denies drinking or recreational abuse drugs or drinking significant amounts of alcohol. There is no family history of epilepsy either. She feels back to baseline and has no complaints today. She denies sleep deprivation, significant stress, irregular eating and sleeping habits. She does not drink excessive amounts of caffeine  Update 10/22/2015 ;  She returns for follow-up after initial consultation visit 2 and half months ago. She continues to do  well without recurrent seizure episodes. She had an MRI scan of the brain done on 08/19/15 which I personally reviewed and is normal without any structural abnormalities in the brain. EEG done on 08/22/15 is normal without definite epileptiform activity. Patient states she is not had any further seizure-like episodes or any other neurological complaints. She is keen to start driving again soon ROS:   14 system review of systems is positive for fever, ear pain, chest tightness, snoring, headache all other systems negative  PMH:  Past Medical History  Diagnosis Date  . Asthma   . 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 Anemia     only with pregnancy  . Chicken pox   . Frequent headaches   . GERD (gastroesophageal reflux disease)   . Seasonal allergies   . UTI (urinary tract infection)   . Seizures (HCC)     Social History:  Social History   Social History  . Marital Status: Married    Spouse Name: N/A  . Number of Children: N/A  . Years of Education: N/A   Occupational History  . Not on file.   Social History Main Topics  . Smoking status: Former Smoker    Types: Cigarettes    Quit date: 11/16/2006  . Smokeless tobacco: Never Used  . Alcohol Use: 0.6 oz/week    1 Glasses of wine per week     Comment: socal drinker  . Drug Use: No  . Sexual Activity: Yes    Birth Control/ Protection: None   Other  Topics Concern  . 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 diazepam (DIASAT) 20 MG GEL Place 20 mg  rectally once as needed (for seizures). 1 Package 0  . [DISCONTINUED] pravastatin (PRAVACHOL) 20 MG tablet Take 1 tablet (20 mg total) by mouth daily. (Patient not taking: Reported on 07/30/2015) 90 tablet 1   No current facility-administered medications on file prior to visit.    Allergies:   Allergies  Allergen Reactions  . Amoxicillin Other (See Comments)    Yeast infection    Physical Exam General: Obese young Caucasian lady, seated, in no evident distress Head: head normocephalic and atraumatic.   Neck: supple with no carotid or supraclavicular bruits Cardiovascular: regular rate and rhythm, no murmurs Musculoskeletal: no deformity Skin:  no rash/petichiae Vascular:  Normal pulses all extremities  Neurologic Exam Mental Status: Awake and fully alert. Oriented to place and time. Recent and remote memory intact. Attention span, concentration and fund of knowledge appropriate. Mood and affect appropriate.  Cranial Nerves: Fundoscopic exam not done . Pupils equal, briskly reactive to light. Extraocular movements full without nystagmus. Visual fields full to confrontation. Hearing intact. Facial sensation intact. Face, tongue, palate moves normally and symmetrically.  Motor: Normal bulk and tone. Normal strength in all tested extremity muscles. Sensory.: intact to touch , pinprick , position and vibratory sensation.  Coordination: Rapid alternating movements normal in all extremities. Finger-to-nose and heel-to-shin performed accurately bilaterally. Gait and Station: Arises from chair without difficulty. Stance is normal. Gait demonstrates normal stride length and balance . Able to heel, toe and tandem walk without difficulty.  Reflexes: 1+ and symmetric. Toes downgoing.       ASSESSMENT: 28 year Caucasian lady with solitary episode of witnessed generalized seizure in sleep 5 days ago with normal neurological exam. No obvious trigger except recent upper respiratory tract infection  and taking cold medications    PLAN: II had a long discussion with the patient and husband regarding her solitary nocturnal seizure and personally reviewed and discussed MRI and EEG findings with both being normal her lifetime risk of having further unprovoked seizures is only 50%. Hence I do not recommend anticonvulsants unless she has recurrent episodes. I recommend conservative follow-up for now. She was advised to avoid seizure provoking stimuli like sleep deprivation, stimulant medications, alcohol and do activities in moderation. She was cleared to drive but advised to avoid a lot of late night driving. She will return for follow-up in 6 months or call earlier if necessary  Delia Heady, MD   Note: This document was prepared with digital dictation and possible smart phrase technology. Any transcriptional errors that result from this process are unintentional.

## 2016-01-08 LAB — BASIC METABOLIC PANEL: GLUCOSE: 94 mg/dL

## 2016-01-08 LAB — LIPID PANEL
Cholesterol: 217 mg/dL — AB (ref 0–200)
HDL: 31 mg/dL — AB (ref 35–70)
LDL CALC: 158 mg/dL
TRIGLYCERIDES: 138 mg/dL (ref 40–160)

## 2016-01-08 LAB — HEMOGLOBIN A1C: Hemoglobin A1C: 5.6

## 2016-01-10 ENCOUNTER — Encounter: Payer: Self-pay | Admitting: Family Medicine

## 2016-02-12 ENCOUNTER — Encounter (HOSPITAL_COMMUNITY): Payer: Self-pay | Admitting: Emergency Medicine

## 2016-02-12 ENCOUNTER — Emergency Department (HOSPITAL_COMMUNITY)
Admission: EM | Admit: 2016-02-12 | Discharge: 2016-02-12 | Disposition: A | Discharge status: Home / Self Care | Payer: 59 | Attending: Emergency Medicine | Admitting: Emergency Medicine

## 2016-02-12 DIAGNOSIS — Z8719 Personal history of other diseases of the digestive system: Secondary | ICD-10-CM | POA: Diagnosis not present

## 2016-02-12 DIAGNOSIS — R410 Disorientation, unspecified: Secondary | ICD-10-CM | POA: Insufficient documentation

## 2016-02-12 DIAGNOSIS — R569 Unspecified convulsions: Secondary | ICD-10-CM | POA: Diagnosis not present

## 2016-02-12 DIAGNOSIS — D649 Anemia, unspecified: Secondary | ICD-10-CM | POA: Diagnosis not present

## 2016-02-12 DIAGNOSIS — Z87891 Personal history of nicotine dependence: Secondary | ICD-10-CM | POA: Insufficient documentation

## 2016-02-12 DIAGNOSIS — R32 Unspecified urinary incontinence: Secondary | ICD-10-CM | POA: Insufficient documentation

## 2016-02-12 DIAGNOSIS — R000 Tachycardia, unspecified: Secondary | ICD-10-CM | POA: Diagnosis not present

## 2016-02-12 DIAGNOSIS — Z87440 Personal history of urinary (tract) infections: Secondary | ICD-10-CM | POA: Insufficient documentation

## 2016-02-12 DIAGNOSIS — Z880 Allergy status to penicillin: Secondary | ICD-10-CM | POA: Insufficient documentation

## 2016-02-12 DIAGNOSIS — Z8619 Personal history of other infectious and parasitic diseases: Secondary | ICD-10-CM | POA: Diagnosis not present

## 2016-02-12 DIAGNOSIS — I509 Heart failure, unspecified: Secondary | ICD-10-CM | POA: Diagnosis not present

## 2016-02-12 LAB — CBC WITH DIFFERENTIAL/PLATELET
BASOS ABS: 0 10*3/uL (ref 0.0–0.1)
BASOS PCT: 0 %
EOS PCT: 0 %
Eosinophils Absolute: 0.1 10*3/uL (ref 0.0–0.7)
HCT: 37.3 % (ref 36.0–46.0)
Hemoglobin: 11.8 g/dL — ABNORMAL LOW (ref 12.0–15.0)
LYMPHS ABS: 2.1 10*3/uL (ref 0.7–4.0)
LYMPHS PCT: 14 %
MCH: 24 pg — ABNORMAL LOW (ref 26.0–34.0)
MCHC: 31.6 g/dL (ref 30.0–36.0)
MCV: 76 fL — AB (ref 78.0–100.0)
MONOS PCT: 3 %
Monocytes Absolute: 0.5 10*3/uL (ref 0.1–1.0)
Neutro Abs: 12.1 10*3/uL — ABNORMAL HIGH (ref 1.7–7.7)
Neutrophils Relative %: 83 %
Platelets: 334 10*3/uL (ref 150–400)
RBC: 4.91 MIL/uL (ref 3.87–5.11)
RDW: 14 % (ref 11.5–15.5)
WBC: 14.7 10*3/uL — ABNORMAL HIGH (ref 4.0–10.5)

## 2016-02-12 LAB — BASIC METABOLIC PANEL
Anion gap: 10 (ref 5–15)
BUN: 7 mg/dL (ref 6–20)
CHLORIDE: 106 mmol/L (ref 101–111)
CO2: 22 mmol/L (ref 22–32)
CREATININE: 0.72 mg/dL (ref 0.44–1.00)
Calcium: 8.9 mg/dL (ref 8.9–10.3)
GFR calc Af Amer: >60 mL/min (ref >60)
GFR calc non Af Amer: >60 mL/min (ref >60)
GLUCOSE: 106 mg/dL — AB (ref 65–99)
POTASSIUM: 4.3 mmol/L (ref 3.5–5.1)
Sodium: 138 mmol/L (ref 135–145)

## 2016-02-12 LAB — URINE MICROSCOPIC-ADD ON

## 2016-02-12 LAB — URINALYSIS, ROUTINE W REFLEX MICROSCOPIC
BILIRUBIN URINE: NEGATIVE
GLUCOSE, UA: NEGATIVE mg/dL
Ketones, ur: NEGATIVE mg/dL
Leukocytes, UA: NEGATIVE
Nitrite: NEGATIVE
PH: 5.5 (ref 5.0–8.0)
Protein, ur: 100 mg/dL — AB
SPECIFIC GRAVITY, URINE: 1.017 (ref 1.005–1.030)

## 2016-02-12 LAB — RAPID URINE DRUG SCREEN, HOSP PERFORMED
AMPHETAMINES: NOT DETECTED
BENZODIAZEPINES: NOT DETECTED
Barbiturates: NOT DETECTED
COCAINE: NOT DETECTED
Opiates: NOT DETECTED
Tetrahydrocannabinol: NOT DETECTED

## 2016-02-12 MED ORDER — IBUPROFEN 800 MG PO TABS
800.0000 mg | ORAL_TABLET | Freq: Once | ORAL | Status: AC
  Administered 2016-02-12: 800 mg via ORAL
  Filled 2016-02-12: qty 1

## 2016-02-12 NOTE — ED Notes (Signed)
Per ems-Pts daughter 89 yr old daughter called her dad to tell him "her mom was not acting right." upon ems arrival pt was found sitting on her couch confused and incontinent of urine. Daughter told ems that pt was laying down and would not talk to her. Pt states she had one seizure last September that her pcp thought was related to a cold medicine. Pt is alert and ox4. Pt does have dryed blood around her left nostril and c/o of a dull headache.

## 2016-02-12 NOTE — ED Provider Notes (Signed)
CSN: [id number]     Arrival date & time 02/12/16  1357 History   First MD Initiated Contact with Patient 02/12/16 1443     Chief Complaint  Patient presents with  . Seizures   (Consider location/radiation/quality/duration/timing/severity/associated sxs/prior Treatment) Patient is a 35 y.o. female presenting with seizures. The history is provided by the patient and the spouse. No language interpreter was used.  Seizures   [name] is a 35 y.o female with a history of CHF, seizures, UTI who presents via EMS for possible seizure-like activity while at home in bed. She was sleeping with her 36-year-old daughter who called the patient's husband did tell him that mom was not acting right. He then called EMS and daughter let them in. Upon EMS arrival the patient was sitting on the couch confused and incontinent of urine. Daughter stated that the mom would not talk to her. Daughter could not give details such as shaking or the amount of time to the patient did not respond. She has had 1 previous seizure in September 2016. It was thought to be caused by the cold medicine she was taking. She was followed by neurology and cleared. Today she was found with dry blood in the left nostril. She states she feels back to her normal self but has a headache. She denies any chest pain, blurry vision, tongue bite, shortness of breath, abdominal pain, nausea, vomiting, diarrhea, new medications, or illegal drug use. She has a [employer] fast streamerMirena as birth control.   Past Medical History  Diagnosis Date  . Anemia     only with pregnancy  . Chicken pox   . Frequent headaches   . GERD (gastroesophageal reflux disease)   . Seasonal allergies   . UTI (urinary tract infection)   . Seizures (HCC)   . 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 Cholecystectomy  04/2008  . Wisdom tooth  extraction  1997   Family History  Problem Relation Age of Onset  . Anesthesia problems Other   . Depression Father    Social History  Substance Use Topics  . Smoking status: Former Smoker    Types: Cigarettes    Quit date: 11/16/2006  . Smokeless tobacco: Never Used  . Alcohol Use: 0.6 oz/week    1 Glasses of wine per week     Comment: socal drinker   OB History    Gravida Para Term Preterm AB TAB SAB Ectopic Multiple Living   3 3 3  0 0 0 0 0 0 2     Review of Systems  Constitutional: Negative for fever and chills.  Neurological: Positive for seizures.  All other systems reviewed and are negative.     Allergies  Amoxicillin  Home Medications   Prior to Admission medications   Medication Sig Start Date End Date Taking? Authorizing Provider  diazepam (DIASAT) 20 MG GEL Place 20 mg rectally once as needed (for seizures). 07/30/15   Tomasita CrumbleAdeleke Oni, MD   BP 119/78 mmHg  Pulse 104  Temp(Src) 98 F (36.7 C) (Oral)  Resp 20  Ht 5\' 6"  (1.676 m)  Wt 135.172 kg  BMI 48.12 kg/m2  SpO2 95% Physical Exam  Constitutional: She is oriented to person, place, and time. She appears well-developed and well-nourished.  Morbidly obese.  HENT:  Head: Normocephalic and atraumatic.  Eyes: Conjunctivae are normal.  Neck: Normal range of  motion. Neck supple.  Cardiovascular: Normal rate, regular rhythm and normal heart sounds.   No murmur heard. RRR.  Pulmonary/Chest: Effort normal and breath sounds normal. No respiratory distress. She has no wheezes. She has no rales.  Lungs clear to auscultation bilaterally.  Abdominal: Soft. There is no tenderness.  No abdominal tenderness to palpation.  Musculoskeletal: Normal range of motion.  Neurological: She is alert and oriented to person, place, and time.  GCS 15. Cranial nerves III through XII intact. No sensory or motor deficit. Able to answer questions appropriately.  Skin: Skin is warm and dry.  Nursing note and vitals  reviewed.   ED Course  Procedures (including critical care time) Labs Review Labs Reviewed  URINALYSIS, ROUTINE W REFLEX MICROSCOPIC (NOT AT [facility]) - Abnormal; Notable for the following:    Hgb urine dipstick MODERATE (*)    Protein, ur 100 (*)    All other components within normal limits  BASIC METABOLIC PANEL - Abnormal; Notable for the following:    Glucose, Bld 106 (*)    All other components within normal limits  CBC WITH DIFFERENTIAL/PLATELET - Abnormal; Notable for the following:    WBC 14.7 (*)    Hemoglobin 11.8 (*)    MCV 76.0 (*)    MCH 24.0 (*)    Neutro Abs 12.1 (*)    All other components within normal limits  URINE MICROSCOPIC-ADD ON - Abnormal; Notable for the following:    Squamous Epithelial / LPF 0-5 (*)    Bacteria, UA FEW (*)    Casts HYALINE CASTS (*)    All other components within normal limits  URINE RAPID DRUG SCREEN, HOSP PERFORMED    Imaging Review No results found. I have personally reviewed and evaluated these lab results as part of my medical decision-making.  ED ECG REPORT   Date: 02/12/2016  Rate: 112  Rhythm: sinus tachycardia  QRS Axis: left  Intervals: normal  ST/T Wave abnormalities: normal  Conduction Disutrbances:none  Narrative Interpretation:   Old EKG Reviewed: unchanged  I have personally reviewed the EKG tracing and agree with the computerized printout as noted.   MDM   Final diagnoses:  Seizure (HCC)   She presents for possible seizure. Back to baseline now. She is slightly tachycardic but otherwise vitals are stable. She has a white blood cell count of 14.7 and hemoglobin of 11.8. She is mildly anemic. Otherwise, labs are not concerning. UA is normal. EKG is comparable to previous. Unknown etiology of seizure. Patient denies doing any illegal drugs or taking any new medications. I discussed that she cannot drive until cleared by neurology. They can follow up with her established neurologist from her first seizure. Return  precautions were discussed and patient agrees with plan.    Catha Gosselin, PA-C 02/12/16 1730  Nelva Nay, MD 02/13/16 2132

## 2016-02-12 NOTE — Discharge Instructions (Signed)
Seizure, Adult Do not drive until you are cleared by neurology.  A seizure is abnormal electrical activity in the brain. Seizures usually last from 30 seconds to 2 minutes. There are various types of seizures. Before a seizure, you may have a warning sensation (aura) that a seizure is about to occur. An aura may include the following symptoms:   Fear or anxiety.  Nausea.  Feeling like the room is spinning (vertigo).  Vision changes, such as seeing flashing lights or spots. Common symptoms during a seizure include:  A change in attention or behavior (altered mental status).  Convulsions with rhythmic jerking movements.  Drooling.  Rapid eye movements.  Grunting.  Loss of bladder and bowel control.  Bitter taste in the mouth.  Tongue biting. After a seizure, you may feel confused and sleepy. You may also have an injury resulting from convulsions during the seizure. HOME CARE INSTRUCTIONS   If you are given medicines, take them exactly as prescribed by your health care provider.  Keep all follow-up appointments as directed by your health care provider.  Do not swim or drive or engage in risky activity during which a seizure could cause further injury to you or others until your health care provider says it is OK.  Get adequate rest.  Teach friends and family what to do if you have a seizure. They should:  Lay you on the ground to prevent a fall.  Put a cushion under your head.  Loosen any tight clothing around your neck.  Turn you on your side. If vomiting occurs, this helps keep your airway clear.  Stay with you until you recover.  Know whether or not you need emergency care. SEEK IMMEDIATE MEDICAL CARE IF:  The seizure lasts longer than 5 minutes.  The seizure is severe or you do not wake up immediately after the seizure.  You have an altered mental status after the seizure.  You are having more frequent or worsening seizures. Someone should drive you to  the emergency department or call local emergency services (911 in [place] MAKE SURE YOU:  Understand these instructions.  Will watch your condition.  Will get help right away if you are not doing well or get worse.   This information is not intended to replace advice given to you by your health care provider. Make sure you discuss any questions you have with your health care provider.   Document Released: 10/30/2000 Document Revised: 11/23/2014 Document Reviewed: 06/14/2013 Elsevier Interactive Patient Education [employer]2016 Elsevier Inc.

## 2016-02-13 ENCOUNTER — Telehealth: Payer: Self-pay | Admitting: Neurology

## 2016-02-13 NOTE — Telephone Encounter (Addendum)
Rn call patient about earlier appt with Dr.Sethi. Rn stated per Dr.Sethi patient can see Carolyn(NP). Pt had seizure 3/292017 and was not admitted the same day. PT stated she wanted to be seen today. Rn explain Dr.Sethi does not have any appointments and Carolyn(NP) is not in the office this week. Rn reminded patient to bring her insurance card and co payment. Rn told pt to arrive at 0300pm for check in. Pt schedule with Carolyn(NP) on 02/17/2016.

## 2016-02-13 NOTE — Telephone Encounter (Signed)
Pt called back requesting to be seen today. She was seen in ED yesterday 02/12/16 and was instructed to make appt with Dr Anne HahnWillis and be seen in 2 days. Protocol relayed to pt she should see Dr Pearlean BrownieSethi since she is established with him. Could he see her tomorrow at 4812??

## 2016-02-13 NOTE — Telephone Encounter (Signed)
Patient called to schedule appointment with Dr. Pearlean BrownieSethi today for seizure follow up 02/12/16.

## 2016-02-13 NOTE — Telephone Encounter (Signed)
If patient calls back, let her know Dr.Sethi will be reviewing her chart. He is seeing patients today. Thanks. The RN or Dr. Pearlean BrownieSethi will call her back today. Thanks

## 2016-02-13 NOTE — Telephone Encounter (Signed)
I have reviewed ER MD note. Looks like this is her second seizure. She needs to be seen and started on anticonvulsants. She cannot drive. If my schedule is full ok for NP to see in 1-2 weeks

## 2016-02-15 HISTORY — PX: LAPAROSCOPIC TUBAL LIGATION: SUR803

## 2016-02-17 ENCOUNTER — Encounter: Payer: Self-pay | Admitting: Nurse Practitioner

## 2016-02-17 ENCOUNTER — Ambulatory Visit (INDEPENDENT_AMBULATORY_CARE_PROVIDER_SITE_OTHER): Payer: 59 | Admitting: Nurse Practitioner

## 2016-02-17 VITALS — HR 74 | Resp 16 | Ht 67.0 in | Wt 300.2 lb

## 2016-02-17 DIAGNOSIS — G4710 Hypersomnia, unspecified: Secondary | ICD-10-CM | POA: Diagnosis not present

## 2016-02-17 DIAGNOSIS — R569 Unspecified convulsions: Secondary | ICD-10-CM

## 2016-02-17 DIAGNOSIS — R400 Somnolence: Secondary | ICD-10-CM | POA: Insufficient documentation

## 2016-02-17 MED ORDER — LEVETIRACETAM 500 MG PO TABS: 500.0000 mg | ORAL_TABLET | Freq: Two times a day (BID) | ORAL | Status: DC

## 2016-02-17 NOTE — Progress Notes (Signed)
I have read the note, and I agree with the clinical assessment and plan.  WILLIS,CHARLES KEITH   

## 2016-02-17 NOTE — Patient Instructions (Signed)
Will set up for sleep evaluation Begin Keppra 500mg  twice daily 12 hours apart No driving for 6 months F/U in 3 months

## 2016-02-17 NOTE — Progress Notes (Signed)
[facility]  PATIENT: Lindsay Aguilar DOB: Apr 02, 1981   REASON FOR VISIT: Follow-up for seizure HISTORY FROM: Patient and husband    HISTORY OF PRESENT ILLNESS: HISTORY PSMs Lindsay Aguilar is a 35 year old pleasant Caucasian lady who 3 days ago on Monday night had an episode of witnessed seizure in sleep. She is unable to describe the episode and the husband was an eyewitness.. The patient and husband went to bed at the usual time. Her daughter who sleeps with them woke up and shouted.at about 2 AM which woke up the husband who noticed that his wife had tonic posturing of her upper extremities with tremulousness. Her mouth was closed. She had tongue bite. She had incontinence of urine. The patient stopped shaking after 3-5 minutes. She appeared to be postictal with transient disorientation and confusion. She recalls the EMS arriving to the house.and her disorientation started clearing by the time she reached the hospital. She had noncontrast CT scan of the head done on 08/16/2015 which I personally reviewed and is unremarkable except for evidence of incidental sinusitis. Basic metabolic panel labs and CBC were unremarkable. Patient had started a cold medication on that day which include atenolol, guaifenesin and phenylephrine. She has no known prior history of seizures, significant head injury with loss of consciousness, childhood meningitis, neurological illness. She denies drinking or recreational abuse drugs or drinking significant amounts of alcohol. There is no family history of epilepsy either. She feels back to baseline and has no complaints today. She denies sleep deprivation, significant stress, irregular eating and sleeping habits. She does not drink excessive amounts of caffeine Update 10/22/2015 ; PSShe returns for follow-up after initial consultation visit 2 and half months ago. She continues to do well without recurrent seizure episodes. She had an MRI scan of the brain  done on 08/19/15 which I personally reviewed and is normal without any structural abnormalities in the brain. EEG done on 08/22/15 is normal without definite epileptiform activity. Patient states she is not had any further seizure-like episodes or any other neurological complaints. She is keen to start driving again soon UPDATE 02/17/2016 Ms  Lindsay Aguilar, 35 year old female returns for follow-up. She has a history of 2 seizure events since September both occurred while sleeping. Her most recent seizure on 02/12/16 occurred while she was napping in the afternoon with her daughter. She had some generalized shaking of her extremities that lasted approximately 3-4 minutes with loss of consciousness and incontinence of urine. She is markedly obese and may have sleep apnea. After her most recent event she needs anticonvulsant medications. MRI of the brain in the past has been normal. EEG has been normal  REVIEW OF SYSTEMS: Full 14 system review of systems performed and notable only for those listed, all others are neg:  Constitutional: neg  Cardiovascular: neg Ear/Nose/Throat: neg  Skin: neg Eyes: neg Respiratory: neg Gastroitestinal: neg  Hematology/Lymphatic: neg  Endocrine: neg Musculoskeletal:neg Allergy/Immunology: neg Neurological: Seizure event Psychiatric: neg Sleep : Snoring, daytime sleepiness     ALLERGIES: Allergies  Allergen Reactions  . Amoxicillin Other (See Comments)    Yeast infection    HOME MEDICATIONS: Outpatient Prescriptions Prior to Visit  Medication Sig Dispense Refill  . diazepam (DIASAT) 20 MG GEL Place 20 mg rectally once as needed (for seizures). 1 Package 0   No facility-administered medications prior to visit.    PAST MEDICAL HISTORY: Past Medical History  Diagnosis Date  . Anemia     only with pregnancy  . Chicken pox   .  Frequent headaches   . GERD (gastroesophageal reflux disease)   . Seasonal allergies   . UTI (urinary tract infection)   .  Seizures (HCC)   . 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 Cholecystectomy  04/2008  . Wisdom tooth extraction  1997    FAMILY HISTORY: Family History  Problem Relation Age of Onset  . Anesthesia problems Other   . Depression Father     SOCIAL HISTORY: Social History   Social History  . Marital Status: Married    Spouse Name: N/A  . Number of Children: N/A  . Years of Education: N/A   Occupational History  . Not on file.   Social History Main Topics  . Smoking status: Former Smoker    Types: Cigarettes    Quit date: 11/16/2006  . Smokeless tobacco: Never Used  . Alcohol Use: 0.6 oz/week    1 Glasses of wine per week     Comment: socal drinker  . Drug Use: No  . Sexual Activity: Yes    Birth Control/ Protection: None   Other Topics Concern  . 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 General: Obese young Caucasian lady, seated, in no evident distress Head: head normocephalic and atraumatic.  Neck: supple with no carotid or supraclavicular bruits , neck circumference 17.5 Cardiovascular: regular rate and rhythm, no murmurs Musculoskeletal: no deformity Skin: no rash/petichiae Vascular: Normal pulses all extremities  Neurologic Exam Mental Status: Awake and fully alert. Oriented to place and time. Recent and remote memory intact.  Attention span, concentration and fund of knowledge appropriate. Mood and affect appropriate. ESS 5.  Cranial Nerves: Fundoscopic exam not done . Pupils equal, briskly reactive to light. Extraocular movements full without nystagmus. Visual fields full to confrontation. Hearing intact. Facial sensation intact. Face, tongue, palate moves normally and symmetrically.  Motor: Normal bulk and tone. Normal strength in all tested extremity muscles. Sensory.: intact to touch , pinprick , position and vibratory sensation.  Coordination: Rapid alternating movements normal in all extremities. Finger-to-nose and heel-to-shin performed accurately bilaterally. Gait and Station: Arises from chair without difficulty. Stance is normal. Gait demonstrates normal stride length and balance . Able to heel, toe and tandem walk without difficulty.  Reflexes: 1+ and symmetric. Toes downgoing.     DIAGNOSTIC DATA (LABS, IMAGING, TESTING) - I reviewed patient records, labs, notes, testing and imaging myself where available.  Lab Results  Component Value Date   WBC 14.7* 02/12/2016   HGB 11.8* 02/12/2016   HCT 37.3 02/12/2016   MCV 76.0* 02/12/2016   PLT 334 02/12/2016      Component Value Date/Time   Aguilar 138 02/12/2016 1558   K 4.3 02/12/2016 1558   CL 106 02/12/2016  1558   CO2 22 02/12/2016 1558   GLUCOSE 106* 02/12/2016 1558   BUN 7 02/12/2016 1558   CREATININE 0.72 02/12/2016 1558   CALCIUM 8.9 02/12/2016 1558   PROT 6.4* 07/30/2015 0350   ALBUMIN 3.3* 07/30/2015 0350   AST 19 07/30/2015 0350   ALT 18 07/30/2015 0350   ALKPHOS 57 07/30/2015 0350   BILITOT 0.3 07/30/2015 0350   GFRNONAA >60 02/12/2016 1558   GFRAA >60 02/12/2016 1558   Lab Results  Component Value Date   CHOL 217* 01/08/2016   HDL 31* 01/08/2016   LDLCALC 158 01/08/2016   LDLDIRECT 160.6 01/04/2014   TRIG 138 01/08/2016   CHOLHDL 6 09/14/2014   Lab Results  Component Value Date   HGBA1C 5.6 01/08/2016        ASSESSMENT AND PLAN  35 y.o. year old female  has a past medical history of 2 separate seizure events in the past 6 months both while sleeping. She is morbidly obese. With her most recent seizure she had generalized jerking of the extremities loss of consciousness and incontinence of urine. MRI of the brain in the past has been normal EEG has been normal. The patient is a current patient of Dr. Pearlean Brownie who is out of the office today . This note is sent to the work in doctor.     Discussed with Dr. Anne Hahn Will set up for sleep evaluation Begin Keppra  twice daily 12 hours apart No driving for 6 months Discussed seizure precautions Additional 10 minutes spent answering questions for patient and husband F/U in 3 monthsVst time 30 min Nilda Riggs, [facility], [facility], APRN  [facility] [address], [address] [place], [place] [address] [phone]

## 2016-02-18 ENCOUNTER — Telehealth: Payer: Self-pay | Admitting: Neurology

## 2016-02-18 NOTE — Telephone Encounter (Signed)
Per Enid Skeens Martin, NP spoke with patient and advised her that information does not suggest MVI is recommended, however she may take one in the prescribed amount. She verbalized understanding, appreciation.

## 2016-02-18 NOTE — Telephone Encounter (Signed)
Patient called, states NP Darrol Angelarolyn Martin prescribed KEPPRA and several people have told her that she should be on vitamin B or a muti vitamin with this medication, please call to advise.

## 2016-02-18 NOTE — Telephone Encounter (Signed)
There is nothing in Up to date that suggests a multivitamin is recommended however it will not hurt as long as you take the prescribed amount. Usually people get enough vitamins in their food choices.

## 2016-02-24 ENCOUNTER — Telehealth: Payer: Self-pay | Admitting: Neurology

## 2016-02-24 NOTE — Telephone Encounter (Signed)
keppra typically does not do that. She needs to see her primary MD for evaluation

## 2016-02-24 NOTE — Telephone Encounter (Signed)
Message sent to Dr. Sethi. 

## 2016-02-24 NOTE — Telephone Encounter (Signed)
Patient is calling and states she started on Keppra last week and is retaining a lot of fluid. Does she need a diuretic?  Please call.

## 2016-02-24 NOTE — Telephone Encounter (Signed)
Rn call patient back about the swelling in her feet,and hands. Rn stated per Dr.Sethi note keppra does not cause edema. Rn stated per Dr. Pearlean BrownieSethi she should call her PCP for further evaluation. Pt is schedule tor a tubal ligation next month. Pt verbalized understanding, and will be seeing Dr. Oscar Laohmeiier for sleep consult

## 2016-02-25 ENCOUNTER — Ambulatory Visit (INDEPENDENT_AMBULATORY_CARE_PROVIDER_SITE_OTHER): Payer: 59 | Admitting: Neurology

## 2016-02-25 ENCOUNTER — Encounter: Payer: Self-pay | Admitting: Neurology

## 2016-02-25 DIAGNOSIS — K219 Gastro-esophageal reflux disease without esophagitis: Secondary | ICD-10-CM

## 2016-02-25 DIAGNOSIS — R0683 Snoring: Secondary | ICD-10-CM

## 2016-02-25 DIAGNOSIS — S0340XA Sprain of jaw, unspecified side, initial encounter: Secondary | ICD-10-CM

## 2016-02-25 MED ORDER — OMEPRAZOLE MAGNESIUM 20 MG PO TBEC: 20.0000 mg | DELAYED_RELEASE_TABLET | Freq: Every day | ORAL | Status: DC

## 2016-02-25 NOTE — Patient Instructions (Signed)

## 2016-02-25 NOTE — Progress Notes (Signed)
[facility]EP [facility]INE CL[facility]IC   Provider:  Melvyn Novas, M D  Referring Provider: Terressa Koyanagi, DO Primary Care Physician:  Terressa Koyanagi., DO  Chief Complaint  Patient presents with  . New Patient (Initial Visit)    never had sleep study, snores, and has seizures at night, rm 11, alone    HPI:  Lindsay Aguilar is a 35 y.o. female , seen here as a referral  from NP Darrol Angel and Dr. Selena Batten for a sleep consultation,   Chief complaint according to patient : " I am sleepy, have been all my life " " I am exhausted since being on Keppra "  Lindsay Aguilar is seen here today in a sleep consultation but revealed that she had just started a antiseizure medicine, Keppra, last Tuesday. She is currently taking Keppra 500 mg twice daily, she is also on Nexium and a vitamin B complex. She has seen Darrol Angel on 02/17/2016. This was 3 days after a possible seizure which was witnessed during her sleep. The patient and husband had went to bed at this usual time a daughter that sleep with him them woke up. This was the first seizure 6 months prior now she had a second seizure on 02/12/2016 and therefore medication was initiated. It again occurred during sleep. Her  daughter witnessed a nose bleed and face timed her father , who in return called EMS.    Sleep habits are as follows: Week days the couple goes to bed at 9 PM but stays awake until 11 PM. She usually watches TV, plays on the smart phone or crochets.  She likes to sleep on her side or prone,one pillow . The bedrrom  Is quiet cool and dark after 11 PM.  Her husband has sleep apnea and usually has no trouble going to bed and to sleep at all. Lindsay Aguilar often crochets or reads etc. to keep awake. Once she is asleep she will wake up usually about twice at night to go to the bathroom, usually there is no other reason for her to wake up she does not dream vividly, she does not have frequent nightmares nor act out dreams. She is not known to be a  sleep walker, she is also not sleep talking. She wakes after her alarm wakes her at about 8:30 on school mornings. She is not restored, wakes with no headaches but a dry mouth. 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 Husband sleeps through. On weekends  She can sleep until 10 if no appointments are made.   Sleep medical history and family sleep history:  Nobody is diagnosed, but  father and paternal grandfather were snorers.  No siblings affected.  Social history: married with 3 children.  Caffeine - one cup a day,  No tea , one soda .  Review of Systems: Out of a complete 14 system review, the patient complains of only the following symptoms, and all other reviewed systems are negative. snoring , irregular breathing , no apnea witnessed. Restless sleep.   Epworth score 7 , Fatigue severity score 23   , depression score n/a    Social History   Social History  . Marital Status: Married    Spouse Name: N/A  . Number of Children: N/A  . Years of Education: N/A   Occupational History  . Not on file.   Social History Main Topics  .  Smoking status: Former Smoker    Types: Cigarettes    Quit date: 11/16/2006  . Smokeless tobacco: Never Used  . Alcohol Use: 0.6 oz/week    1 Glasses of wine per week     Comment: socal drinker  . Drug Use: No  . Sexual Activity: Yes    Birth Control/ Protection: None   Other Topics Concern  . 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 Anesthesia problems Other   . Depression Father     Past Medical History  Diagnosis Date  . Anemia      only with pregnancy  . Chicken pox   . Frequent headaches   . GERD (gastroesophageal reflux disease)   . Seasonal allergies   . UTI (urinary tract infection)   . Seizures (HCC)   . 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 Cholecystectomy  04/2008  . Wisdom tooth extraction  1997    Current Outpatient Prescriptions  Medication Sig Dispense Refill  . B Complex-C (SUPER B COMPLEX PO) Take by mouth.    . esomeprazole (NEXIUM) 20 MG capsule Take 20 mg by mouth daily at 12 noon.    . levETIRAcetam (KEPPRA) 500 MG tablet Take 1 tablet (500 mg total) by mouth 2 (two) times daily. 60 tablet 3  . [DISCONTINUED] pravastatin (PRAVACHOL) 20 MG tablet Take 1 tablet (20 mg total) by mouth daily. (Patient not taking: Reported on 07/30/2015) 90 tablet 1   No current facility-administered medications for this visit.    Allergies as of 02/25/2016 - Review Complete 02/25/2016  Allergen Reaction Noted  . Amoxicillin Other (See Comments) 10/28/2012    Vitals: BP 120/82 mmHg  Pulse 72  Resp 20  Ht  (1.676 m)  Wt 301 lb (136.533 kg)  BMI 48.61 kg/m2 Last Weight:  Wt Readings from Last 1 Encounters:  02/25/16 301 lb (136.533 kg)   ZOX:WRUE mass index is 48.61 kg/(m^2).     Last Height:   Ht Readings from Last 1 Encounters:  02/25/16  (1.676 m)    Physical exam:  General: The patient is awake, alert and appears not in acute distress. The patient is well groomed. Head: Normocephalic, atraumatic. Neck is supple. Mallampati 4,  neck circumference:18.25  Nasal airflow rhinitis, TMJ is evident . Retrognathia is seen. Lateral crowding from tonsils.  Cardiovascular:  Regular rate and rhythm, without  murmurs or carotid bruit, and without distended neck veins. Respiratory: Lungs are clear to auscultation. Skin:  Without evidence of edema, or  rash Trunk: BMI is elavated  Neurologic exam : The patient is awake and alert, oriented to place and time.   Memory subjective  described as intact.  Attention span & concentration ability appears normal.  Speech is fluent,  without  dysarthria, dysphonia or aphasia.  Mood and affect are appropriate.  Cranial nerves: Pupils are equal and briskly reactive to light. Funduscopic exam without evidence of  pallor or edema. Extraocular movements  in vertical and horizontal planes intact and without nystagmus. Visual fields by finger perimetry are intact. Hearing to finger rub intact. Facial sensation intact to fine touch.Facial motor strength is symmetric and tongue and uvula move midline. Shoulder shrug was symmetrical.   Motor exam:  Normal tone, muscle bulk and symmetric strength in all extremities. Sensory:  Fine touch, pinprick and vibration were tested in all extremities. Proprioception tested in the upper extremities was normal. Coordination: Rapid alternating movements in the fingers/hands was normal. Finger-to-nose maneuver  normal without evidence of ataxia, dysmetria or tremor.  Gait and station: Patient walks without assistive device and is able unassisted to climb up to the exam table. Strength within normal limits.  Stance is stable and normal.  Deep tendon reflexes: in the  upper and lower extremities are symmetric and intact. Babinski maneuver response is downgoing.  The patient was advised of the nature of the diagnosed sleep disorder , the treatment options and risks for general a health and wellness arising from not treating the condition.  I spent more than 40 minutes of face to face time with the patient. Greater than 50% of time was spent in counseling and coordination of care. We have discussed the diagnosis and differential and I answered the patient's questions.     Assessment:  After physical and neurologic examination, review of laboratory studies,  Personal review of  imaging studies, reports of other /same  Imaging studies ,  Results of polysomnography/ neurophysiology testing and pre-existing records as far as provided in visit., my assessment is   1) Lindsay Aguilar is here today shortly after she begun treatment with Keppra for a seizure disorder but had manifested twice, each time during sleep. Her primary neurologist is Dr. Pearlean BrownieSethi.  2) the patient has an increased level of fatigue or drowsiness related to Keppra but she has also significant risk factors for obstructive sleep apnea including body mass index, a very small lower jaw, crowded dental status her tonsils were never removed, her neck circumference is 18-1/4 inch. Mallampati grade 4.  3) her husband has witnessed her to snore, he has not witnessed frank apneas. She also likes to sleep prone or sometimes on her side but avoid supine sleep instinctively. This may modify her apnea index. I will order a sleep study split night polysomnography with an expanded EEG montage to have the opportunity to see if nocturnal seizures are evident.     Plan:  Treatment plan and additional workup :  SPLIT with hypoxia measures. Patient's husband in on call every third week.   I like for the patient to arrive for the earlier bed time, allowing better chances to SPLIT. Patient cannot drive! '  Seizure montage.   Obesity ; carb  modification diet   Rv after sleep study.  Lindsay Mylararmen Abrial Arrighi MD  02/25/2016   CC: Terressa KoyanagiHannah R Kim, Do [address] [place], [place] [id number]

## 2016-02-26 DIAGNOSIS — Z0289 Encounter for other administrative examinations: Secondary | ICD-10-CM

## 2016-02-27 ENCOUNTER — Telehealth: Payer: Self-pay

## 2016-02-27 NOTE — Telephone Encounter (Signed)
Rn call patient back about her FMLA forms being sent to [employer]. Forms were fax to  [phone]. Rn explain that the FMLA is only for [facility] office.Pt verbalized understanding.

## 2016-03-11 ENCOUNTER — Telehealth: Payer: Self-pay | Admitting: Nurse Practitioner

## 2016-03-11 NOTE — Telephone Encounter (Signed)
Per Darrol Angelarolyn Martin, NP, this is not a common SE of Keppra. She is to f/u with her PCP about cramps. She states all these sx started when she started the Keppra. She stated she looked online and she saw that generic keppra has caused cramps in other people. I advised she has to make sure she is getting her information from a reputable website (example: .org). She states the cramps are in her legs. I advised her to f/u with PCP. She states she cannot take any more time off work and that she will be fired. I asked pt if she every f/u with her PCP about the leg swelling and she said she did not and cannot go to all these appt because she cannot miss any more work. I highly encouraged she f/u with PCP. She declined and does not want to do this. She says we told her to do this last time and she thinks it is the keppra causing SE.  She requested a call from Eber Jonesarolyn, NP to discuss. I advised I will send message to [place]. Pt verbalized understanding.

## 2016-03-11 NOTE — Telephone Encounter (Signed)
Pt called in with concerns about medication reaction to levETIRAcetam (KEPPRA) 500 MG tablet. She is having cramps. Please call [phone]

## 2016-03-11 NOTE — Telephone Encounter (Signed)
Called patient. Made her aware that I do not think that Keppra is causing her cramping. Her most recent CBC elevated white count. I also made her aware that I asked Dr. Pearlean Browniesethi about this and he said he uses Keppra for cramping. Recommend that she stay well hydrated. Her fluid retention is not coming from the Keppra as well. Once again reiterated she needs to see her primary care and she says I have too much work over these little things.

## 2016-03-30 ENCOUNTER — Telehealth: Payer: Self-pay | Admitting: Nurse Practitioner

## 2016-03-30 NOTE — Telephone Encounter (Signed)
[facility] office visit notes. Please fax : [phone], Phone : [phone] ext [phone]

## 2016-03-31 NOTE — Telephone Encounter (Signed)
Records faxed on 03/31/16.

## 2016-04-14 ENCOUNTER — Telehealth: Payer: Self-pay | Admitting: *Deleted

## 2016-04-14 NOTE — Telephone Encounter (Signed)
Pt called and accidentally took 2000mg  keppra.  (she works from home and will take her medication and place at her desk).  She without thinking took 4 tabs this am.  I consulted CM/NP and she stated maximum dosing for keppra is 3000mg .  Would hold evening dose tonight and then resume 500mg  po bid in am.   She is to call us with problems.  Her daughter is at home, her husband works just down the street.  She verbalized understanding.

## 2016-04-23 ENCOUNTER — Ambulatory Visit: Payer: 59 | Admitting: Neurology

## 2016-04-27 ENCOUNTER — Telehealth: Payer: Self-pay

## 2016-04-27 NOTE — Telephone Encounter (Signed)
Rn call patient to find out if she wanted to keep the appt with Carolyn(NP) on 06/05/2016. Pt stated she will keep the appt with Carolyn(NP) and will be having a split sleep study on Jul 7.

## 2016-04-27 NOTE — Telephone Encounter (Signed)
If patient calls back she already has an appt with Carolyn(NP) on July 21 for her seizure follow up.Tell her Dr.Sethi will no return to the office till July 5. Ask her does she just want to keep that appt for now. LFt vm for patient about appt.

## 2016-05-22 ENCOUNTER — Ambulatory Visit (INDEPENDENT_AMBULATORY_CARE_PROVIDER_SITE_OTHER): Payer: 59 | Admitting: Neurology

## 2016-05-22 DIAGNOSIS — S0340XA Sprain of jaw, unspecified side, initial encounter: Secondary | ICD-10-CM

## 2016-05-22 DIAGNOSIS — R0683 Snoring: Secondary | ICD-10-CM

## 2016-05-22 DIAGNOSIS — K219 Gastro-esophageal reflux disease without esophagitis: Secondary | ICD-10-CM

## 2016-05-22 DIAGNOSIS — G4730 Sleep apnea, unspecified: Secondary | ICD-10-CM | POA: Diagnosis not present

## 2016-05-22 DIAGNOSIS — G4710 Hypersomnia, unspecified: Secondary | ICD-10-CM | POA: Diagnosis not present

## 2016-05-31 ENCOUNTER — Other Ambulatory Visit: Payer: Self-pay | Admitting: Nurse Practitioner

## 2016-06-04 ENCOUNTER — Ambulatory Visit: Payer: 59 | Admitting: Nurse Practitioner

## 2016-06-05 ENCOUNTER — Encounter: Payer: Self-pay | Admitting: Nurse Practitioner

## 2016-06-05 ENCOUNTER — Ambulatory Visit (INDEPENDENT_AMBULATORY_CARE_PROVIDER_SITE_OTHER): Payer: 59 | Admitting: Nurse Practitioner

## 2016-06-05 VITALS — BP 117/73 | HR 102 | Ht 66.0 in | Wt 297.6 lb

## 2016-06-05 DIAGNOSIS — Z5181 Encounter for therapeutic drug level monitoring: Secondary | ICD-10-CM | POA: Diagnosis not present

## 2016-06-05 DIAGNOSIS — R569 Unspecified convulsions: Secondary | ICD-10-CM | POA: Diagnosis not present

## 2016-06-05 DIAGNOSIS — G2581 Restless legs syndrome: Secondary | ICD-10-CM | POA: Diagnosis not present

## 2016-06-05 MED ORDER — LEVETIRACETAM 500 MG PO TABS
ORAL_TABLET | ORAL | Status: DC
Start: 2016-06-05 — End: 2017-03-25

## 2016-06-05 NOTE — Patient Instructions (Signed)
Continue Keppra at current dose will refill Will check CBC, ferritin level and iron studies Sleep study was normal for obstructive sleep apnea however restless legs were seen Follow-up in 6 months

## 2016-06-05 NOTE — Progress Notes (Signed)
[facility]  PATIENT: Lindsay Aguilar DOB: 05/27/1981   REASON FOR VISIT: History of seizure disorder during sleep , wanting  sleep study results HISTORY FROM: Patient    HISTORY OF PRESENT ILLNESS: HISTORY PSMs Lindsay Aguilar is a 35 year old pleasant Caucasian lady who 3 days ago on Monday night had an episode of witnessed seizure in sleep. She is unable to describe the episode and the husband was an eyewitness.. The patient and husband went to bed at the usual time. Her daughter who sleeps with them woke up and shouted.at about 2 AM which woke up the husband who noticed that his wife had tonic posturing of her upper extremities with tremulousness. Her mouth was closed. She had tongue bite. She had incontinence of urine. The patient stopped shaking after 3-5 minutes. She appeared to be postictal with transient disorientation and confusion. She recalls the EMS arriving to the house.and her disorientation started clearing by the time she reached the hospital. She had noncontrast CT scan of the head done on 08/16/2015 which I personally reviewed and is unremarkable except for evidence of incidental sinusitis. Basic metabolic panel labs and CBC were unremarkable. Patient had started a cold medication on that day which include atenolol, guaifenesin and phenylephrine. She has no known prior history of seizures, significant head injury with loss of consciousness, childhood meningitis, neurological illness. She denies drinking or recreational abuse drugs or drinking significant amounts of alcohol. There is no family history of epilepsy either. She feels back to baseline and has no complaints today. She denies sleep deprivation, significant stress, irregular eating and sleeping habits. She does not drink excessive amounts of caffeine Update 10/22/2015 ; PSShe returns for follow-up after initial consultation visit 2 and half months ago. She continues to do well without recurrent seizure  episodes. She had an MRI scan of the brain done on 08/19/15 which I personally reviewed and is normal without any structural abnormalities in the brain. EEG done on 08/22/15 is normal without definite epileptiform activity. Patient states she is not had any further seizure-like episodes or any other neurological complaints. She is keen to start driving again soon UPDATE 04/03/2017CM Ms Lindsay Aguilar, 35 year old female returns for follow-up. She has a history of 2 seizure events since September both occurred while sleeping. Her most recent seizure on 02/12/16 occurred while she was napping in the afternoon with her daughter. She had some generalized shaking of her extremities that lasted approximately 3-4 minutes with loss of consciousness and incontinence of urine. She is markedly obese and may have sleep apnea. After her most recent event she needs anticonvulsant medications. MRI of the brain in the past has been normal. EEG has been normal UPDATE 07/21/2017CM. [name] 35 year old female returns for follow-up. She was last seen in April and had 2 previous seizure events. She was placed on Keppra at her last visit and asked to get a sleep study . 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 She was advised to lose weight and exercise. In addition there were frequent periodic limb movements of sleep with associated sleep disruption. Will check ferritin level and iron studies. She has had a low hemoglobin in the past. She returns for reevaluation. She has not had further seizure events. She was made aware she still cannot drive for 3 months since her last seizure was 02/12/2016.   REVIEW OF SYSTEMS: Full 14 system review of systems performed and notable only for  those listed, all others are neg:  Constitutional: neg  Cardiovascular: neg Ear/Nose/Throat: neg  Skin: neg Eyes: neg Respiratory: neg Gastroitestinal: neg    Hematology/Lymphatic: neg  Endocrine: neg Musculoskeletal:neg Allergy/Immunology: neg Neurological: neg Psychiatric: neg Sleep : neg   ALLERGIES: Allergies  Allergen Reactions  . Amoxicillin Other (See Comments)    Yeast infection    HOME MEDICATIONS: Outpatient Prescriptions Prior to Visit  Medication Sig Dispense Refill  . B Complex-C (SUPER B COMPLEX PO) Take by mouth.    . esomeprazole (NEXIUM) 20 MG capsule Take 20 mg by mouth daily at 12 noon.    . levETIRAcetam (KEPPRA) 500 MG tablet TAKE 1 TABLET(500 MG) BY MOUTH TWICE DAILY 60 tablet 0  . omeprazole (PRILOSEC OTC) 20 MG tablet Take 1 tablet (20 mg total) by mouth daily. 30 tablet 5   No facility-administered medications prior to visit.    PAST MEDICAL HISTORY: Past Medical History  Diagnosis Date  . Anemia     only with pregnancy  . Chicken pox   . Frequent headaches   . GERD (gastroesophageal reflux disease)   . Seasonal allergies   . UTI (urinary tract infection)   . Seizures (HCC)   . 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 Cholecystectomy  04/2008  . Wisdom tooth extraction  1997    FAMILY HISTORY: Family History  Problem Relation Age of Onset  . Anesthesia problems Other   . Depression Father     SOCIAL HISTORY: Social History   Social History  . Marital Status: Married    Spouse Name: N/A  . Number of Children: N/A  . Years of Education: N/A   Occupational History  . Not on file.   Social History Main Topics  . Smoking status: Former Smoker    Types: Cigarettes    Quit date: 11/16/2006  . Smokeless tobacco: Never Used  . Alcohol Use: 0.6 oz/week    1 Glasses of wine per week     Comment: socal drinker  . Drug Use: No  . Sexual Activity: Yes    Birth Control/ Protection: None   Other Topics Concern  . 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  GeGeneral: Obese young Caucasian lady, seated, in no evident distress Head: head normocephalic and atraumatic.  Neck: supple with no carotid or supraclavicular bruits , Cardiovascular: regular rate and rhythm, no murmurs Musculoskeletal: no deformity Skin: no rash/petichiae Vascular: Normal pulses all extremities  Neurologic Exam Mental Status: Awake and fully alert. Oriented to place and time. Recent and remote memory intact. Attention span, concentration and fund of knowledge appropriate. Mood and affect appropriate. Cranial Nerves: Fundoscopic exam not done . Pupils equal, briskly reactive to light. Extraocular movements full without  nystagmus. Visual fields full to confrontation. Hearing intact. Facial sensation intact. Face, tongue, palate moves normally and symmetrically.  Motor: Normal bulk and tone. Normal strength in all tested extremity muscles. Sensory.: intact to touch , pinprick , position and vibratory sensation.  Coordination: Rapid alternating movements normal in all extremities. Finger-to-nose and heel-to-shin performed accurately bilaterally. Gait and Station: Arises from chair without difficulty. Stance is normal. Gait demonstrates normal stride length and balance . Able to heel, toe and tandem walk without difficulty.  Reflexes: 1+ and symmetric. Toes downgoing   DIAGNOSTIC DATA (LABS, IMAGING, TESTING) - I reviewed patient records, labs, notes, testing and imaging myself where  available.  Lab Results  Component Value Date   WBC 14.7* 02/12/2016   HGB 11.8* 02/12/2016   HCT 37.3 02/12/2016   MCV 76.0* 02/12/2016   PLT 334 02/12/2016      Component Value Date/Time   NA 138 02/12/2016 1558   K 4.3 02/12/2016 1558   CL 106 02/12/2016 1558   CO2 22 02/12/2016 1558   GLUCOSE 106* 02/12/2016 1558   BUN 7 02/12/2016 1558   CREATININE 0.72 02/12/2016 1558   CALCIUM 8.9 02/12/2016 1558   PROT 6.4* 07/30/2015 0350   ALBUMIN 3.3* 07/30/2015 0350   AST 19 07/30/2015 0350   ALT 18 07/30/2015 0350   ALKPHOS 57 07/30/2015 0350   BILITOT 0.3 07/30/2015 0350   GFRNONAA >60 02/12/2016 1558   GFRAA >60 02/12/2016 1558   Lab Results  Component Value Date   CHOL 217* 01/08/2016   HDL 31* 01/08/2016   LDLCALC 158 01/08/2016   LDLDIRECT 160.6 01/04/2014   TRIG 138 01/08/2016   CHOLHDL 6 09/14/2014   Lab Results  Component Value Date   HGBA1C 5.6 01/08/2016    ASSESSMENT AND PLAN 34 y.o. year old female has a past medical history of 2 separate seizure events in the past 6 months both while sleeping. She is morbidly obese. With her most recent seizure she had generalized jerking of the extremities loss of consciousness and incontinence of urine. MRI of the brain in the past has been normal EEG has been normal most recent event occurred on 02/12/2016. 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 She was advised to lose weight and exercise. In addition there were frequent periodic limb movements of sleep with associated sleep disruption. Will check ferritin level and iron studies.  Cntinue Keppra at current dose will refill Will check CBC, ferritin level and iron studies Sleep study was normal for obstructive sleep apnea however restless legs were seen, results were reviewed with patient. No driving for 3 more months may resume 08/14/2016 Follow-up  in 6 monthsVisit time 30 min Nilda Riggs, [facility], [facility], APRN  [facility] [address] [place], [place] [address] [phone]

## 2016-06-05 NOTE — Progress Notes (Signed)
I agree with the above plan 

## 2016-06-06 LAB — IRON AND TIBC
IRON SATURATION: 14 % — AB (ref 15–55)
Iron: 41 ug/dL (ref 27–159)
TIBC: 292 ug/dL (ref 250–450)
UIBC: 251 ug/dL (ref 131–425)

## 2016-06-06 LAB — CBC WITH DIFFERENTIAL/PLATELET
BASOS: 0 %
Basophils Absolute: 0 10*3/uL (ref 0.0–0.2)
EOS (ABSOLUTE): 0.1 10*3/uL (ref 0.0–0.4)
EOS: 1 %
HEMATOCRIT: 36 % (ref 34.0–46.6)
HEMOGLOBIN: 11.6 g/dL (ref 11.1–15.9)
IMMATURE GRANULOCYTES: 0 %
Immature Grans (Abs): 0 10*3/uL (ref 0.0–0.1)
LYMPHS ABS: 2.6 10*3/uL (ref 0.7–3.1)
Lymphs: 26 %
MCH: 24.1 pg — ABNORMAL LOW (ref 26.6–33.0)
MCHC: 32.2 g/dL (ref 31.5–35.7)
MCV: 75 fL — AB (ref 79–97)
MONOCYTES: 5 %
MONOS ABS: 0.5 10*3/uL (ref 0.1–0.9)
Neutrophils Absolute: 6.8 10*3/uL (ref 1.4–7.0)
Neutrophils: 68 %
Platelets: 313 10*3/uL (ref 150–379)
RBC: 4.81 x10E6/uL (ref 3.77–5.28)
RDW: 15.1 % (ref 12.3–15.4)
WBC: 10 10*3/uL (ref 3.4–10.8)

## 2016-06-06 LAB — FERRITIN: Ferritin: 74 ng/mL (ref 15–150)

## 2016-06-08 ENCOUNTER — Telehealth: Payer: Self-pay | Admitting: *Deleted

## 2016-06-08 NOTE — Telephone Encounter (Signed)
-----   Message from Nilda Riggs, NP sent at 06/08/2016 11:22 AM EDT ----- Labs look good please call the patient

## 2016-06-08 NOTE — Telephone Encounter (Signed)
I spoke to pt. Eber Jones, NP discussed sleep study results with pt at her appt. I offered pt a follow up with Dr. Vickey Huger to discuss restless legs but pt declined, saying that she would just continue to follow up with Eber Jones, NP.

## 2016-06-08 NOTE — Telephone Encounter (Signed)
Spoke to pt and relayed the lab results (looked good).  No other recommendations.   She stated that she has used DIAL soap under her bed sheets under her legs at night and has noted an improvement of the periodic leg movements.  I would ask if they had heard of this?

## 2016-06-15 ENCOUNTER — Telehealth: Payer: Self-pay | Admitting: Nurse Practitioner

## 2016-06-15 NOTE — Telephone Encounter (Signed)
Patient is calling and states she has been diagnoised with restless leg. She states she had not thought about it before but she has been haveing cramping in her feet and recently non-stop.  Please call.

## 2016-06-16 NOTE — Telephone Encounter (Signed)
I spoke to pt. Lindsay Jones, NP discussed pt's sleep study results with pt and the appt on 06/05/2016 and discussed with her the PLMS that disrupted her sleep. Pt declined treatment for RLS and also declined a follow up appt with Dr. Vickey Aguilar.  However, pt called back today asking if her feet cramping is restless legs? She advises me that only her feet cramp, not her legs. Since Thursday, she has been woken up in her sleep with her feet cramping. I advised her that I do not think this is RLS, but I will see what Dr. Vickey Aguilar thinks. I did advise pt that if she wanted to discuss treatment for RLS/PLMS, that she would need to make an appt with Dr. Vickey Aguilar. Pt verbalized understanding.

## 2016-06-17 MED ORDER — ROPINIROLE HCL 0.25 MG PO TABS: 0.2500 mg | ORAL_TABLET | Freq: Every day | ORAL | 0 refills | Status: DC

## 2016-06-17 NOTE — Telephone Encounter (Signed)
She can try Requip at HS. If this doesn't work need to see PCP. Please call her and refill. I added to med list Thanks

## 2016-06-17 NOTE — Telephone Encounter (Signed)
PLMs can be part of RLS, but some people have either or.  Lindsay Aguilar or PCP can try some anti spasticity meds with her.

## 2016-06-17 NOTE — Telephone Encounter (Signed)
I spoke to pt and advised her that Eber Jones, NP recommends requip at bedtime for her feet cramping. Pt asked that the medication be called in to [facility]. Potential medication side effects were discussed with the patient. Pt verbalized understanding.

## 2016-06-17 NOTE — Telephone Encounter (Signed)
I spoke to Lindsay Aguilar. I advised her that Dr. Vickey Huger says that PLMS can be a part of RLS but some people have either or. Dr. Vickey Huger recommended that Lindsay Aguilar's PCP try anti-spasticity meds for the feet cramping. I advised her that if this doesn't work, to please call us back and we can. Lindsay Aguilar states "I really like Lindsay Aguilar, can you ask her? I don't want to call different offices for one problem." I advised her that I would send this message to Lindsay Jones, NP to see what she could offer the Lindsay Aguilar for "foot cramping". Lindsay Aguilar verbalized understanding.

## 2016-07-05 ENCOUNTER — Other Ambulatory Visit: Payer: Self-pay | Admitting: Nurse Practitioner

## 2016-08-03 ENCOUNTER — Other Ambulatory Visit: Payer: Self-pay | Admitting: Neurology

## 2016-08-03 DIAGNOSIS — R0683 Snoring: Secondary | ICD-10-CM

## 2016-08-03 DIAGNOSIS — K219 Gastro-esophageal reflux disease without esophagitis: Secondary | ICD-10-CM

## 2016-08-17 ENCOUNTER — Other Ambulatory Visit: Payer: Self-pay | Admitting: Neurology

## 2016-10-14 IMAGING — CR DG CHEST 2V
2 series · 2 of 2 positions shown · non-contrast
Comparison: 02/15/2014

CLINICAL DATA: New onset seizure tonight.

EXAM:
CHEST  2 VIEW

[chest pa]
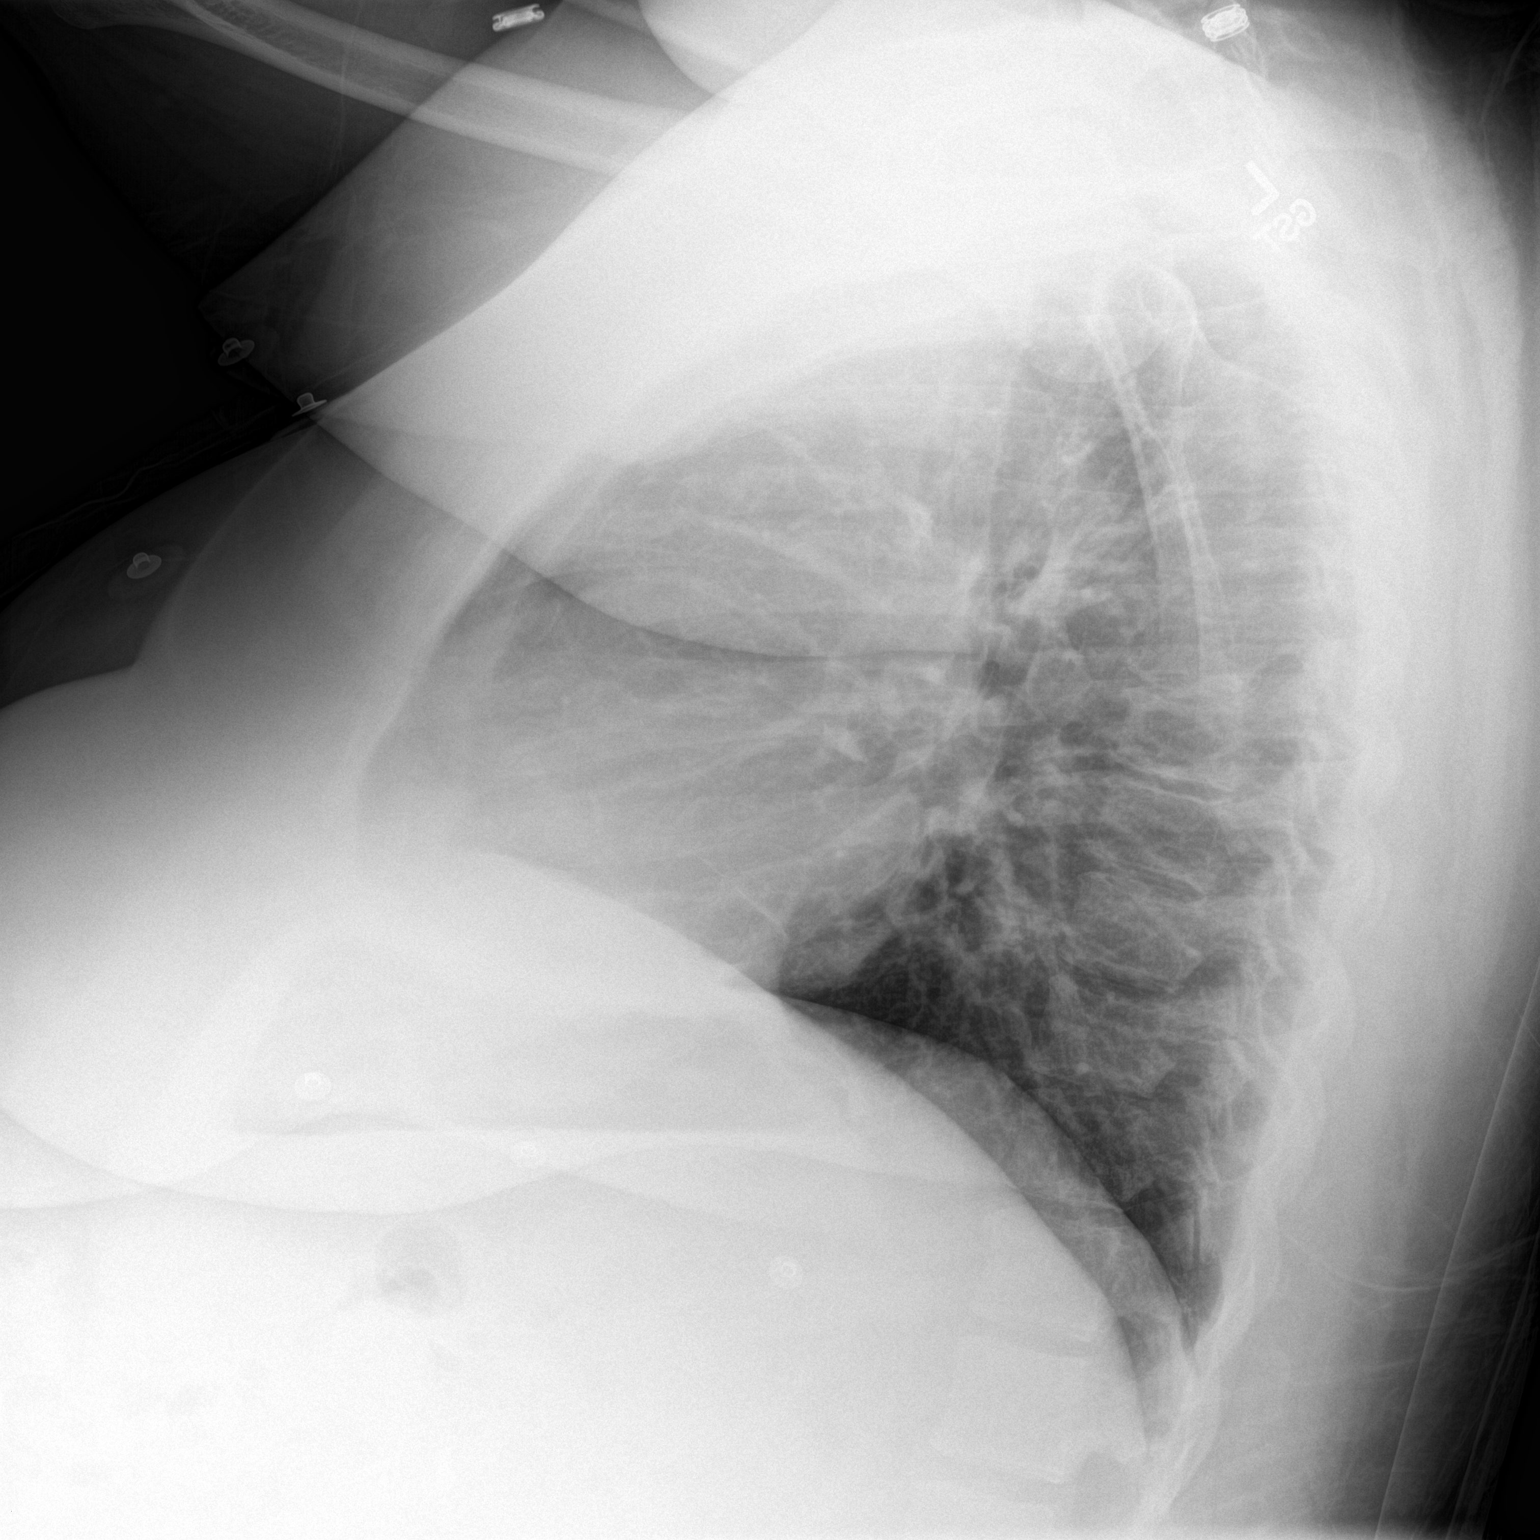

[chest ap]
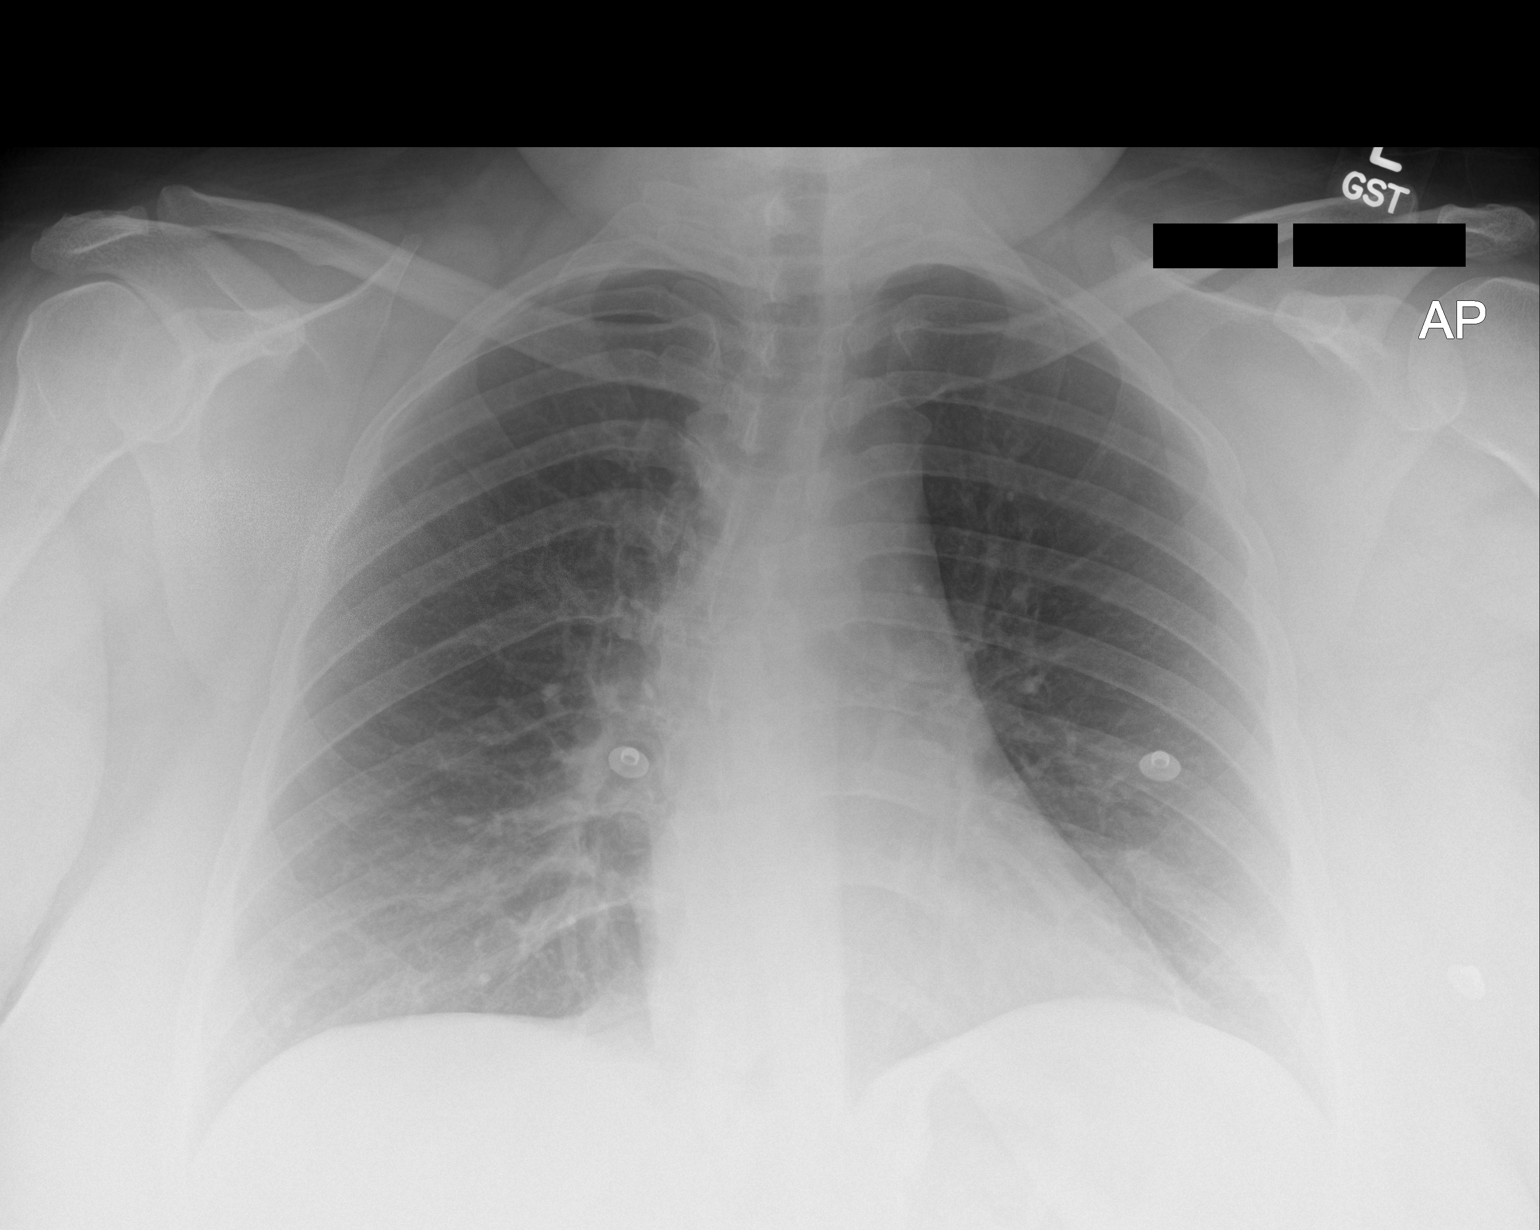

[2 of 2 positions shown; findings below may reference images not displayed]

FINDINGS: The cardiomediastinal contours are normal. The lungs are clear.
Pulmonary vasculature is normal. No consolidation, pleural effusion,
or pneumothorax. No acute osseous abnormalities are seen.
IMPRESSION: No acute pulmonary process.

## 2016-10-14 IMAGING — CT CT HEAD W/O CM
1 series · 16 of 30 positions shown, 20 images · non-contrast
Comparison: None.

CLINICAL DATA: New onset seizure.

EXAM:
CT HEAD WITHOUT CONTRAST
TECHNIQUE: Contiguous axial images were obtained from the base of the skull
through the vertex without intravenous contrast.

[Series 2: head 5.0 h30s · axial · 0.49mm/px · z∈[-144,-9]mm · 16 of 30 slices shown, 20 images]
[im 2/30  brain]
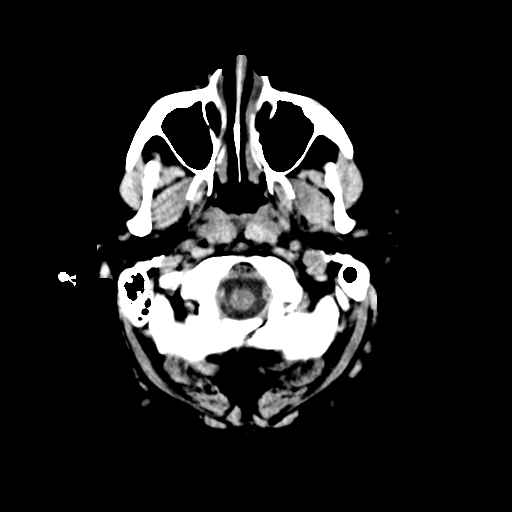
[im 2/30  bone]
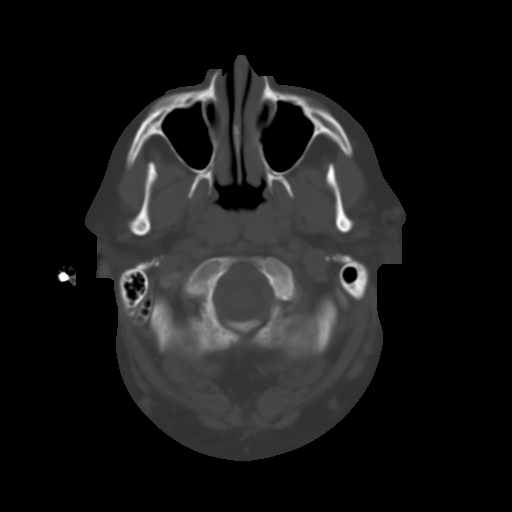
[im 4/30  brain]
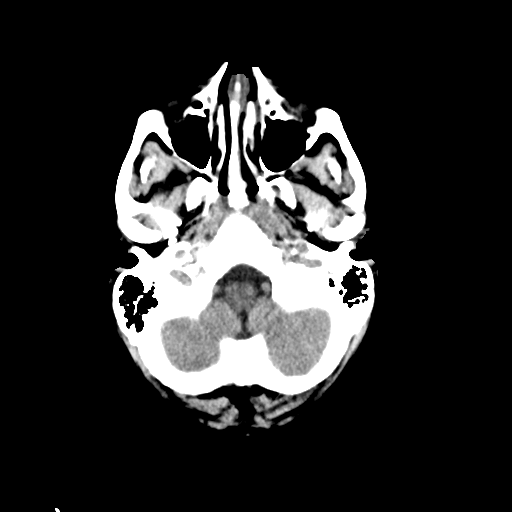
[im 6/30  brain]
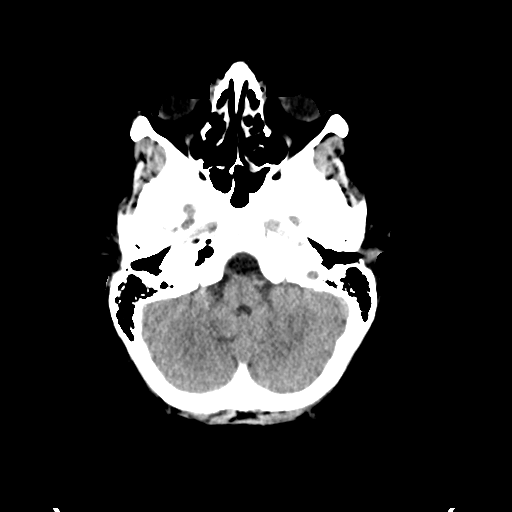
[im 8/30  brain]
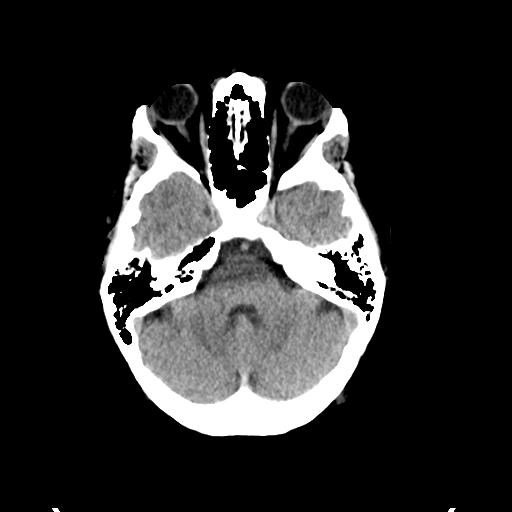
[im 9/30  brain]
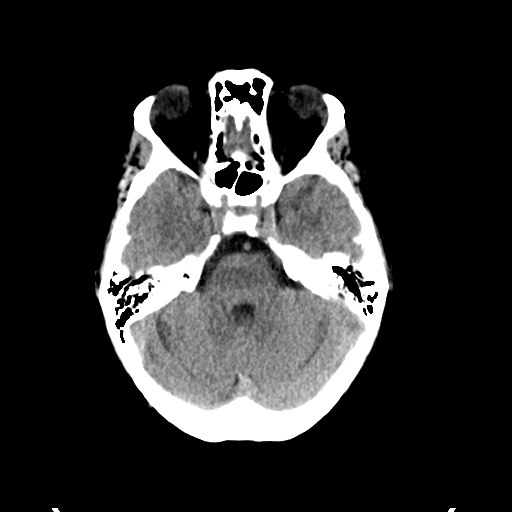
[im 9/30  bone]
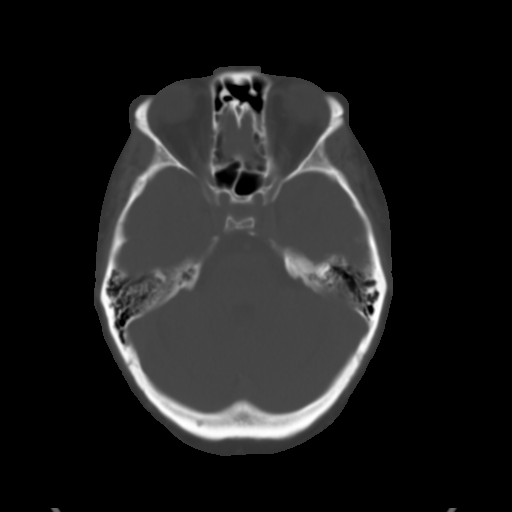
[im 11/30  brain]
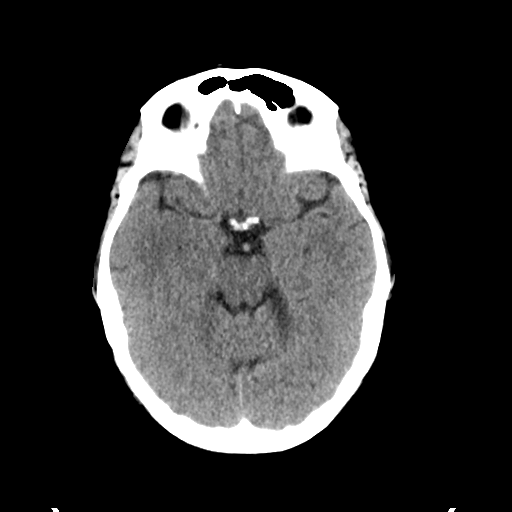
[im 13/30  brain]
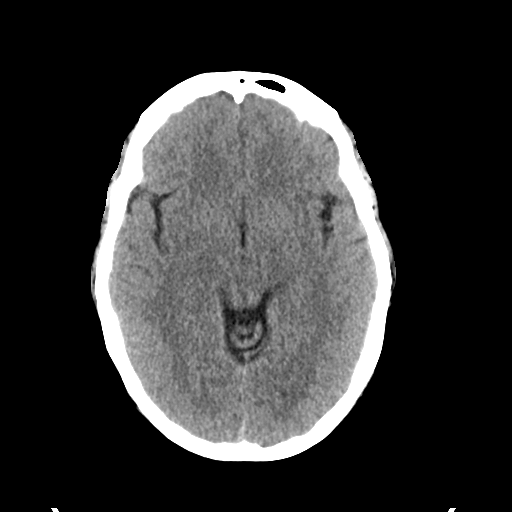
[im 15/30  brain]
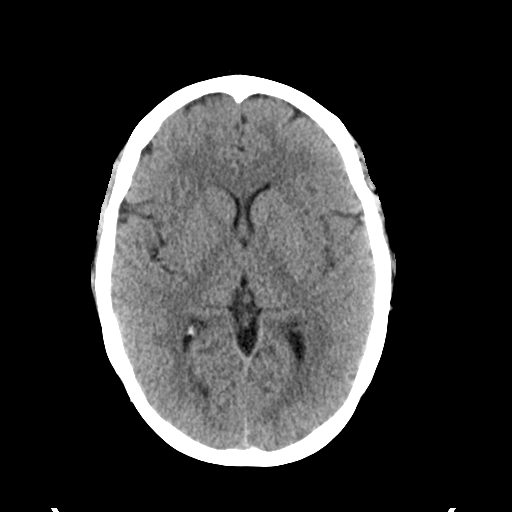
[im 16/30  brain]
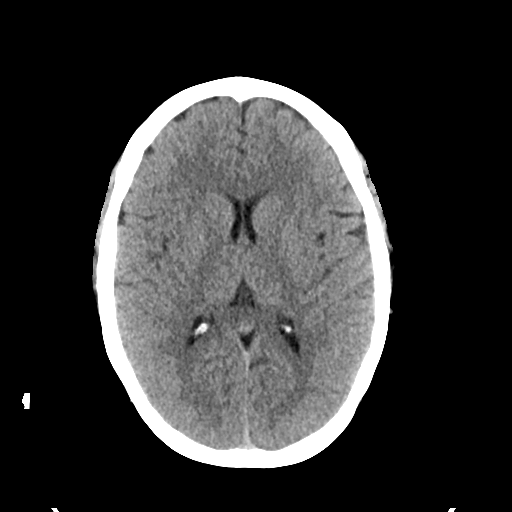
[im 16/30  bone]
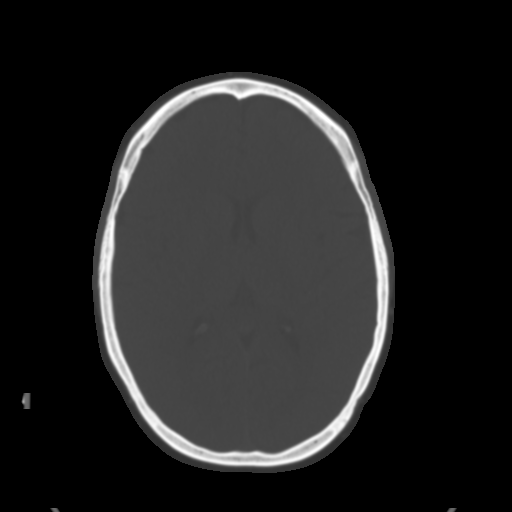
[im 18/30  brain]
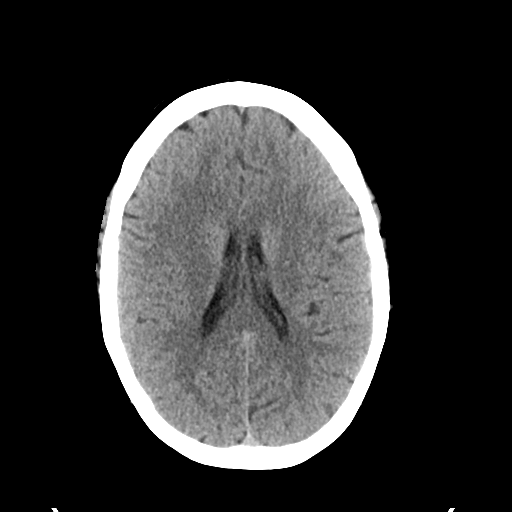
[im 20/30  brain]
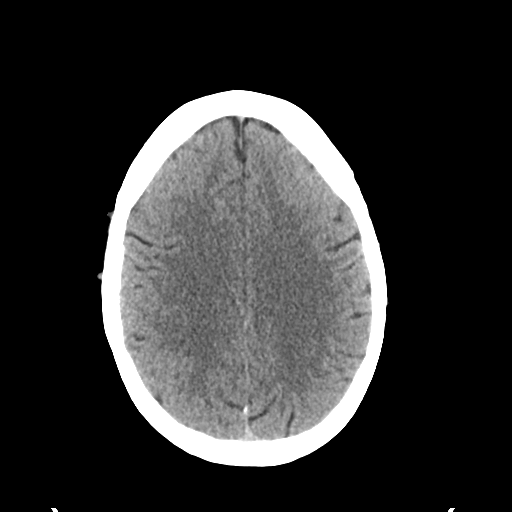
[im 22/30  brain]
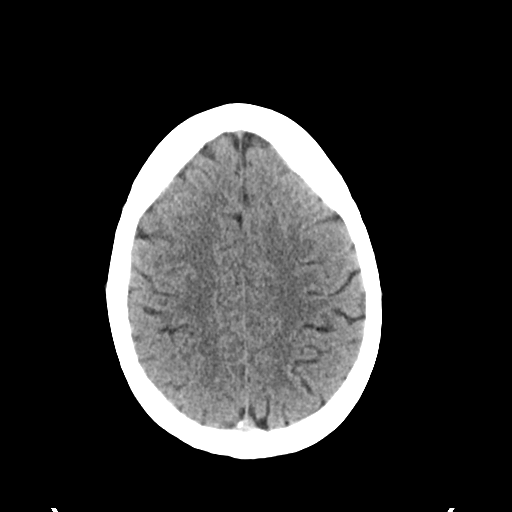
[im 23/30  brain]
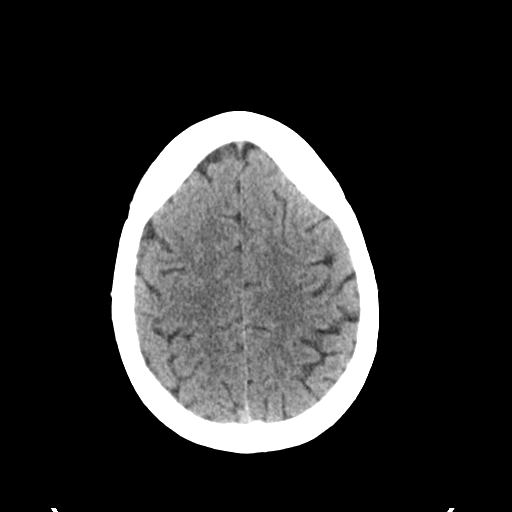
[im 23/30  bone]
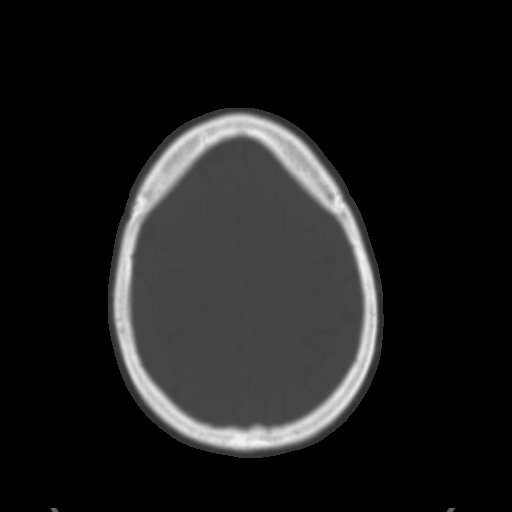
[im 25/30  brain]
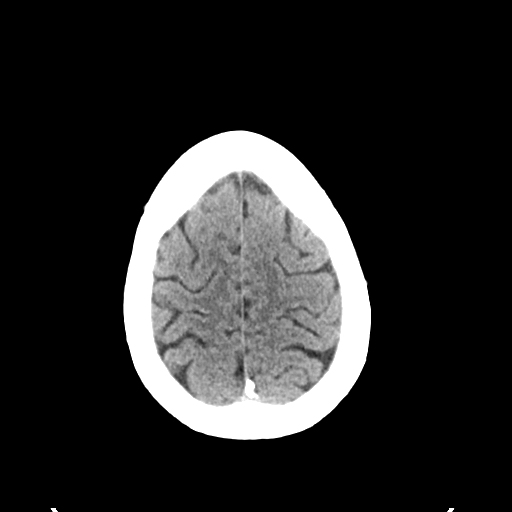
[im 27/30  brain]
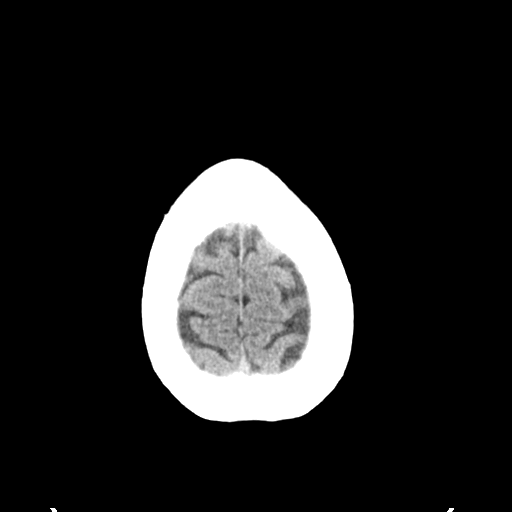
[im 29/30  brain]
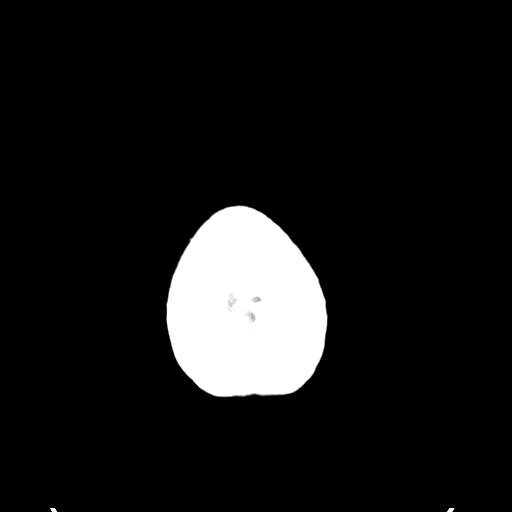

[16 of 30 positions shown; findings below may reference images not displayed]

FINDINGS: No intracranial hemorrhage, mass effect, or midline shift. No
hydrocephalus. The basilar cisterns are patent. No evidence of
territorial infarct. No intracranial fluid collection. Calvarium is
intact. Mild mucosal thickening of the ethmoid air cells and left
frontal sinus. Remaining paranasal sinuses are well-aerated. Mastoid
air cells are clear.
IMPRESSION: 1.  No acute intracranial abnormality.
2. Mild paranasal signs inflammatory change.

## 2016-11-12 ENCOUNTER — Encounter: Payer: Self-pay | Admitting: Nurse Practitioner

## 2016-12-11 ENCOUNTER — Ambulatory Visit: Payer: 59 | Admitting: Nurse Practitioner

## 2016-12-17 ENCOUNTER — Other Ambulatory Visit: Payer: Self-pay | Admitting: Obstetrics and Gynecology

## 2016-12-17 DIAGNOSIS — N644 Mastodynia: Secondary | ICD-10-CM

## 2016-12-21 ENCOUNTER — Ambulatory Visit
Admission: RE | Admit: 2016-12-21 | Discharge: 2016-12-21 | Disposition: A | Discharge status: Home / Self Care | Payer: 59 | Source: Ambulatory Visit | Attending: Obstetrics and Gynecology | Admitting: Obstetrics and Gynecology

## 2016-12-21 ENCOUNTER — Other Ambulatory Visit: Payer: 59

## 2016-12-21 DIAGNOSIS — N644 Mastodynia: Secondary | ICD-10-CM

## 2017-02-04 ENCOUNTER — Other Ambulatory Visit: Payer: Self-pay | Admitting: Obstetrics and Gynecology

## 2017-02-06 LAB — CYTOLOGY - PAP

## 2017-03-01 ENCOUNTER — Encounter (HOSPITAL_BASED_OUTPATIENT_CLINIC_OR_DEPARTMENT_OTHER): Payer: Self-pay | Admitting: *Deleted

## 2017-03-01 NOTE — Progress Notes (Signed)
NPO AFTER MN.  ARRIVE AT 1100.  GETTING LAB WORK DONE Tuesday [phone] RO Wednesday 03-03-2017 (CBC, CMET).  WILL TAKE KEPPRA AM DOS W/ SIPS OF WATER .   CALLED AND LM FOR HEATHER,  ?T&S IF NEEDED.

## 2017-03-03 DIAGNOSIS — I509 Heart failure, unspecified: Secondary | ICD-10-CM | POA: Diagnosis not present

## 2017-03-03 DIAGNOSIS — N920 Excessive and frequent menstruation with regular cycle: Secondary | ICD-10-CM | POA: Diagnosis not present

## 2017-03-03 DIAGNOSIS — K219 Gastro-esophageal reflux disease without esophagitis: Secondary | ICD-10-CM | POA: Diagnosis not present

## 2017-03-03 DIAGNOSIS — Z87891 Personal history of nicotine dependence: Secondary | ICD-10-CM | POA: Diagnosis not present

## 2017-03-03 DIAGNOSIS — D509 Iron deficiency anemia, unspecified: Secondary | ICD-10-CM | POA: Diagnosis not present

## 2017-03-03 DIAGNOSIS — Z880 Allergy status to penicillin: Secondary | ICD-10-CM | POA: Diagnosis not present

## 2017-03-03 DIAGNOSIS — Z888 Allergy status to other drugs, medicaments and biological substances status: Secondary | ICD-10-CM | POA: Diagnosis not present

## 2017-03-03 DIAGNOSIS — G4089 Other seizures: Secondary | ICD-10-CM | POA: Diagnosis not present

## 2017-03-03 DIAGNOSIS — G2581 Restless legs syndrome: Secondary | ICD-10-CM | POA: Diagnosis not present

## 2017-03-03 LAB — COMPREHENSIVE METABOLIC PANEL
ALT: 17 U/L (ref 14–54)
AST: 16 U/L (ref 15–41)
Albumin: 3.8 g/dL (ref 3.5–5.0)
Alkaline Phosphatase: 52 U/L (ref 38–126)
Anion gap: 8 (ref 5–15)
BUN: 15 mg/dL (ref 6–20)
CALCIUM: 9 mg/dL (ref 8.9–10.3)
CO2: 26 mmol/L (ref 22–32)
CREATININE: 0.74 mg/dL (ref 0.44–1.00)
Chloride: 104 mmol/L (ref 101–111)
GFR calc Af Amer: >60 mL/min (ref >60)
Glucose, Bld: 133 mg/dL — ABNORMAL HIGH (ref 65–99)
POTASSIUM: 4.2 mmol/L (ref 3.5–5.1)
Sodium: 138 mmol/L (ref 135–145)
TOTAL PROTEIN: 7.6 g/dL (ref 6.5–8.1)
Total Bilirubin: 0.4 mg/dL (ref 0.3–1.2)

## 2017-03-03 LAB — CBC
HEMATOCRIT: 34.2 % — AB (ref 36.0–46.0)
Hemoglobin: 10.6 g/dL — ABNORMAL LOW (ref 12.0–15.0)
MCH: 22.6 pg — ABNORMAL LOW (ref 26.0–34.0)
MCHC: 31 g/dL (ref 30.0–36.0)
MCV: 73.1 fL — AB (ref 78.0–100.0)
Platelets: 314 10*3/uL (ref 150–400)
RBC: 4.68 MIL/uL (ref 3.87–5.11)
RDW: 15.8 % — AB (ref 11.5–15.5)
WBC: 11.6 10*3/uL — AB (ref 4.0–10.5)

## 2017-03-03 NOTE — H&P (Signed)
Lindsay Aguilar is an 36 y.o. female S/P BTL with heavy menses. U/S in office shows uterus 10 x 4.9 x 6.2 cm with no adnexal masses.  Pertinent Gynecological History: Menses: flow is excessive with use of many pads or tampons on heaviest days Bleeding: dysfunctional uterine bleeding Contraception: tubal ligation DES exposure: denies Blood transfusions: none Sexually transmitted diseases: no past history Previous GYN Procedures: BTL  Last mammogram: N/A Date: N/A Last pap: normal Date: 2018 OB History: G3, P3   Menstrual History: Menarche age: unknown Patient's last menstrual period was 02/12/2017 (exact date).    Past Medical History:  Diagnosis Date  . GERD (gastroesophageal reflux disease)   . History of CHF (congestive heart failure)    2006 during first preg. dx chf--  evaluated by cardologist (dr Melburn Popper)-- resolved after birth (no issue w/ other preg's)  . Iron deficiency anemia   . Menometrorrhagia   . Nocturnal seizures [facility]) neurologist-  dr sethi/ dr Anne Hahn [facility])   first dx 09/ 2016-- currently taking keppra---  pt feels caused by Mirena IUD that had been just placed since then removed 03/ 2017 after last seizure  . RLS (restless legs syndrome)   . Seasonal allergies   . Wears glasses     Past Surgical History:  Procedure Laterality Date  . LAPAROSCOPIC CHOLECYSTECTOMY  04/2008  . LAPAROSCOPIC TUBAL LIGATION Bilateral 02/2016  . TRANSTHORACIC ECHOCARDIOGRAM  03/09/2014   ef 55-60%/  trivial MR and TR  . WISDOM TOOTH EXTRACTION  age 10    Family History  Problem Relation Age of Onset  . Anesthesia problems Other   . Depression Father     Social History:  reports that she quit smoking about 10 years ago. Her smoking use included Cigarettes. She quit after 10.00 years of use. She has never used smokeless tobacco. She reports that she drinks about 0.6 oz of alcohol per week . She reports that she does not use drugs.  Allergies:  Allergies   Allergen Reactions  . Amoxicillin Other (See Comments)    Yeast infection  . Ephedrine Other (See Comments)    Avoids due to possible cause of seizure    No prescriptions prior to admission.    ROS  Height  (1.676 m), weight 280 lb (127 kg), last menstrual period 02/12/2017. Physical Exam  Results for orders placed or performed during the hospital encounter of 03/04/17 (from the past 24 hour(s))  CBC     Status: Abnormal   Collection Time: 03/03/17 11:59 AM  Result Value Ref Range   WBC 11.6 (H) 4.0 - 10.5 K/uL   RBC 4.68 3.87 - 5.11 MIL/uL   Hemoglobin 10.6 (L) 12.0 - 15.0 g/dL   HCT 16.1 (L) 09.6 - 04.5 %   MCV 73.1 (L) 78.0 - 100.0 fL   MCH 22.6 (L) 26.0 - 34.0 pg   MCHC 31.0 30.0 - 36.0 g/dL   RDW 40.9 (H) 81.1 - 91.4 %   Platelets 314 150 - 400 K/uL  Comprehensive metabolic panel     Status: Abnormal   Collection Time: 03/03/17 11:59 AM  Result Value Ref Range   Sodium 138 135 - 145 mmol/L   Potassium 4.2 3.5 - 5.1 mmol/L   Chloride 104 101 - 111 mmol/L   CO2 26 22 - 32 mmol/L   Glucose, Bld 133 (H) 65 - 99 mg/dL   BUN 15 6 - 20 mg/dL   Creatinine, Ser 7.82 0.44 - 1.00 mg/dL  Calcium 9.0 8.9 - 10.3 mg/dL   Total Protein 7.6 6.5 - 8.1 g/dL   Albumin 3.8 3.5 - 5.0 g/dL   AST 16 15 - 41 U/L   ALT 17 14 - 54 U/L   Alkaline Phosphatase 52 38 - 126 U/L   Total Bilirubin 0.4 0.3 - 1.2 mg/dL   GFR calc non Af Amer >60 >60 mL/min   GFR calc Af Amer >60 >60 mL/min   Anion gap 8 5 - 15    No results found.  Assessment/Plan: 36 yo G3P3 with menorrhagia D/W patient H/S, D&C, EMA-risks reviewed including infection, organ damage, bleeding/transfusion-HIV/Hep, DVT/PE, pneumonia, persistent or recurrent heavy menses, pelvic pain or painful intercourse. Patient states she understands and agrees.  Giovoni Bunch II,Izel Hochberg E 03/03/2017, 5:40 PM

## 2017-03-04 ENCOUNTER — Ambulatory Visit (HOSPITAL_BASED_OUTPATIENT_CLINIC_OR_DEPARTMENT_OTHER): Payer: 59 | Admitting: Anesthesiology

## 2017-03-04 ENCOUNTER — Encounter (HOSPITAL_BASED_OUTPATIENT_CLINIC_OR_DEPARTMENT_OTHER): Payer: Self-pay

## 2017-03-04 ENCOUNTER — Encounter (HOSPITAL_BASED_OUTPATIENT_CLINIC_OR_DEPARTMENT_OTHER)
Admission: RE | Disposition: A | Discharge status: Home / Self Care | Payer: Self-pay | Source: Ambulatory Visit | Attending: Obstetrics and Gynecology

## 2017-03-04 ENCOUNTER — Ambulatory Visit (HOSPITAL_BASED_OUTPATIENT_CLINIC_OR_DEPARTMENT_OTHER)
Admission: RE | Admit: 2017-03-04 | Discharge: 2017-03-04 | Disposition: A | Discharge status: Home / Self Care | Payer: 59 | Source: Ambulatory Visit | Attending: Obstetrics and Gynecology | Admitting: Obstetrics and Gynecology

## 2017-03-04 DIAGNOSIS — G2581 Restless legs syndrome: Secondary | ICD-10-CM | POA: Insufficient documentation

## 2017-03-04 DIAGNOSIS — D509 Iron deficiency anemia, unspecified: Secondary | ICD-10-CM | POA: Insufficient documentation

## 2017-03-04 DIAGNOSIS — N920 Excessive and frequent menstruation with regular cycle: Secondary | ICD-10-CM | POA: Insufficient documentation

## 2017-03-04 DIAGNOSIS — Z888 Allergy status to other drugs, medicaments and biological substances status: Secondary | ICD-10-CM | POA: Insufficient documentation

## 2017-03-04 DIAGNOSIS — Z87891 Personal history of nicotine dependence: Secondary | ICD-10-CM | POA: Insufficient documentation

## 2017-03-04 DIAGNOSIS — K219 Gastro-esophageal reflux disease without esophagitis: Secondary | ICD-10-CM | POA: Insufficient documentation

## 2017-03-04 DIAGNOSIS — E669 Obesity, unspecified: Secondary | ICD-10-CM

## 2017-03-04 DIAGNOSIS — Z880 Allergy status to penicillin: Secondary | ICD-10-CM | POA: Insufficient documentation

## 2017-03-04 DIAGNOSIS — I509 Heart failure, unspecified: Secondary | ICD-10-CM | POA: Insufficient documentation

## 2017-03-04 DIAGNOSIS — G4089 Other seizures: Secondary | ICD-10-CM | POA: Insufficient documentation

## 2017-03-04 HISTORY — DX: Restless legs syndrome: G25.81

## 2017-03-04 HISTORY — DX: Iron deficiency anemia, unspecified: D50.9

## 2017-03-04 HISTORY — PX: DILITATION & CURRETTAGE/HYSTROSCOPY WITH NOVASURE ABLATION: SHX5568

## 2017-03-04 HISTORY — DX: Excessive and frequent menstruation with irregular cycle: N92.1

## 2017-03-04 HISTORY — DX: Epilepsy, unspecified, not intractable, without status epilepticus: G40.909

## 2017-03-04 HISTORY — DX: Presence of spectacles and contact lenses: Z97.3

## 2017-03-04 HISTORY — DX: Unspecified convulsions: R56.9

## 2017-03-04 HISTORY — DX: Personal history of other diseases of the circulatory system: Z86.79

## 2017-03-04 SURGERY — DILATATION & CURETTAGE/HYSTEROSCOPY WITH NOVASURE ABLATION
Anesthesia: General

## 2017-03-04 MED ORDER — MIDAZOLAM HCL 2 MG/2ML IJ SOLN
INTRAMUSCULAR | Status: AC
  Filled 2017-03-04: qty 2

## 2017-03-04 MED ORDER — ONDANSETRON HCL 4 MG/2ML IJ SOLN
INTRAMUSCULAR | Status: DC | PRN
  Administered 2017-03-04: 4 mg via INTRAVENOUS

## 2017-03-04 MED ORDER — CLINDAMYCIN PHOSPHATE 900 MG/50ML IV SOLN
INTRAVENOUS | Status: DC | PRN
  Administered 2017-03-04: 900 mg via INTRAVENOUS

## 2017-03-04 MED ORDER — DEXAMETHASONE SODIUM PHOSPHATE 10 MG/ML IJ SOLN
INTRAMUSCULAR | Status: AC
  Filled 2017-03-04: qty 1

## 2017-03-04 MED ORDER — LACTATED RINGERS IV SOLN
INTRAVENOUS | Status: DC
  Administered 2017-03-04: 13:00:00 via INTRAVENOUS
  Filled 2017-03-04: qty 1000

## 2017-03-04 MED ORDER — LACTATED RINGERS IV SOLN
INTRAVENOUS | Status: DC
  Filled 2017-03-04: qty 1000

## 2017-03-04 MED ORDER — OXYCODONE HCL 5 MG/5ML PO SOLN
5.0000 mg | Freq: Once | ORAL | Status: AC | PRN
  Filled 2017-03-04: qty 5

## 2017-03-04 MED ORDER — OXYCODONE HCL 5 MG PO TABS
5.0000 mg | ORAL_TABLET | Freq: Once | ORAL | Status: AC | PRN
  Administered 2017-03-04: 5 mg via ORAL
  Filled 2017-03-04: qty 1

## 2017-03-04 MED ORDER — CLINDAMYCIN PHOSPHATE 900 MG/6ML IJ SOLN
INTRAMUSCULAR | Status: DC
Start: 2017-03-04 — End: 2017-03-04
  Filled 2017-03-04: qty 11

## 2017-03-04 MED ORDER — FENTANYL CITRATE (PF) 100 MCG/2ML IJ SOLN
INTRAMUSCULAR | Status: DC | PRN
  Administered 2017-03-04 (×8): 25 ug via INTRAVENOUS

## 2017-03-04 MED ORDER — FENTANYL CITRATE (PF) 100 MCG/2ML IJ SOLN
INTRAMUSCULAR | Status: AC
  Filled 2017-03-04: qty 2

## 2017-03-04 MED ORDER — MEPERIDINE HCL 25 MG/ML IJ SOLN
6.2500 mg | INTRAMUSCULAR | Status: DC | PRN
  Filled 2017-03-04: qty 1

## 2017-03-04 MED ORDER — MIDAZOLAM HCL 5 MG/5ML IJ SOLN
INTRAMUSCULAR | Status: DC | PRN
  Administered 2017-03-04: 2 mg via INTRAVENOUS

## 2017-03-04 MED ORDER — PROPOFOL 10 MG/ML IV BOLUS
INTRAVENOUS | Status: DC | PRN
  Administered 2017-03-04: 200 mg via INTRAVENOUS

## 2017-03-04 MED ORDER — KETOROLAC TROMETHAMINE 30 MG/ML IJ SOLN
INTRAMUSCULAR | Status: DC | PRN
  Administered 2017-03-04: 30 mg via INTRAVENOUS

## 2017-03-04 MED ORDER — ONDANSETRON HCL 4 MG/2ML IJ SOLN
INTRAMUSCULAR | Status: AC
  Filled 2017-03-04: qty 2

## 2017-03-04 MED ORDER — DEXAMETHASONE SODIUM PHOSPHATE 4 MG/ML IJ SOLN
INTRAMUSCULAR | Status: DC | PRN
  Administered 2017-03-04: 10 mg via INTRAVENOUS

## 2017-03-04 MED ORDER — SOD CITRATE-CITRIC ACID 500-334 MG/5ML PO SOLN
30.0000 mL | ORAL | Status: DC
  Filled 2017-03-04: qty 30

## 2017-03-04 MED ORDER — PROPOFOL 10 MG/ML IV BOLUS
INTRAVENOUS | Status: AC
  Filled 2017-03-04: qty 20

## 2017-03-04 MED ORDER — METOCLOPRAMIDE HCL 5 MG/ML IJ SOLN
INTRAMUSCULAR | Status: DC | PRN
  Administered 2017-03-04: 10 mg via INTRAVENOUS

## 2017-03-04 MED ORDER — LIDOCAINE HCL (CARDIAC) 20 MG/ML IV SOLN
INTRAVENOUS | Status: DC | PRN
  Administered 2017-03-04: 100 mg via INTRAVENOUS

## 2017-03-04 MED ORDER — PROMETHAZINE HCL 25 MG/ML IJ SOLN
6.2500 mg | INTRAMUSCULAR | Status: DC | PRN
  Filled 2017-03-04: qty 1

## 2017-03-04 MED ORDER — OXYCODONE HCL 5 MG PO TABS
ORAL_TABLET | ORAL | Status: AC
  Filled 2017-03-04: qty 1

## 2017-03-04 MED ORDER — LIDOCAINE 2% (20 MG/ML) 5 ML SYRINGE
INTRAMUSCULAR | Status: AC
  Filled 2017-03-04: qty 5

## 2017-03-04 MED ORDER — LACTATED RINGERS IV SOLN
INTRAVENOUS | Status: DC
  Administered 2017-03-04: 12:00:00 via INTRAVENOUS
  Filled 2017-03-04: qty 1000

## 2017-03-04 MED ORDER — SUCCINYLCHOLINE CHLORIDE 200 MG/10ML IV SOSY
PREFILLED_SYRINGE | INTRAVENOUS | Status: AC
  Filled 2017-03-04: qty 10

## 2017-03-04 MED ORDER — FENTANYL CITRATE (PF) 100 MCG/2ML IJ SOLN
25.0000 ug | INTRAMUSCULAR | Status: DC | PRN
  Administered 2017-03-04: 25 ug via INTRAVENOUS
  Filled 2017-03-04: qty 1

## 2017-03-04 MED ORDER — GENTAMICIN SULFATE 40 MG/ML IJ SOLN
INTRAVENOUS | Status: DC | PRN
  Administered 2017-03-04: 440 mg via INTRAVENOUS

## 2017-03-04 MED ORDER — METOCLOPRAMIDE HCL 5 MG/ML IJ SOLN
INTRAMUSCULAR | Status: AC
Start: 2017-03-04 — End: 2017-03-04
  Filled 2017-03-04: qty 2

## 2017-03-04 MED ORDER — LIDOCAINE HCL 1 % IJ SOLN
INTRAMUSCULAR | Status: DC | PRN
  Administered 2017-03-04: 20 mL

## 2017-03-04 SURGICAL SUPPLY — 25 items
ABLATOR ENDOMETRIAL BIPOLAR (ABLATOR) ×3 IMPLANT
CATH ROBINSON RED A/P 16FR (CATHETERS) ×3 IMPLANT
COVER BACK TABLE 60X90IN (DRAPES) ×6 IMPLANT
DILATOR CANAL MILEX (MISCELLANEOUS) IMPLANT
DRAPE LG THREE QUARTER DISP (DRAPES) ×3 IMPLANT
DRSG TELFA 3X8 NADH (GAUZE/BANDAGES/DRESSINGS) ×3 IMPLANT
ELECT REM PT RETURN 9FT ADLT (ELECTROSURGICAL)
ELECTRODE REM PT RTRN 9FT ADLT (ELECTROSURGICAL) IMPLANT
GLOVE BIO SURGEON STRL SZ8 (GLOVE) ×3 IMPLANT
GLOVE BIOGEL PI IND STRL 8 (GLOVE) ×2 IMPLANT
GLOVE BIOGEL PI INDICATOR 8 (GLOVE) ×4
GOWN STRL REUS W/TWL LRG LVL3 (GOWN DISPOSABLE) ×6 IMPLANT
KIT RM TURNOVER CYSTO AR (KITS) ×3 IMPLANT
LEGGING LITHOTOMY PAIR STRL (DRAPES) ×3 IMPLANT
NEEDLE SPNL 22GX3.5 QUINCKE BK (NEEDLE) ×3 IMPLANT
PACK BASIN DAY SURGERY FS (CUSTOM PROCEDURE TRAY) ×3 IMPLANT
PAD OB MATERNITY 4.3X12.25 (PERSONAL CARE ITEMS) ×3 IMPLANT
PAD PREP 24X48 CUFFED NSTRL (MISCELLANEOUS) ×3 IMPLANT
SEAL ROD LENS SCOPE MYOSURE (ABLATOR) ×3 IMPLANT
SYR CONTROL 10ML LL (SYRINGE) ×3 IMPLANT
TOWEL OR 17X24 6PK STRL BLUE (TOWEL DISPOSABLE) ×6 IMPLANT
TUBE CONNECTING 12'X1/4 (SUCTIONS)
TUBE CONNECTING 12X1/4 (SUCTIONS) IMPLANT
TUBING AQUILEX INFLOW (TUBING) ×3 IMPLANT
TUBING AQUILEX OUTFLOW (TUBING) ×3 IMPLANT

## 2017-03-04 NOTE — Anesthesia Procedure Notes (Signed)
Procedure Name: LMA Insertion Date/Time: 03/04/2017 12:52 PM Performed by: Jessica Priest Pre-anesthesia Checklist: Patient identified, Emergency Drugs available, Suction available and Patient being monitored Patient Re-evaluated:Patient Re-evaluated prior to inductionOxygen Delivery Method: Circle system utilized Preoxygenation: Pre-oxygenation with 100% oxygen Intubation Type: IV induction Ventilation: Mask ventilation without difficulty LMA: LMA inserted LMA Size: 4.0 Number of attempts: 1 Airway Equipment and Method: Bite block Placement Confirmation: positive ETCO2 Tube secured with: Tape Dental Injury: Teeth and Oropharynx as per pre-operative assessment

## 2017-03-04 NOTE — Anesthesia Preprocedure Evaluation (Addendum)
Anesthesia Evaluation  Patient identified by MRN, date of birth, ID band Patient awake    Reviewed: Allergy & Precautions, NPO status , Patient's Chart, lab work & pertinent test results  Airway Mallampati: II  TM Distance: >3 FB Neck ROM: Full    Dental  (+) Teeth Intact   Pulmonary asthma , former smoker,    breath sounds clear to auscultation       Cardiovascular negative cardio ROS   Rhythm:Regular Rate:Normal     Neuro/Psych  Headaches, Seizures -,  PSYCHIATRIC DISORDERS    GI/Hepatic Neg liver ROS, GERD  Medicated,  Endo/Other  negative endocrine ROS  Renal/GU negative Renal ROS  negative genitourinary   Musculoskeletal negative musculoskeletal ROS (+)   Abdominal   Peds negative pediatric ROS (+)  Hematology negative hematology ROS (+)   Anesthesia Other Findings   Reproductive/Obstetrics negative OB ROS                            Lab Results  Component Value Date   WBC 11.6 (H) 03/03/2017   HGB 10.6 (L) 03/03/2017   HCT 34.2 (L) 03/03/2017   MCV 73.1 (L) 03/03/2017   PLT 314 03/03/2017   Lab Results  Component Value Date   CREATININE 0.74 03/03/2017   BUN 15 03/03/2017   NA 138 03/03/2017   K 4.2 03/03/2017   CL 104 03/03/2017   CO2 26 03/03/2017   No results found for: INR, PROTIME  EKG: sinus tachycardia.  Anesthesia Physical Anesthesia Plan  ASA: III  Anesthesia Plan: General   Post-op Pain Management:    Induction: Intravenous  Airway Management Planned: LMA  Additional Equipment:   Intra-op Plan:   Post-operative Plan: Extubation in OR  Informed Consent: I have reviewed the patients History and Physical, chart, labs and discussed the procedure including the risks, benefits and alternatives for the proposed anesthesia with the patient or authorized representative who has indicated his/her understanding and acceptance.   Dental advisory  given  Plan Discussed with: CRNA  Anesthesia Plan Comments:        Anesthesia Quick Evaluation

## 2017-03-04 NOTE — Progress Notes (Signed)
No changes to H&P per patient history Reviewed with patient procedure-H/S, D&C, EMA All questions answered Post op instructions reviewed Patient states she understands and agrees

## 2017-03-04 NOTE — Anesthesia Postprocedure Evaluation (Addendum)
Anesthesia Post Note  Patient: Lindsay Aguilar  Procedure(s) Performed: Procedure(s) (LRB): DILATATION & CURETTAGE/HYSTEROSCOPY WITH NOVASURE ABLATION (N/A)  Patient location during evaluation: PACU Anesthesia Type: General Level of consciousness: sedated Pain management: pain level controlled Vital Signs Assessment: post-procedure vital signs reviewed and stable Respiratory status: spontaneous breathing and respiratory function stable Cardiovascular status: stable Anesthetic complications: no       Last Vitals:  Vitals:   03/04/17 1400 03/04/17 1415  BP: 113/64 132/85  Pulse: 72 79  Resp: 14 18  Temp:      Last Pain:  Vitals:   03/04/17 1415  TempSrc:   PainSc: Asleep                 SINGER,JAMES DANIEL

## 2017-03-04 NOTE — Discharge Instructions (Signed)
° °  D & C Home care Instructions: ° ° °Personal hygiene:  Used sanitary napkins for vaginal drainage not tampons. Shower or tub bathe the day after your procedure. No douching until bleeding stops. Always wipe from front to back after  Elimination. ° °Activity: Do not drive or operate any equipment today. The effects of the anesthesia are still present and drowsiness may result. Rest today, not necessarily flat bed rest, just take it easy. You may resume your normal activity in one to 2 days. ° °Sexual activity: No intercourse for one week or as indicated by your physician ° °Diet: Eat a light diet as desired this evening. You may resume a regular diet tomorrow. ° °Return to work: One to 2 days. ° °General Expectations of your surgery: Vaginal bleeding should be no heavier than a normal period. Spotting may continue up to 10 days. Mild cramps may continue for a couple of days. You may have a regular period in 2-6 weeks. ° °Unexpected observations call your doctor if these occur: persistent or heavy bleeding. Severe abdominal cramping or pain. Elevation of temperature greater than 100°F. ° °Call for an appointment in one week. ° ° °Post Anesthesia Home Care Instructions ° °Activity: °Get plenty of rest for the remainder of the day. A responsible individual must stay with you for 24 hours following the procedure.  °For the next 24 hours, DO NOT: °-Drive a car °-Operate machinery °-Drink alcoholic beverages °-Take any medication unless instructed by your physician °-Make any legal decisions or sign important papers. ° °Meals: °Start with liquid foods such as gelatin or soup. Progress to regular foods as tolerated. Avoid greasy, spicy, heavy foods. If nausea and/or vomiting occur, drink only clear liquids until the nausea and/or vomiting subsides. Call your physician if vomiting continues. ° °Special Instructions/Symptoms: °Your throat may feel dry or sore from the anesthesia or the breathing tube placed in your throat  during surgery. If this causes discomfort, gargle with warm salt water. The discomfort should disappear within 24 hours. ° °If you had a scopolamine patch placed behind your ear for the management of post- operative nausea and/or vomiting: ° °1. The medication in the patch is effective for 72 hours, after which it should be removed.  Wrap patch in a tissue and discard in the trash. Wash hands thoroughly with soap and water. °2. You may remove the patch earlier than 72 hours if you experience unpleasant side effects which may include dry mouth, dizziness or visual disturbances. °3. Avoid touching the patch. Wash your hands with soap and water after contact with the patch. °  ° °

## 2017-03-04 NOTE — Progress Notes (Signed)
03/04/2017  1:29 PM  PATIENT:  Lindsay Aguilar  36 y.o. female  PRE-OPERATIVE DIAGNOSIS:  menorrhagia  POST-OPERATIVE DIAGNOSIS:  menorrhagia  PROCEDURE:  Procedure(s): DILATATION & CURETTAGE/HYSTEROSCOPY WITH NOVASURE ABLATION (N/A)  SURGEON:  Surgeon(s) and Role:    * Harold Hedge, MD - Primary  PHYSICIAN ASSISTANT:   ASSISTANTS: none   ANESTHESIA:   general  EBL:  No intake/output data recorded.  BLOOD ADMINISTERED:none  DRAINS: none   LOCAL MEDICATIONS USED:  LIDOCAINE  and Amount: 20 ml  SPECIMEN:  Source of Specimen:  endometrial currettings  DISPOSITION OF SPECIMEN:  PATHOLOGY  COUNTS:  YES  TOURNIQUET:  * No tourniquets in log *  DICTATION: .Other Dictation: Dictation Number [id number]  PLAN OF CARE: Discharge to home after PACU  PATIENT DISPOSITION:  PACU - hemodynamically stable.   Delay start of Pharmacological VTE agent (>24hrs) due to surgical blood loss or risk of bleeding: not applicable

## 2017-03-04 NOTE — Transfer of Care (Signed)
Immediate Anesthesia Transfer of Care Note  Patient: Lindsay Aguilar  Procedure(s) Performed: Procedure(s) (LRB): DILATATION & CURETTAGE/HYSTEROSCOPY WITH NOVASURE ABLATION (N/A)  Patient Location: PACU  Anesthesia Type: General  Level of Consciousness: awake, sedated, patient cooperative and responds to stimulation  Airway & Oxygen Therapy: Patient Spontanous Breathing and Patient connected to NCO2  Post-op Assessment: Report given to PACU RN, Post -op Vital signs reviewed and stable and Patient moving all extremities  Post vital signs: Reviewed and stable  Complications: No apparent anesthesia complications

## 2017-03-05 ENCOUNTER — Encounter (HOSPITAL_BASED_OUTPATIENT_CLINIC_OR_DEPARTMENT_OTHER): Payer: Self-pay | Admitting: Obstetrics and Gynecology

## 2017-03-05 NOTE — Op Note (Signed)
NAME:  Lindsay Aguilar, Lindsay Aguilar NO.:  MEDICAL RECORD NO.:  [id number]  LOCATION:                                 FACILITY:  PHYSICIAN:  Guy Sandifer. Henderson Cloud, M.D.      DATE OF BIRTH:  DATE OF PROCEDURE:  03/04/2017 DATE OF DISCHARGE:                              OPERATIVE REPORT   PREOPERATIVE DIAGNOSIS:  Menorrhagia.  POSTOPERATIVE DIAGNOSIS:  Menorrhagia.  PROCEDURE:  Hysteroscopy, dilation and curettage, NovaSure endometrial ablation.  SURGEON:  Guy Sandifer. Henderson Cloud, M.D.  ANESTHESIA:  General with LMA.  ESTIMATED BLOOD LOSS:  50 mL  I and O's of distending media 150 mL deficit.  SPECIMENS:  Endometrial curettings to Pathology.  INDICATIONS AND CONSENT:  This patient is a 36 year old multiparous patient, status post tubal ligation with debilitating menses.  Details have been dictated in history and physical.  Hysteroscopy, D and C with NovaSure endometrial ablation has been discussed preoperatively. Potential risks and complications were reviewed preoperatively including, but not limited to infection, uterine perforation, organ damage, bleeding requiring transfusion of blood products with HIV and hepatitis acquisition, DVT, PE, pneumonia, persistent or recurrent heavy bleeding, pelvic pain, painful intercourse, laparoscopy, and laparotomy. All questions have been answered.  The patient states she understands and agrees and consent was signed on the chart.  FINDINGS:  Both fallopian tube and ostia are identified.  No abnormal structures were noted.  DESCRIPTION OF PROCEDURE:  The patient was taken to the operating room. She was identified, placed in dorsal supine position and general anesthesia was induced via LMA.  She was placed in dorsal lithotomy position.  Time-out undertaken.  She was prepped with Betadine.  Bladder straight catheterized and she was draped in a sterile fashion.  A bivalve speculum was placed.  Anterior cervical lip was injected with  1% plain lidocaine and grasped with single-tooth tenaculum.  Paracervical block was placed in the 2, 4, 5, 7, 8, and 10 o'clock positions with approximately 20 mL of the same solution.  The cervical canal measures 5 cm and the total sounding depth is 10 cm.  Cervix was gently progressively dilated.  Hysteroscope was placed into the cervical canal and advanced under direct visualization using distending media.  The above findings were noted.  Hysteroscope was withdrawn.  Sharp curettage was carried out.  The NovaSure device was then placed and a cavity width of 4.4 cm was noted.  Cavity test was passed on the first attempt.  Ablation was then carried out. The device was withdrawn intact.  Reinspection with the hysteroscope reveals a good cautery effect throughout the cavity.  Instruments were removed.  Good hemostasis was noted.  The patient was awakened and taken to the recovery room in stable condition.     Guy Sandifer Henderson Cloud, M.D.     JET/MEDQ  D:  03/04/2017  T:  03/04/2017  Job:  [id number]

## 2017-03-25 ENCOUNTER — Other Ambulatory Visit: Payer: Self-pay | Admitting: Nurse Practitioner

## 2017-04-16 NOTE — Addendum Note (Signed)
Addendum  created 04/16/17 1400 by Kealey Kemmer, MD   Sign clinical note    

## 2017-05-14 ENCOUNTER — Telehealth: Payer: Self-pay | Admitting: Nurse Practitioner

## 2017-05-14 NOTE — Telephone Encounter (Addendum)
Spoke to wife and husband.  As per below, ? Sz, no missed medications, I went over possible sz, stroke sx.  Made appt for 145pm on Monday. Both verbalized understanding. I has other spells and concerned can go to ED for eval.

## 2017-05-14 NOTE — Telephone Encounter (Signed)
Pt called with concern of what took place last night. Pt was with husband and friend, they were speaking and then for 30 sec to 1 min. Pt said she continued to hear her husband and friend speaking and in her mind she responded.  Pt said her husband told her in that time she just  Had a blank stare and mumbled an irrelevant response of its finally..the patient said they called the on call Dr and her husband not in agreement with what they were told about it being just stress and not a seziure, pt wanting a second opinion. She is asking for a call back

## 2017-05-17 ENCOUNTER — Ambulatory Visit: Payer: Self-pay | Admitting: Nurse Practitioner

## 2017-05-18 ENCOUNTER — Encounter: Payer: Self-pay | Admitting: Nurse Practitioner

## 2017-07-21 ENCOUNTER — Ambulatory Visit: Payer: 59 | Admitting: Nurse Practitioner

## 2017-08-03 ENCOUNTER — Other Ambulatory Visit: Payer: Self-pay | Admitting: Neurology

## 2017-08-03 ENCOUNTER — Other Ambulatory Visit: Payer: Self-pay | Admitting: Nurse Practitioner

## 2017-08-03 DIAGNOSIS — K219 Gastro-esophageal reflux disease without esophagitis: Secondary | ICD-10-CM

## 2017-08-03 DIAGNOSIS — R0683 Snoring: Secondary | ICD-10-CM

## 2017-08-05 ENCOUNTER — Other Ambulatory Visit: Payer: Self-pay | Admitting: Neurology

## 2017-08-05 DIAGNOSIS — K219 Gastro-esophageal reflux disease without esophagitis: Secondary | ICD-10-CM

## 2017-08-05 DIAGNOSIS — R0683 Snoring: Secondary | ICD-10-CM

## 2017-08-05 MED ORDER — OMEPRAZOLE 20 MG PO CPDR: 20.0000 mg | DELAYED_RELEASE_CAPSULE | Freq: Every day | ORAL | 0 refills | Status: DC

## 2017-08-18 NOTE — Progress Notes (Signed)
[facility]  PATIENT: Lindsay Aguilar DOB: 10-Oct-1981   REASON FOR VISIT: History of seizure disorder during sleep, restless leg syndrome, acid reflux from Keppra  HISTORY FROM: Patient    HISTORY OF PRESENT ILLNESS: HISTORY PSMs Lindsay Aguilar is a 36 year old pleasant Caucasian lady who 3 days ago on Monday night had an episode of witnessed seizure in sleep. She is unable to describe the episode and the husband was an eyewitness.. The patient and husband went to bed at the usual time. Her daughter who sleeps with them woke up and shouted.at about 2 AM which woke up the husband who noticed that his wife had tonic posturing of her upper extremities with tremulousness. Her mouth was closed. She had tongue bite. She had incontinence of urine. The patient stopped shaking after 3-5 minutes. She appeared to be postictal with transient disorientation and confusion. She recalls the EMS arriving to the house.and her disorientation started clearing by the time she reached the hospital. She had noncontrast CT scan of the head done on 08/16/2015 which I personally reviewed and is unremarkable except for evidence of incidental sinusitis. Basic metabolic panel labs and CBC were unremarkable. Patient had started a cold medication on that day which include atenolol, guaifenesin and phenylephrine. She has no known prior history of seizures, significant head injury with loss of consciousness, childhood meningitis, neurological illness. She denies drinking or recreational abuse drugs or drinking significant amounts of alcohol. There is no family history of epilepsy either. She feels back to baseline and has no complaints today. She denies sleep deprivation, significant stress, irregular eating and sleeping habits. She does not drink excessive amounts of caffeine Update 10/22/2015 ; PSShe returns for follow-up after initial consultation visit 2 and half months ago. She continues to do well without  recurrent seizure episodes. She had an MRI scan of the brain done on 08/19/15 which I personally reviewed and is normal without any structural abnormalities in the brain. EEG done on 08/22/15 is normal without definite epileptiform activity. Patient states she is not had any further seizure-like episodes or any other neurological complaints. She is keen to start driving again soon UPDATE 04/03/2017CM Ms Lindsay Aguilar, 36 year old female returns for follow-up. She has a history of 2 seizure events since September both occurred while sleeping. Her most recent seizure on 02/12/16 occurred while she was napping in the afternoon with her daughter. She had some generalized shaking of her extremities that lasted approximately 3-4 minutes with loss of consciousness and incontinence of urine. She is markedly obese and may have sleep apnea. After her most recent event she needs anticonvulsant medications. MRI of the brain in the past has been normal. EEG has been normal UPDATE 07/21/2017CM. [name] 36 year old female returns for follow-up. She was last seen in April and had 2 previous seizure events. She was placed on Keppra at her last visit and asked to get a sleep study . 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 She was advised to lose weight and exercise. In addition there were frequent periodic limb movements of sleep with associated sleep disruption. Will check ferritin level and iron studies. She has had a low hemoglobin in the past. She returns for reevaluation. She has not had further seizure events. She was made aware she still cannot drive for 3 months since her last seizure was 02/12/2016. UPDATE 10/04/2018CM [name], 36 year old female returns for follow-up with history of seizure disorder  currently on Keppra with side effects of acid reflux. She has been placed on Prilosec. Last seizure event was in April 2017.  She has a history of restless legs confirmed by sleep study .Her iron studies and ferritin have been normal. She remains overweight as been encouraged to lose weight and exercise. She is currently going through a divorce which she reportedly claims is very stressful for her particularly under the circumstances which it occurred. She caught  her husband watching her sister take a shower. She returns for reevaluation. She had an endometrial ablation in April 2018 She is wanting to be placed on Valium for her anxiety however she was advised to follow-up with her primary care for that . Valium is a controlled substance.  REVIEW OF SYSTEMS: Full 14 system review of systems performed and notable only for those listed, all others are neg:  Constitutional: neg  Cardiovascular: neg Ear/Nose/Throat: neg  Skin: neg Eyes: neg Respiratory: neg Gastroitestinal: neg  Hematology/Lymphatic: neg  Endocrine: neg Musculoskeletal:neg Allergy/Immunology: neg Neurological: History of seizure disorder Psychiatric: neg Sleep : neg   ALLERGIES: Allergies  Allergen Reactions  . Amoxicillin Other (See Comments)    Yeast infection  . Ephedrine Other (See Comments)    Avoids due to possible cause of seizure    HOME MEDICATIONS: Outpatient Medications Prior to Visit  Medication Sig Dispense Refill  . levETIRAcetam (KEPPRA) 500 MG tablet TAKE 1 TABLET BY MOUTH TWICE DAILY 60 tablet 0  . omeprazole (PRILOSEC) 20 MG capsule Take 1 capsule (20 mg total) by mouth daily. 30 capsule 0  . rOPINIRole (REQUIP) 0.25 MG tablet TAKE 1 TABLET(0.25 MG) BY MOUTH AT BEDTIME 30 tablet 11   No facility-administered medications prior to visit.     PAST MEDICAL HISTORY: Past Medical History:  Diagnosis Date  . GERD (gastroesophageal reflux disease)   . History of CHF (congestive heart failure)    2006 during first preg. dx chf--  evaluated by cardologist (dr Melburn Popper)-- resolved after birth (no issue w/ other preg's)  . Iron  deficiency anemia   . Menometrorrhagia   . Nocturnal seizures [facility]) neurologist-  dr sethi/ dr Anne [facility])   first dx 09/ 2016-- currently taking keppra---  pt feels caused by Mirena IUD that had been just placed since then removed 03/ 2017 after last seizure  . RLS (restless legs syndrome)   . Seasonal allergies   . Wears glasses     PAST SURGICAL HISTORY: Past Surgical History:  Procedure Laterality Date  . DILITATION & CURRETTAGE/HYSTROSCOPY WITH NOVASURE ABLATION N/A 03/04/2017   Procedure: DILATATION & CURETTAGE/HYSTEROSCOPY WITH NOVASURE ABLATION;  Surgeon: Harold Hedge, MD;  Location[facility];  Service: Gynecology;  Laterality: N/A;  . LAPAROSCOPIC CHOLECYSTECTOMY  04/2008  . LAPAROSCOPIC TUBAL LIGATION Bilateral 02/2016  . TRANSTHORACIC ECHOCARDIOGRAM  03/09/2014   ef 55-60%/  trivial MR and TR  . WISDOM TOOTH EXTRACTION  age 9    FAMILY HISTORY: Family History  Problem Relation Age of Onset  . Anesthesia problems Other   . Depression Father     SOCIAL HISTORY: Social History   Social History  . Marital status: Separated    Spouse name: N/A  . Number of children: N/A  . Years of education: N/A   Occupational History  . Not on file.   Social History Main Topics  . Smoking status: Former Smoker    Years: 10.00    Types: Cigarettes    Quit date: 11/16/2006  .  Smokeless tobacco: Never Used  . Alcohol use 0.6 oz/week    1 Glasses of wine per week     Comment: rare  . Drug use: No  . Sexual activity: Yes    Birth control/ protection: Surgical   Other Topics Concern  . 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 Generalized: Well developed, morbidly obese female in no acute distress  Head: normocephalic and atraumatic,. Oropharynx benign  Neck: Supple,  Cardiac: Regular rate rhythm, no murmur  Musculoskeletal: No deformity   Neurological examination   Mentation: Alert oriented to time, place, history taking. Attention span and concentration appropriate. Recent and remote memory intact.  Follows all commands speech and language fluent.   Cranial nerve II-XII: .Pupils were equal round reactive to light extraocular movements were full, visual field were full on confrontational test. Facial sensation and strength were normal. hearing was intact to finger rubbing bilaterally. Uvula tongue midline. head turning and shoulder shrug were normal and symmetric.Tongue protrusion into cheek strength was normal. Motor: normal bulk and tone, full strength in the BUE, BLE, fine finger movements normal,  Sensory: normal and symmetric to light touch, upper and lower extremities Coordination: finger-nose-finger, heel-to-shin bilaterally, no dysmetria, no tremor Reflexes: 1+ upper lower and symmetric plantar responses were flexor bilaterally. Gait and Station: Rising up from seated position without assistance, normal stance,  moderate stride, good arm swing, smooth turning, able to perform tiptoe, and heel walking without difficulty. Tandem gait is steady    DIAGNOSTIC DATA (LABS, IMAGING, TESTING) - I reviewed patient records, labs, notes, testing and imaging myself where available.  Lab Results  Component Value Date   WBC 11.6 (H) 03/03/2017   HGB 10.6 (L) 03/03/2017   HCT 34.2 (L) 03/03/2017   MCV 73.1 (L) 03/03/2017   PLT 314 03/03/2017      Component Value Date/Time   Aguilar 138 03/03/2017 1159   K 4.2 03/03/2017 1159   CL 104 03/03/2017 1159   CO2 26 03/03/2017 1159   GLUCOSE 133  (H) 03/03/2017 1159   BUN 15 03/03/2017 1159   CREATININE 0.74 03/03/2017 1159   CALCIUM 9.0 03/03/2017 1159   PROT 7.6 03/03/2017 1159   ALBUMIN 3.8 03/03/2017 1159   AST 16 03/03/2017 1159   ALT 17 03/03/2017 1159   ALKPHOS 52 03/03/2017 1159   BILITOT 0.4 03/03/2017 1159   GFRNONAA >60 03/03/2017 1159   GFRAA >60 03/03/2017 1159   Lab Results  Component Value Date   CHOL 217 (A) 01/08/2016   HDL 31 (A) 01/08/2016   LDLCALC 158 01/08/2016   LDLDIRECT 160.6 01/04/2014   TRIG 138 01/08/2016   CHOLHDL 6 09/14/2014   Lab Results  Component Value Date   HGBA1C 5.6 01/08/2016    ASSESSMENT AND PLAN 36 y.o. year old female has a past medical history of 2 separate seizure events  while sleeping. She is morbidly obese. With  her most recent seizure she had generalized jerking of the extremities loss of consciousness and incontinence of urine. MRI of the brain in the past has been normal EEG has been normal most recent event occurred on 02/12/2016. 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 She was advised to lose weight and exercise. In addition there were frequent periodic limb movements of sleep with associated sleep disruption. Ferritin level and iron studies were normal. She is currently on Keppra 500 twice daily without further seizure events however has acid reflux from Keppra. On Prilosec, She is currently in the process of divorce.The patient is a current patient of Dr. Pearlean Brownie  who is out of the office today . This note is sent to the work in doctor.     Continue Keppra at current dose will refill Continue Requip for restless legs . Continue Prilosec  as directed Call for any seizure activity Please remember, common seizure triggers are: Sleep deprivation, dehydration, overheating, stress, hypoglycemia or skipping meals, certain medications or excessive alcohol use, especially stopping alcohol  abruptly if you have had heavy alcohol use before (aka alcohol withdrawal seizure). If you have a prolonged seizure over 2-5 minutes or back to back seizures, call or have someone call 911 or take you to the nearest emergency room. You cannot drive a car or operate any other machinery or vehicle within 6 months of a seizure. Please do not swim alone or take a tub bath for safety. Do not cook with large quantities of boiling water or oil for safety. Take your medicine for seizure prevention regularly and do not skip doses or stop medication abruptly and tone are told to do so by your healthcare provider. Try to get a refill on your antiepileptic medication ahead of time, so you are not at risk of running out. If you run out of the seizure medication and do not have a refill at hand she may run into medication withdrawal seizures. Avoid taking Wellbutrin, narcotic pain medications and tramadol, as they can lower seizure threshold.  Follow-up yearly and PRN  Call for any seizure activity I spent 25 minutes in total face to face time with the patient more than 50% of which was spent counseling and coordination of care, reviewing test results reviewing medications and discussing and reviewing the diagnosis of seizure disorder restless legs and common seizure triggers. . She was encouraged exercise and lose weight for overall health and well-being. Labs from April 2018 reviewed. CBC and CMP [facility] [address] [place], [place] [address] [phone]

## 2017-08-19 ENCOUNTER — Ambulatory Visit (INDEPENDENT_AMBULATORY_CARE_PROVIDER_SITE_OTHER): Payer: 59 | Admitting: Nurse Practitioner

## 2017-08-19 ENCOUNTER — Encounter: Payer: Self-pay | Admitting: Nurse Practitioner

## 2017-08-19 VITALS — BP 109/75 | HR 72 | Ht 66.0 in | Wt 281.0 lb

## 2017-08-19 DIAGNOSIS — G2581 Restless legs syndrome: Secondary | ICD-10-CM | POA: Diagnosis not present

## 2017-08-19 DIAGNOSIS — R569 Unspecified convulsions: Secondary | ICD-10-CM | POA: Diagnosis not present

## 2017-08-19 DIAGNOSIS — K219 Gastro-esophageal reflux disease without esophagitis: Secondary | ICD-10-CM

## 2017-08-19 DIAGNOSIS — Z6841 Body Mass Index (BMI) 40.0 and over, adult: Secondary | ICD-10-CM

## 2017-08-19 MED ORDER — LEVETIRACETAM 500 MG PO TABS: 500.0000 mg | ORAL_TABLET | Freq: Two times a day (BID) | ORAL | 3 refills | Status: DC

## 2017-08-19 MED ORDER — OMEPRAZOLE 20 MG PO CPDR: 20.0000 mg | DELAYED_RELEASE_CAPSULE | Freq: Every day | ORAL | 3 refills | Status: DC

## 2017-08-19 MED ORDER — ROPINIROLE HCL 0.25 MG PO TABS: ORAL_TABLET | ORAL | 3 refills | Status: DC

## 2017-08-19 NOTE — Patient Instructions (Signed)
Continue Keppra at current dose will refill Continue Requip for restless legs . Continue Prilosec  as directed Call for any seizure activity Please remember, common seizure triggers are: Sleep deprivation, dehydration, overheating, stress, hypoglycemia or skipping meals, certain medications or excessive alcohol use, especially stopping alcohol abruptly if you have had heavy alcohol use before (aka alcohol withdrawal seizure). If you have a prolonged seizure over 2-5 minutes or back to back seizures, call or have someone call 911 or take you to the nearest emergency room. You cannot drive a car or operate any other machinery or vehicle within 6 months of a seizure. Please do not swim alone or take a tub bath for safety. Do not cook with large quantities of boiling water or oil for safety. Take your medicine for seizure prevention regularly and do not skip doses or stop medication abruptly and tone are told to do so by your healthcare provider. Try to get a refill on your antiepileptic medication ahead of time, so you are not at risk of running out. If you run out of the seizure medication and do not have a refill at hand she may run into medication withdrawal seizures. Avoid taking Wellbutrin, narcotic pain medications and tramadol, as they can lower seizure threshold.  Follow-up yearly and PRN  Call for any seizure activity

## 2017-08-19 NOTE — Progress Notes (Signed)
I have read the note, and I agree with the clinical assessment and plan.  WILLIS,CHARLES KEITH   

## 2017-09-02 ENCOUNTER — Other Ambulatory Visit: Payer: Self-pay | Admitting: Neurology

## 2017-09-02 ENCOUNTER — Other Ambulatory Visit: Payer: Self-pay | Admitting: Nurse Practitioner

## 2017-09-02 ENCOUNTER — Telehealth: Payer: Self-pay | Admitting: *Deleted

## 2017-09-02 DIAGNOSIS — K219 Gastro-esophageal reflux disease without esophagitis: Secondary | ICD-10-CM

## 2017-09-02 DIAGNOSIS — R0683 Snoring: Secondary | ICD-10-CM

## 2017-09-02 NOTE — Telephone Encounter (Signed)
LMVM for t to return call, checking her her pharmacy, meds escribed [facility], using [facility] nos?

## 2017-09-09 NOTE — Telephone Encounter (Signed)
Using [facility], no other needs at this time.

## 2018-06-14 NOTE — Progress Notes (Signed)
[facility]  PATIENT: Lindsay Aguilar DOB: Jan 20, 1981   REASON FOR VISIT:  seizure disorder with recent seizure , restless leg syndrome, acid reflux from Keppra  HISTORY FROM: Patient    HISTORY OF PRESENT ILLNESS: HISTORY PSMs Lindsay Aguilar is a 37 year old pleasant Caucasian lady who 3 days ago on Monday night had an episode of witnessed seizure in sleep. She is unable to describe the episode and the husband was an eyewitness.. The patient and husband went to bed at the usual time. Her daughter who sleeps with them woke up and shouted.at about 2 AM which woke up the husband who noticed that his wife had tonic posturing of her upper extremities with tremulousness. Her mouth was closed. She had tongue bite. She had incontinence of urine. The patient stopped shaking after 3-5 minutes. She appeared to be postictal with transient disorientation and confusion. She recalls the EMS arriving to the house.and her disorientation started clearing by the time she reached the hospital. She had noncontrast CT scan of the head done on 08/16/2015 which I personally reviewed and is unremarkable except for evidence of incidental sinusitis. Basic metabolic panel labs and CBC were unremarkable. Patient had started a cold medication on that day which include atenolol, guaifenesin and phenylephrine. She has no known prior history of seizures, significant head injury with loss of consciousness, childhood meningitis, neurological illness. She denies drinking or recreational abuse drugs or drinking significant amounts of alcohol. There is no family history of epilepsy either. She feels back to baseline and has no complaints today. She denies sleep deprivation, significant stress, irregular eating and sleeping habits. She does not drink excessive amounts of caffeine Update 10/22/2015 ; PSShe returns for follow-up after initial consultation visit 2 and half months ago. She continues to do well without  recurrent seizure episodes. She had an MRI scan of the brain done on 08/19/15 which I personally reviewed and is normal without any structural abnormalities in the brain. EEG done on 08/22/15 is normal without definite epileptiform activity. Patient states she is not had any further seizure-like episodes or any other neurological complaints. She is keen to start driving again soon UPDATE 04/03/2017CM Lindsay Aguilar, 37 year old female returns for follow-up. She has a history of 2 seizure events since September both occurred while sleeping. Her most recent seizure on 02/12/16 occurred while she was napping in the afternoon with her daughter. She had some generalized shaking of her extremities that lasted approximately 3-4 minutes with loss of consciousness and incontinence of urine. She is markedly obese and may have sleep apnea. After her most recent event she needs anticonvulsant medications. MRI of the brain in the past has been normal. EEG has been normal UPDATE 07/21/2017CM. Lindsay. [name] 37 year old female returns for follow-up. She was last seen in April and had 2 previous seizure events. She was placed on Keppra at her last visit and asked to get a sleep study . 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 She was advised to lose weight and exercise. In addition there were frequent periodic limb movements of sleep with associated sleep disruption. Will check ferritin level and iron studies. She has had a low hemoglobin in the past. She returns for reevaluation. She has not had further seizure events. She was made aware she still cannot drive for 3 months since her last seizure was 02/12/2016. UPDATE 10/04/2018CM Lindsay. Aguilar, 37 year old female returns for follow-up with history of seizure  disorder currently on Keppra with side effects of acid reflux. She has been placed on Prilosec. Last seizure event was in April 2017.  She has a history of restless legs confirmed by sleep study .Her iron studies and ferritin have been normal. She remains overweight as been encouraged to lose weight and exercise. She is currently going through a divorce which she reportedly claims is very stressful for her particularly under the circumstances which it occurred. She caught  her husband watching her sister take a shower. She returns for reevaluation. She had an endometrial ablation in April 2018 She is wanting to be placed on Valium for her anxiety however she was advised to follow-up with her primary care for that . Valium is a controlled substance. UPDATE 7/31/2019CM Lindsay. Aguilar, 37 year old female returns for follow-up with history of seizure disorder currently on Keppra and has had side effects of acid reflux in the past.  She reports in May she felt like she was going to have a seizure so she increased her dose on her own without calling us.  She increased it to 1-1/2 tabs in the morning and 1 tab at night of the 500 mg.  She had a seizure last night getting out of the car.  Her significant other helped her to the ground it just lasted moments no apparent injury except some scrapes on her feet and hands.  Significant other reports that since she increased the dose back in May she has been more irritable and agitated.  Prior to this last seizure was in April 2017.  She returns for reevaluation REVIEW OF SYSTEMS: Full 14 system review of systems performed and notable only for those listed, all others are neg:  Constitutional: neg  Cardiovascular: neg Ear/Nose/Throat: neg  Skin: neg Eyes: neg Respiratory: neg Gastroitestinal: neg  Hematology/Lymphatic: Easy bruising anemia Endocrine: neg Musculoskeletal:neg Allergy/Immunology: neg Neurological:  seizure disorder with recent seizure  psychiatric: neg Sleep : Restless legs   ALLERGIES: Allergies  Allergen Reactions  . Amoxicillin Other (See Comments)    Yeast infection  .  Ephedrine Other (See Comments)    Avoids due to possible cause of seizure    HOME MEDICATIONS: Outpatient Medications Prior to Visit  Medication Sig Dispense Refill  . levETIRAcetam (KEPPRA) 500 MG tablet Take 1 tablet (500 mg total) by mouth 2 (two) times daily. 180 tablet 3  . omeprazole (PRILOSEC) 20 MG capsule Take 1 capsule (20 mg total) by mouth daily. 90 capsule 3  . rOPINIRole (REQUIP) 0.25 MG tablet TAKE 1 TABLET(0.25 MG) BY MOUTH AT BEDTIME 90 tablet 3  . omeprazole (PRILOSEC) 20 MG capsule TAKE 1 CAPSULE(20 MG) BY MOUTH DAILY 30 capsule 0   No facility-administered medications prior to visit.     PAST MEDICAL HISTORY: Past Medical History:  Diagnosis Date  . GERD (gastroesophageal reflux disease)   . History of CHF (congestive heart failure)    2006 during first preg. dx chf--  evaluated by cardologist (dr Melburn Poppernasher)-- resolved after birth (no issue w/ other preg's)  . Iron deficiency anemia   . Menometrorrhagia   . Nocturnal seizures [facility](HCC) neurologist-  dr sethi/ dr Anne Hahnwillis [facility])   last sz 06/14/18  . RLS (restless legs syndrome)   . Seasonal allergies   . Wears glasses     PAST SURGICAL HISTORY: Past Surgical History:  Procedure Laterality Date  . DILITATION & CURRETTAGE/HYSTROSCOPY WITH NOVASURE ABLATION N/A 03/04/2017   Procedure: DILATATION & CURETTAGE/HYSTEROSCOPY WITH NOVASURE ABLATION;  Surgeon: Fayrene FearingJames  Henderson Cloud, MD;  Location: [facility];  Service: Gynecology;  Laterality: N/A;  . LAPAROSCOPIC CHOLECYSTECTOMY  04/2008  . LAPAROSCOPIC TUBAL LIGATION Bilateral 02/2016  . TRANSTHORACIC ECHOCARDIOGRAM  03/09/2014   ef 55-60%/  trivial MR and TR  . WISDOM TOOTH EXTRACTION  age 61    FAMILY HISTORY: Family History  Problem Relation Age of Onset  . Anesthesia problems Other   . Depression Father     SOCIAL HISTORY: Social History   Social History  . Marital status: Separated    Spouse name: N/A  . Number of children: N/A  .  Years of education: N/A   Occupational History  . Not on file.   Social History Main Topics  . Smoking status: Former Smoker    Years: 10.00    Types: Cigarettes    Quit date: 11/16/2006  . Smokeless tobacco: Never Used  . Alcohol use 0.6 oz/week    1 Glasses of wine per week     Comment: rare  . Drug use: No  . Sexual activity: Yes    Birth control/ protection: Surgical   Other Topics Concern  . 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 Generalized: Well developed, morbidly obese female in no acute distress  Head: normocephalic and atraumatic,. Oropharynx benign  Neck: Supple,  Cardiac: Regular rate rhythm, no murmur  Musculoskeletal: No deformity   Neurological examination   Mentation: Alert oriented to time, place, history taking. Attention span and concentration appropriate. Recent and remote memory intact.  Follows all commands speech and language fluent.   Cranial nerve II-XII: .Pupils were equal round reactive to light extraocular movements were full, visual field were full on confrontational test. Facial sensation and strength were normal. hearing was intact to finger rubbing bilaterally. Uvula tongue midline. head turning and shoulder shrug were normal and symmetric.Tongue protrusion into cheek strength was normal. Motor: normal bulk and tone, full strength in the BUE, BLE, fine finger movements normal,  Sensory: normal and symmetric to light touch, upper and lower extremities  Coordination: finger-nose-finger, heel-to-shin bilaterally, no  dysmetria, no tremor Reflexes: 1+ upper lower and symmetric plantar responses were flexor bilaterally. Gait and Station: Rising up from seated position without assistance, normal stance,  moderate stride, good arm swing, smooth turning, able to perform tiptoe, and heel walking without difficulty. Tandem gait is steady    DIAGNOSTIC DATA (LABS, IMAGING, TESTING) - I reviewed patient records, labs, notes, testing and imaging myself where available.  Lab Results  Component Value Date   WBC 11.6 (H) 03/03/2017   HGB 10.6 (L) 03/03/2017   HCT 34.2 (L) 03/03/2017   MCV 73.1 (L) 03/03/2017   PLT 314 03/03/2017      Component Value Date/Time   Aguilar 138 03/03/2017 1159   K 4.2 03/03/2017 1159   CL 104 03/03/2017 1159   CO2 26 03/03/2017 1159   GLUCOSE 133 (H) 03/03/2017 1159  BUN 15 03/03/2017 1159   CREATININE 0.74 03/03/2017 1159   CALCIUM 9.0 03/03/2017 1159   PROT 7.6 03/03/2017 1159   ALBUMIN 3.8 03/03/2017 1159   AST 16 03/03/2017 1159   ALT 17 03/03/2017 1159   ALKPHOS 52 03/03/2017 1159   BILITOT 0.4 03/03/2017 1159   GFRNONAA >60 03/03/2017 1159   GFRAA >60 03/03/2017 1159   Lab Results  Component Value Date   CHOL 217 (A) 01/08/2016   HDL 31 (A) 01/08/2016   LDLCALC 158 01/08/2016   LDLDIRECT 160.6 01/04/2014   TRIG 138 01/08/2016   CHOLHDL 6 09/14/2014   Lab Results  Component Value Date   HGBA1C 5.6 01/08/2016    ASSESSMENT AND PLAN 37 y.o. year old female has a past medical history of 2 separate seizure events  while sleeping and one seizure event yesterday after she had adjusted her Keppra dose back in May.  She is now more agitated and irritable on the Keppra.. She is morbidly obese.  MRI of the brain in the past has been normal EEG has been normal most recent event occurred on 06/14/18. . 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 She was advised to lose weight and exercise.  In addition there were frequent periodic limb movements of sleep with associated sleep disruption. Ferritin level and iron studies were normal. She is currently on Keppra 750 in the am and 500mg  pm.  Patient increase the dose on her own and may2019 from 500mg  BID. The patient is a current patient of Dr. Pearlean Brownie  who is out of the office today . This note is sent to the work in doctor.     Continue Keppra at current dose for now Start Lamictal 25mg  daily for 2 weeks then 50 mg daily for 2 weeks then 100 mg daily for 2 weeks.  Call me at that point we will decrease Keppra and increase Lamictal.  Continue Requip for restless legs . Continue Prilosec 20mg  as directed Call for any seizure activity No driving for now Follow-up 3 months  Call for any seizure activity I spent 25 minutes in total face to face time with the patient more than 50% of which was spent counseling and coordination of care, reviewing test results reviewing medications and discussing and reviewing the diagnosis of seizure disorder restless legs and common seizure triggers. . She was encouraged exercise and lose weight for overall health and well-being.  She was made aware of the most significant side effects from Lamictal and that is why she needs slow titration [facility] [address] [place], [place] [address] [phone]

## 2018-06-15 ENCOUNTER — Encounter: Payer: Self-pay | Admitting: Nurse Practitioner

## 2018-06-15 ENCOUNTER — Ambulatory Visit (INDEPENDENT_AMBULATORY_CARE_PROVIDER_SITE_OTHER): Payer: 59 | Admitting: Nurse Practitioner

## 2018-06-15 VITALS — BP 107/73 | HR 62 | Ht 66.0 in | Wt 256.0 lb

## 2018-06-15 DIAGNOSIS — G2581 Restless legs syndrome: Secondary | ICD-10-CM | POA: Diagnosis not present

## 2018-06-15 DIAGNOSIS — R569 Unspecified convulsions: Secondary | ICD-10-CM | POA: Diagnosis not present

## 2018-06-15 MED ORDER — LAMOTRIGINE 25 MG PO TABS: ORAL_TABLET | ORAL | 2 refills | Status: DC

## 2018-06-15 NOTE — Patient Instructions (Addendum)
Continue Keppra at current dose for now Start Lamictal 25mg  daily for 2 weeks then 50 mg daily for 2 weeks then 100 mg daily for 2 weeks.  Call me at that point Continue Requip for restless legs . Continue Prilosec 20mg  as directed Call for any seizure activity No driving for now Follow-up 3 months

## 2018-06-23 ENCOUNTER — Encounter: Payer: Self-pay | Admitting: Emergency Medicine

## 2018-06-23 ENCOUNTER — Emergency Department
Admission: EM | Admit: 2018-06-23 | Discharge: 2018-06-23 | Disposition: A | Discharge status: Home / Self Care | Payer: 59 | Attending: Emergency Medicine | Admitting: Emergency Medicine

## 2018-06-23 ENCOUNTER — Other Ambulatory Visit: Payer: Self-pay

## 2018-06-23 ENCOUNTER — Emergency Department: Payer: 59

## 2018-06-23 DIAGNOSIS — Y929 Unspecified place or not applicable: Secondary | ICD-10-CM | POA: Insufficient documentation

## 2018-06-23 DIAGNOSIS — R2241 Localized swelling, mass and lump, right lower limb: Secondary | ICD-10-CM | POA: Insufficient documentation

## 2018-06-23 DIAGNOSIS — Z87891 Personal history of nicotine dependence: Secondary | ICD-10-CM | POA: Insufficient documentation

## 2018-06-23 DIAGNOSIS — Y9389 Activity, other specified: Secondary | ICD-10-CM | POA: Diagnosis not present

## 2018-06-23 DIAGNOSIS — W010XXA Fall on same level from slipping, tripping and stumbling without subsequent striking against object, initial encounter: Secondary | ICD-10-CM | POA: Insufficient documentation

## 2018-06-23 DIAGNOSIS — S93401A Sprain of unspecified ligament of right ankle, initial encounter: Secondary | ICD-10-CM | POA: Diagnosis not present

## 2018-06-23 DIAGNOSIS — Z79899 Other long term (current) drug therapy: Secondary | ICD-10-CM | POA: Diagnosis not present

## 2018-06-23 DIAGNOSIS — R569 Unspecified convulsions: Secondary | ICD-10-CM | POA: Insufficient documentation

## 2018-06-23 DIAGNOSIS — Y999 Unspecified external cause status: Secondary | ICD-10-CM | POA: Diagnosis not present

## 2018-06-23 DIAGNOSIS — S90811A Abrasion, right foot, initial encounter: Secondary | ICD-10-CM | POA: Diagnosis not present

## 2018-06-23 DIAGNOSIS — S8991XA Unspecified injury of right lower leg, initial encounter: Secondary | ICD-10-CM | POA: Diagnosis present

## 2018-06-23 LAB — CBC WITH DIFFERENTIAL/PLATELET
Basophils Absolute: 0.1 10*3/uL (ref 0–0.1)
Basophils Relative: 1 %
EOS PCT: 1 %
Eosinophils Absolute: 0.1 10*3/uL (ref 0–0.7)
HCT: 35.6 % (ref 35.0–47.0)
Hemoglobin: 11.6 g/dL — ABNORMAL LOW (ref 12.0–16.0)
LYMPHS ABS: 2.5 10*3/uL (ref 1.0–3.6)
LYMPHS PCT: 25 %
MCH: 24.7 pg — AB (ref 26.0–34.0)
MCHC: 32.6 g/dL (ref 32.0–36.0)
MCV: 75.5 fL — AB (ref 80.0–100.0)
MONO ABS: 0.5 10*3/uL (ref 0.2–0.9)
MONOS PCT: 5 %
Neutro Abs: 7 10*3/uL — ABNORMAL HIGH (ref 1.4–6.5)
Neutrophils Relative %: 68 %
PLATELETS: 275 10*3/uL (ref 150–440)
RBC: 4.71 MIL/uL (ref 3.80–5.20)
RDW: 15.8 % — AB (ref 11.5–14.5)
WBC: 10.2 10*3/uL (ref 3.6–11.0)

## 2018-06-23 LAB — BASIC METABOLIC PANEL
Anion gap: 6 (ref 5–15)
BUN: 15 mg/dL (ref 6–20)
CALCIUM: 8.5 mg/dL — AB (ref 8.9–10.3)
CO2: 24 mmol/L (ref 22–32)
CREATININE: 0.72 mg/dL (ref 0.44–1.00)
Chloride: 109 mmol/L (ref 98–111)
GFR calc Af Amer: >60 mL/min (ref >60)
GLUCOSE: 95 mg/dL (ref 70–99)
Potassium: 4.3 mmol/L (ref 3.5–5.1)
Sodium: 139 mmol/L (ref 135–145)

## 2018-06-23 MED ORDER — HYDROCODONE-ACETAMINOPHEN 5-325 MG PO TABS: 1.0000 | ORAL_TABLET | Freq: Four times a day (QID) | ORAL | 0 refills | Status: DC | PRN

## 2018-06-23 MED ORDER — HYDROCODONE-ACETAMINOPHEN 5-325 MG PO TABS
1.0000 | ORAL_TABLET | Freq: Once | ORAL | Status: AC
  Administered 2018-06-23: 1 via ORAL
  Filled 2018-06-23: qty 1

## 2018-06-23 MED ORDER — HYDROCODONE-ACETAMINOPHEN 5-325 MG PO TABS: 1.0000 | ORAL_TABLET | Freq: Four times a day (QID) | ORAL | 0 refills | Status: AC | PRN

## 2018-06-23 NOTE — ED Provider Notes (Signed)
[facility] Emergency Department Provider Note ____________________________________________  Time seen: Approximately 3:39 PM  I have reviewed the triage vital signs and the nursing notes.   HISTORY  Chief Complaint Foot Pain and Foot Swelling    HPI Lindsay Aguilar is a 37 y.o. female who presents to the emergency department for evaluation and treatment of right foot and ankle swelling.  Approximately 10 days ago, she had a seizure and fell causing abrasions to the right ankle and right hand. She went to urgent care and had an x-ray which was reported to be negative, but she is still having significant pain and swelling. She was placed on Clindamycin but believes the wounds on her foot are infected.    Past Medical History:  Diagnosis Date  . GERD (gastroesophageal reflux disease)   . History of CHF (congestive heart failure)    2006 during first preg. dx chf--  evaluated by cardologist (dr Melburn Poppernasher)-- resolved after birth (no issue w/ other preg's)  . Iron deficiency anemia   . Menometrorrhagia   . Nocturnal seizures [facility](HCC) neurologist-  dr sethi/ dr Anne Hahnwillis [facility])   last sz 06/14/18  . RLS (restless legs syndrome)   . Seasonal allergies   . Wears glasses     Patient Active Problem List   Diagnosis Date Noted  . Obesity 03/04/2017  . Restless legs syndrome 06/05/2016  . Somnolence, daytime 02/17/2016  . Convulsions/seizures (HCC) 08/01/2015  . Prediabetes 02/15/2014  . Dyslipidemia 02/15/2014  . Chronic headaches 01/04/2014  . Anxiety and depression 01/04/2014  . Mild intermittent asthma   . Anemia     Past Surgical History:  Procedure Laterality Date  . DILITATION & CURRETTAGE/HYSTROSCOPY WITH NOVASURE ABLATION N/A 03/04/2017   Procedure: DILATATION & CURETTAGE/HYSTEROSCOPY WITH NOVASURE ABLATION;  Surgeon: Harold HedgeJames Tomblin, MD;  [facility];  Service: Gynecology;  Laterality: N/A;  . LAPAROSCOPIC  CHOLECYSTECTOMY  04/2008  . LAPAROSCOPIC TUBAL LIGATION Bilateral 02/2016  . TRANSTHORACIC ECHOCARDIOGRAM  03/09/2014   ef 55-60%/  trivial MR and TR  . WISDOM TOOTH EXTRACTION  age 37    Prior to Admission medications   Medication Sig Start Date End Date Taking? Authorizing Provider  HYDROcodone-acetaminophen (NORCO/VICODIN) 5-325 MG tablet Take 1 tablet by mouth every 6 (six) hours as needed for up to 3 days for severe pain. 06/23/18 06/26/18  Joann Jorge, Kasandra Knudsenari B, FNP  lamoTRIgine (LAMICTAL) 25 MG tablet 1 p.o. daily for 2 weeks then 2 tablets daily for 2 weeks or 50 mg then increased to 100 mg daily for 2 weeks or 4 tablets. 06/15/18   Nilda RiggsMartin, Nancy Carolyn, NP  levETIRAcetam (KEPPRA) 500 MG tablet Take 1 tablet (500 mg total) by mouth 2 (two) times daily. 08/19/17   Nilda RiggsMartin, Nancy Carolyn, NP  omeprazole (PRILOSEC) 20 MG capsule Take 1 capsule (20 mg total) by mouth daily. 08/19/17   Nilda RiggsMartin, Nancy Carolyn, NP  rOPINIRole (REQUIP) 0.25 MG tablet TAKE 1 TABLET(0.25 MG) BY MOUTH AT BEDTIME 08/19/17   Nilda RiggsMartin, Nancy Carolyn, NP    Allergies Amoxicillin and Ephedrine  Family History  Problem Relation Age of Onset  . Anesthesia problems Other   . Depression Father     Social History Social History   Tobacco Use  . Smoking status: Former Smoker    Years: 10.00    Types: Cigarettes    Last attempt to quit: 11/16/2006    Years since quitting: 11.6  . Smokeless tobacco: Never Used  Substance Use Topics  .  Alcohol use: Yes    Alcohol/week: 1.0 standard drinks    Types: 1 Glasses of wine per week    Comment: rare  . Drug use: No    Review of Systems Constitutional: Negative for fever. Cardiovascular: Negative for chest pain. Respiratory: Negative for shortness of breath. Musculoskeletal: Positive for right foot and ankle pain and swelling. Skin: Positive for abrasions on right hand and foot.  Neurological: Negative for decrease in  sensation  ____________________________________________   PHYSICAL EXAM:  VITAL SIGNS: ED Triage Vitals  Enc Vitals Group     BP 06/23/18 1511 113/72     Pulse Rate 06/23/18 1511 70     Resp 06/23/18 1511 18     Temp 06/23/18 1511 98.3 F (36.8 C)     Temp Source 06/23/18 1511 Oral     SpO2 06/23/18 1511 99 %     Weight 06/23/18 1514 250 lb (113.4 kg)     Height 06/23/18 1514 5\' 6"  (1.676 m)     Head Circumference --      Peak Flow --      Pain Score 06/23/18 1514 5     Pain Loc --      Pain Edu? --      Excl. in GC? --     Constitutional: Alert and oriented. Well appearing and in no acute distress. Eyes: Conjunctivae are clear without discharge or drainage Head: Atraumatic Neck: Supple Respiratory: No cough. Respirations are even and unlabored. Musculoskeletal: Right ankle pain with full flexion and attempt to internally rotate. Neurologic: Motor and sensory function intact.  Skin: 2 abrasions on the lateral aspect of the right foot with surrounding erythema. No lymphangitis.  Psychiatric: Affect and behavior are appropriate.  ____________________________________________   LABS (all labs ordered are listed, but only abnormal results are displayed)  Labs Reviewed  CBC WITH DIFFERENTIAL/PLATELET - Abnormal; Notable for the following components:      Result Value   Hemoglobin 11.6 (*)    MCV 75.5 (*)    MCH 24.7 (*)    RDW 15.8 (*)    Neutro Abs 7.0 (*)    All other components within normal limits  BASIC METABOLIC PANEL - Abnormal; Notable for the following components:   Calcium 8.5 (*)    All other components within normal limits   ____________________________________________  RADIOLOGY  X-ray of the right ankle is negative for acute bony abnormality.  Ultrasound of the right lower extremity is negative for acute DVT. ____________________________________________   PROCEDURES  Procedures  ____________________________________________   INITIAL  IMPRESSION / ASSESSMENT AND PLAN / ED COURSE  Lindsay Aguilar is a 37 y.o. who presents to the emergency department for treatment and evaluation of right lower extremity pain.  Labs, x-ray, and ultrasound are all reassuring.  She will be treated for ankle sprain.  Ankle stirrup splint was applied by the ER tech.  She is to follow-up with podiatry for symptoms that are not improving over the week.  She was encouraged to rest, ice, and elevate her foot often throughout the day.  She is to return to the emergency department for symptoms of change or worsen if she is unable to schedule an appointment. Medications  HYDROcodone-acetaminophen (NORCO/VICODIN) 5-325 MG per tablet 1 tablet (1 tablet Oral Given 06/23/18 1908)    Pertinent labs & imaging results that were available during my care of the patient were reviewed by me and considered in my medical decision making (see chart for details).  _________________________________________  FINAL CLINICAL IMPRESSION(S) / ED DIAGNOSES  Final diagnoses:  Sprain of right ankle, unspecified ligament, initial encounter  Abrasion, foot, right, initial encounter    ED Discharge Orders         Ordered    HYDROcodone-acetaminophen (NORCO/VICODIN) 5-325 MG tablet  Every 6 hours PRN,   Status:  Discontinued     06/23/18 1843    HYDROcodone-acetaminophen (NORCO/VICODIN) 5-325 MG tablet  Every 6 hours PRN     06/23/18 1844           If controlled substance prescribed during this visit, 12 month history viewed on the [facility] prior to issuing an initial prescription for Schedule II or III opiod.    Chinita Pester, FNP 06/23/18 Joseph Pierini    Dionne Bucy, MD 06/26/18 1510

## 2018-06-23 NOTE — ED Triage Notes (Signed)
Pt fell during seizure about 10 days ago, states abrasions to right foot are not healing well, has been on antibiotics for the past few days, foot painful and swollen, walked with limp to triage.

## 2018-06-23 NOTE — ED Notes (Addendum)
See triage note  Presents with pain and swelling to right foot s/p sz several days ago   States foot is having constant pain  Abrasion noted not healing well  Area is draining pus  Has been on antibiotics   States hand abrasion is better but foot is not healing

## 2018-07-26 ENCOUNTER — Telehealth: Payer: Self-pay | Admitting: Nurse Practitioner

## 2018-07-26 NOTE — Telephone Encounter (Signed)
Increase the Lamictal dose to 50 mg in the morning 100 mg at night.  For 2 weeks then increase to 100 mg twice daily.(she has the 25mg  tabs) Stop the 500 mg dose of Keppra at night but continue 1-1/2 tablets in the morning for 2 weeks then discontinue altogether.  If you need refills these can be refilled.  Please call the patient

## 2018-07-26 NOTE — Telephone Encounter (Signed)
Spoke with patient and advised she continue with Lamictal 100 mg daily. She asked if the dose would be increased as NP discussed in her office visit. This RN reviewed that office note. It stated her Keppra would be further decreased and Lamictal increased after she had been on lamictal 100 mg daily x 2 weeks. This RN advised will discuss with NP and call her back. Her pharmacy is [facility], [address]

## 2018-07-26 NOTE — Telephone Encounter (Signed)
Pt requesting a call to discuss lamoTRIgine (LAMICTAL) 25 MG tablet stating today was her last dose. Pt unsure on how to move forward with dosing.

## 2018-07-26 NOTE — Telephone Encounter (Signed)
Spoke with patient and advised she write down instructions this RN has for her. She stated she was ready. Gave her NP's exact message regarding Lamictal's increasing schedule and Keppra's decreasing schedule.  Advised she should have enough Keppra, and she has 2 refills on lamictal. This RN advised she call in two weeks to update on how she is doing. Advised her Lamictal can be refilled at that time. She verbalized understanding, appreciation of call back.

## 2018-07-27 ENCOUNTER — Telehealth: Payer: Self-pay | Admitting: *Deleted

## 2018-07-27 NOTE — Telephone Encounter (Signed)
Pt [employer] form on [employer].

## 2018-07-28 NOTE — Telephone Encounter (Signed)
[employer] papers ready for [employer] NP's review, completion, signature. She returns to the office on 08/01/18. Papers due 08/04/18.

## 2018-08-01 DIAGNOSIS — Z0289 Encounter for other administrative examinations: Secondary | ICD-10-CM

## 2018-08-01 NOTE — Telephone Encounter (Signed)
[employer] papers completed, signed by NP, sent to MR for processing.

## 2018-08-02 NOTE — Telephone Encounter (Signed)
F[place] paperwork faxed to [employer] at [phone].

## 2018-08-04 NOTE — Telephone Encounter (Signed)
Received call from [facility] with [employer] stating that on patient's FMLA questions #7, #8 must either have a specific time of frequency and duration given or state "as needed". This RN advised her that seizures cannot be predicted. Selena stated she could accept a verbal of "as needed" for #7, #8 if provider agrees. This RN advised her the NP will agree, and gave Selena a verbal to correct FMLA papers. She verbalized understanding, appreciation.

## 2018-08-10 ENCOUNTER — Telehealth: Payer: Self-pay

## 2018-08-10 MED ORDER — LAMOTRIGINE 100 MG PO TABS: 100.0000 mg | ORAL_TABLET | Freq: Two times a day (BID) | ORAL | 6 refills | Status: DC

## 2018-08-10 NOTE — Telephone Encounter (Signed)
Received a fax from [facility] needing a new script for Lamictal. I spoke with Lindsay Aguilar and she stated that she feels fine since the changes have been made. She mentioned that today is her first day not taking the Keppra. She stated that she has enough Lamictal to last her through this week.

## 2018-08-10 NOTE — Telephone Encounter (Signed)
Called to pharmacy 

## 2018-09-01 ENCOUNTER — Other Ambulatory Visit: Payer: Self-pay | Admitting: Nurse Practitioner

## 2018-09-01 DIAGNOSIS — K219 Gastro-esophageal reflux disease without esophagitis: Secondary | ICD-10-CM

## 2018-09-19 NOTE — Progress Notes (Signed)
[facility]  PATIENT: Lindsay Aguilar DOB: 06-Sep-1981   REASON FOR VISIT:  seizure disorder with recent seizure , restless leg syndrome, acid reflux from Keppra  HISTORY FROM: Patient    HISTORY OF PRESENT ILLNESS: HISTORY PSMs Lindsay Aguilar is a 37 year old pleasant Caucasian lady who 3 days ago on Monday night had an episode of witnessed seizure in sleep. She is unable to describe the episode and the husband was an eyewitness.. The patient and husband went to bed at the usual time. Her daughter who sleeps with them woke up and shouted.at about 2 AM which woke up the husband who noticed that his wife had tonic posturing of her upper extremities with tremulousness. Her mouth was closed. She had tongue bite. She had incontinence of urine. The patient stopped shaking after 3-5 minutes. She appeared to be postictal with transient disorientation and confusion. She recalls the EMS arriving to the house.and her disorientation started clearing by the time she reached the hospital. She had noncontrast CT scan of the head done on 08/16/2015 which I personally reviewed and is unremarkable except for evidence of incidental sinusitis. Basic metabolic panel labs and CBC were unremarkable. Patient had started a cold medication on that day which include atenolol, guaifenesin and phenylephrine. She has no known prior history of seizures, significant head injury with loss of consciousness, childhood meningitis, neurological illness. She denies drinking or recreational abuse drugs or drinking significant amounts of alcohol. There is no family history of epilepsy either. She feels back to baseline and has no complaints today. She denies sleep deprivation, significant stress, irregular eating and sleeping habits. She does not drink excessive amounts of caffeine Update 10/22/2015 ; PSShe returns for follow-up after initial consultation visit 2 and half months ago. She continues to do well without  recurrent seizure episodes. She had an MRI scan of the brain done on 08/19/15 which I personally reviewed and is normal without any structural abnormalities in the brain. EEG done on 08/22/15 is normal without definite epileptiform activity. Patient states she is not had any further seizure-like episodes or any other neurological complaints. She is keen to start driving again soon UPDATE 04/03/2017CM Lindsay Aguilar, 37 year old female returns for follow-up. She has a history of 2 seizure events since September both occurred while sleeping. Her most recent seizure on 02/12/16 occurred while she was napping in the afternoon with her daughter. She had some generalized shaking of her extremities that lasted approximately 3-4 minutes with loss of consciousness and incontinence of urine. She is markedly obese and may have sleep apnea. After her most recent event she needs anticonvulsant medications. MRI of the brain in the past has been normal. EEG has been normal UPDATE 07/21/2017CM. Lindsay. Lindsay Aguilar 37 year old female returns for follow-up. She was last seen in April and had 2 previous seizure events. She was placed on Keppra at her last visit and asked to get a sleep study . 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 She was advised to lose weight and exercise. In addition there were frequent periodic limb movements of sleep with associated sleep disruption. Will check ferritin level and iron studies. She has had a low hemoglobin in the past. She returns for reevaluation. She has not had further seizure events. She was made aware she still cannot drive for 3 months since her last seizure was 02/12/2016. UPDATE 10/04/2018CM Lindsay Aguilar, 37 year old female returns for follow-up with history of seizure  disorder currently on Keppra with side effects of acid reflux. She has been placed on Prilosec. Last seizure event was in April 2017.  She has a history of restless legs confirmed by sleep study .Her iron studies and ferritin have been normal. She remains overweight as been encouraged to lose weight and exercise. She is currently going through a divorce which she reportedly claims is very stressful for her particularly under the circumstances which it occurred. She caught  her husband watching her sister take a shower. She returns for reevaluation. She had an endometrial ablation in April 2018 She is wanting to be placed on Valium for her anxiety however she was advised to follow-up with her primary care for that . Valium is a controlled substance. UPDATE 7/31/2019CM Lindsay Aguilar, 37 year old female returns for follow-up with history of seizure disorder currently on Keppra and has had side effects of acid reflux in the past.  She reports in May she felt like she was going to have a seizure so she increased her dose on her own without calling us.  She increased it to 1-1/2 tabs in the morning and 1 tab at night of the 500 mg.  She had a seizure last night getting out of the car.  Her significant other helped her to the ground it just lasted moments no apparent injury except some scrapes on her feet and hands.  Significant other reports that since she increased the dose back in May she has been more irritable and agitated.  Prior to this last seizure was in April 2017.  She returns for reevaluation She also has a Lindsay Aguilar, 37 year old female returns for follow-up with history of seizure disorder.  Her last seizure was 06/14/2018.  She was on Keppra at the time and she had increased her dose on her own and became much more irritable and agitated.  She also had acid reflux on Keppra.  After her last visit she made a slow switch to Lamictal and titrated off of Keppra due to side effects she returns today feeling much better.  She also has history of restless legs and is on Requip.  She was advised to stop her Prilosec now for acid reflux from the  Keppra.  She returns for reevaluation  REVIEW OF SYSTEMS: Full 14 system review of systems performed and notable only for those listed, all others are neg:  Constitutional: neg  Cardiovascular: neg Ear/Nose/Throat: neg  Skin: neg Eyes: neg Respiratory: neg Gastroitestinal: neg  Hematology/Lymphatic: Easy bruising anemia Endocrine: neg Musculoskeletal:neg Allergy/Immunology: neg Neurological:  seizure disorder last seizure 06/14/2018 psychiatric: neg Sleep : Restless legs   ALLERGIES: Allergies  Allergen Reactions  . Amoxicillin Other (See Comments)    Yeast infection  . Ephedrine Other (See Comments)    Avoids due to possible cause of seizure    HOME MEDICATIONS: Outpatient Medications Prior to Visit  Medication Sig Dispense Refill  . diazepam (VALIUM) 5 MG tablet Take 5 mg by mouth as needed.  0  . lamoTRIgine (LAMICTAL) 100 MG tablet Take 1 tablet (100 mg total) by mouth 2 (two) times daily. 60 tablet 6  . omeprazole (PRILOSEC) 20 MG capsule TAKE 1 CAPSULE BY MOUTH EVERY DAY 30 capsule 11  . rOPINIRole (REQUIP) 0.25 MG tablet TAKE 1 TABLET(0.25 MG) BY MOUTH AT BEDTIME 30 tablet 11  . levETIRAcetam (KEPPRA) 500 MG tablet Take 1 tablet (500 mg total) by mouth 2 (two) times daily. (Patient taking differently: Take 500 mg by mouth 2 (two) times  daily. Stop the 500mg  dose at night continue 1.5 tabs in the am for 2 weeks then discontinue the am dose altogether) 180 tablet 3   No facility-administered medications prior to visit.     PAST MEDICAL HISTORY: Past Medical History:  Diagnosis Date  . GERD (gastroesophageal reflux disease)   . History of CHF (congestive heart failure)    2006 during first preg. dx chf--  evaluated by cardologist (dr Melburn Popper)-- resolved after birth (no issue w/ other preg's)  . Iron deficiency anemia   . Menometrorrhagia   . Nocturnal seizures [facility]) neurologist-  dr sethi/ dr Anne Hahn [facility] neurology [facility])   last sz 06/14/18  . RLS (restless legs  syndrome)   . Seasonal allergies   . Wears glasses     PAST SURGICAL HISTORY: Past Surgical History:  Procedure Laterality Date  . DILITATION & CURRETTAGE/HYSTROSCOPY WITH NOVASURE ABLATION N/A 03/04/2017   Procedure: DILATATION & CURETTAGE/HYSTEROSCOPY WITH NOVASURE ABLATION;  Surgeon: Harold Hedge, MD;  Location: [facility];  Service: Gynecology;  Laterality: N/A;  . LAPAROSCOPIC CHOLECYSTECTOMY  04/2008  . LAPAROSCOPIC TUBAL LIGATION Bilateral 02/2016  . TRANSTHORACIC ECHOCARDIOGRAM  03/09/2014   ef 55-60%/  trivial MR and TR  . WISDOM TOOTH EXTRACTION  age 81    FAMILY HISTORY: Family History  Problem Relation Age of Onset  . Anesthesia problems Other   . Depression Father     SOCIAL HISTORY: Social History   Social History  . Marital status: Separated    Spouse name: N/A  . Number of children: N/A  . Years of education: N/A   Occupational History  . Not on file.   Social History Main Topics  . Smoking status: Former Smoker    Years: 10.00    Types: Cigarettes    Quit date: 11/16/2006  . Smokeless tobacco: Never Used  . Alcohol use 0.6 oz/week    1 Glasses of wine per week     Comment: rare  . Drug use: No  . Sexual activity: Yes    Birth control/ protection: Surgical   Other Topics Concern  . 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 Generalized: Well developed, morbidly obese female in no acute distress  Head: normocephalic and atraumatic,. Oropharynx benign  Neck: Supple,  Cardiac: Regular rate rhythm, no murmur  Musculoskeletal:  No deformity   Neurological examination   Mentation: Alert oriented to time, place, history taking. Attention span and concentration appropriate. Recent and remote memory intact.  Follows all commands speech and language fluent.   Cranial nerve II-XII: .Pupils were equal round reactive to light extraocular movements were full, visual field were full on confrontational test. Facial sensation and strength were normal. hearing was intact to finger rubbing bilaterally. Uvula tongue midline. head turning and shoulder shrug were normal and symmetric.Tongue protrusion into cheek strength was normal. Motor: normal bulk and tone, full strength in the BUE, BLE, fine finger movements normal,  Sensory:  normal and symmetric to light touch, upper and lower extremities  Coordination: finger-nose-finger, heel-to-shin bilaterally, no dysmetria, no tremor Reflexes: 1+ upper lower and symmetric plantar responses were flexor bilaterally. Gait and Station: Rising up from seated position without assistance, normal stance,  moderate stride, good arm swing, smooth turning, able to perform tiptoe, and heel walking without difficulty. Tandem gait is steady    DIAGNOSTIC DATA (LABS, IMAGING, TESTING) - I reviewed patient records, labs, notes, testing and imaging myself where available.  Lab Results  Component Value Date   WBC 10.2 06/23/2018   HGB 11.6 (L) 06/23/2018   HCT 35.6 06/23/2018   MCV 75.5 (L) 06/23/2018   PLT 275 06/23/2018      Component Value Date/Time   Aguilar 139 06/23/2018 1541   K 4.3 06/23/2018 1541   CL 109 06/23/2018 1541   CO2 24 06/23/2018 1541   GLUCOSE 95 06/23/2018 1541   BUN 15 06/23/2018 1541   CREATININE 0.72 06/23/2018 1541   CALCIUM 8.5 (L) 06/23/2018 1541   PROT 7.6 03/03/2017 1159   ALBUMIN 3.8 03/03/2017 1159   AST 16 03/03/2017 1159   ALT 17 03/03/2017 1159   ALKPHOS 52 03/03/2017 1159   BILITOT 0.4 03/03/2017 1159   GFRNONAA >60 06/23/2018 1541   GFRAA >60 06/23/2018  1541   Lab Results  Component Value Date   CHOL 217 (A) 01/08/2016   HDL 31 (A) 01/08/2016   LDLCALC 158 01/08/2016   LDLDIRECT 160.6 01/04/2014   TRIG 138 01/08/2016   CHOLHDL 6 09/14/2014   Lab Results  Component Value Date   HGBA1C 5.6 01/08/2016    ASSESSMENT AND PLAN 37 y.o. year old female has a past medical history of 2 separate seizure events  while sleeping and one seizure event yesterday after she had adjusted her Keppra dose back in May.  She is now more agitated and irritable on the Keppra.. She is morbidly obese.  MRI of the brain in the past has been normal EEG has been normal most recent event occurred on 06/14/18. . 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 She was advised to lose weight and exercise. In addition there were frequent periodic limb movements of sleep with associated sleep disruption. Ferritin level and iron studies were normal.  She was successfully titrated off of Keppra and titrated on Lamictal after her last visit.  She is currently on 100 mg twice daily.        Continue  Lamictal 100 mg twice daily.  Will check Lamictal level if therapeutic may return to driving Continue Requip for restless legs . Call for any seizure activity Follow-up 6 months  Nilda Riggs, [facility], [facility], APRN [facility] [address], [address] [place], [place] [address] [phone]

## 2018-09-20 ENCOUNTER — Encounter: Payer: Self-pay | Admitting: Nurse Practitioner

## 2018-09-20 ENCOUNTER — Ambulatory Visit (INDEPENDENT_AMBULATORY_CARE_PROVIDER_SITE_OTHER): Payer: 59 | Admitting: Nurse Practitioner

## 2018-09-20 VITALS — BP 120/78 | HR 77 | Wt 276.0 lb

## 2018-09-20 DIAGNOSIS — R569 Unspecified convulsions: Secondary | ICD-10-CM | POA: Diagnosis not present

## 2018-09-20 DIAGNOSIS — G2581 Restless legs syndrome: Secondary | ICD-10-CM

## 2018-09-20 NOTE — Patient Instructions (Addendum)
Continue  Lamictal 100 mg twice daily.  Will check Lamictal level  Continue Requip for restless legs . Call for any seizure activity Follow-up 6 months

## 2018-09-22 ENCOUNTER — Encounter: Payer: Self-pay | Admitting: *Deleted

## 2018-09-22 LAB — LAMOTRIGINE LEVEL: Lamotrigine Lvl: 6.4 ug/mL (ref 2.0–20.0)

## 2018-11-02 ENCOUNTER — Telehealth: Payer: Self-pay | Admitting: Nurse Practitioner

## 2018-11-02 NOTE — Telephone Encounter (Signed)
Pt called stating she is having a hysterectomy on January 6th and is trying to figure out her best decision if she should take her ovaries out as well. Stating she is wanting NPs input due to hearing that an increase of estrogen can lead to seizures please advise.

## 2018-11-02 NOTE — Telephone Encounter (Signed)
That is out of my scope of practice. I would follow the advice of the surgeon.

## 2018-11-03 NOTE — Telephone Encounter (Signed)
Spoke to pt and relayed that per CM/NP that is out of her scope of practice, and would follow advice of surgeon.  She was asking if heard before.  Dr. Pearlean BrownieSethi is out of office.

## 2018-11-17 NOTE — Telephone Encounter (Signed)
Spoke to pt, relayed spoke to one of MD in office, would consult with surgeon and if they have question to call us, otherwise not aware of correlation of estrogen and sz.  She is going to go ahead and have both hysterectomy and her ovaries removed.  She will go thru menopause and not use any HRT.  The thanked [place] for call.

## 2018-11-21 ENCOUNTER — Other Ambulatory Visit: Payer: Self-pay | Admitting: Obstetrics and Gynecology

## 2018-11-21 HISTORY — PX: ABDOMINAL HYSTERECTOMY: SHX81

## 2019-01-04 ENCOUNTER — Ambulatory Visit (INDEPENDENT_AMBULATORY_CARE_PROVIDER_SITE_OTHER): Payer: Commercial Managed Care - PPO | Admitting: Physician Assistant

## 2019-01-04 ENCOUNTER — Encounter: Payer: Self-pay | Admitting: Physician Assistant

## 2019-01-04 ENCOUNTER — Other Ambulatory Visit: Payer: Self-pay

## 2019-01-04 VITALS — BP 118/88 | HR 92 | Temp 98.5°F | Resp 16 | Ht 67.0 in | Wt 281.0 lb

## 2019-01-04 DIAGNOSIS — F419 Anxiety disorder, unspecified: Secondary | ICD-10-CM

## 2019-01-04 DIAGNOSIS — F329 Major depressive disorder, single episode, unspecified: Secondary | ICD-10-CM | POA: Diagnosis not present

## 2019-01-04 DIAGNOSIS — B9689 Other specified bacterial agents as the cause of diseases classified elsewhere: Secondary | ICD-10-CM | POA: Diagnosis not present

## 2019-01-04 DIAGNOSIS — J208 Acute bronchitis due to other specified organisms: Secondary | ICD-10-CM | POA: Diagnosis not present

## 2019-01-04 MED ORDER — BENZONATATE 100 MG PO CAPS: 100.0000 mg | ORAL_CAPSULE | Freq: Three times a day (TID) | ORAL | 0 refills | Status: DC | PRN

## 2019-01-04 MED ORDER — DOXYCYCLINE HYCLATE 100 MG PO CAPS: 100.0000 mg | ORAL_CAPSULE | Freq: Two times a day (BID) | ORAL | 0 refills | Status: DC

## 2019-01-04 MED ORDER — DIAZEPAM 5 MG PO TABS: 5.0000 mg | ORAL_TABLET | ORAL | 0 refills | Status: DC | PRN

## 2019-01-04 NOTE — Progress Notes (Signed)
Patient presents to clinic today c/o 1 week of chest congestion and cough that is productive of thick green sputum. Notes some sinus pressure but denies ear pain, tooth pain.  Notes fever yesterday with Tmax of 100.5. Notes history of bronchitis, usually having once per year. Was diagnosed with costochondritis at her Orthopedist Monday. Is currently on a regimen of prednisone taper, taking as directed. Has also taken Tylenol and Ibuprofen. Has albuterol inhaler but has not had to use.   Past Medical History:  Diagnosis Date  . GERD (gastroesophageal reflux disease)   . History of CHF (congestive heart failure)    2006 during first preg. dx chf--  evaluated by cardologist (dr Melburn Popper)-- resolved after birth (no issue w/ other preg's)  . Iron deficiency anemia   . Menometrorrhagia   . Nocturnal seizures [facility]) neurologist-  dr sethi/ dr Anne Hahn [facility] neurology ass[facility])   last sz 06/14/18  . RLS (restless legs syndrome)   . Seasonal allergies   . Wears glasses     Current Outpatient Medications on File Prior to Visit  Medication Sig Dispense Refill  . diazepam (VALIUM) 5 MG tablet Take 5 mg by mouth as needed.  0  . lamoTRIgine (LAMICTAL) 100 MG tablet Take 1 tablet (100 mg total) by mouth 2 (two) times daily. 60 tablet 6  . rOPINIRole (REQUIP) 0.25 MG tablet TAKE 1 TABLET(0.25 MG) BY MOUTH AT BEDTIME 30 tablet 11   No current facility-administered medications on file prior to visit.     Allergies  Allergen Reactions  . Amoxicillin Other (See Comments)    Yeast infection  . Ephedrine Other (See Comments)    Avoids due to possible cause of seizure    Family History  Problem Relation Age of Onset  . Anesthesia problems Other   . Depression Father     Social History   Socioeconomic History  . Marital status: Married    Spouse name: Not on file  . Number of children: Not on file  . Years of education: Not on file  . Highest education level: Not on file  Occupational History  .  Not on file  Social Needs  . Financial resource strain: Not on file  . Food insecurity:    Worry: Not on file    Inability: Not on file  . Transportation needs:    Medical: Not on file    Non-medical: Not on file  Tobacco Use  . Smoking status: Former Smoker    Years: 10.00    Types: Cigarettes    Last attempt to quit: 11/16/2006    Years since quitting: 12.1  . Smokeless tobacco: Never Used  Substance and Sexual Activity  . Alcohol use: Yes    Alcohol/week: 1.0 standard drinks    Types: 1 Glasses of wine per week    Comment: rare  . Drug use: No  . Sexual activity: Yes    Birth control/protection: Surgical  Lifestyle  . Physical activity:    Days per week: Not on file    Minutes per session: Not on file  . Stress: Not on file  Relationships  . Social connections:    Talks on phone: Not on file    Gets together: Not on file    Attends religious service: Not on file    Active member of club or organization: Not on file    Attends meetings of clubs or organizations: Not on file    Relationship status: Not on file  Other  Topics Concern  . 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  All other ROS are negative.  BP 118/88   Pulse 92   Temp 98.5 F (36.9 C) (Oral)   Resp 16   Ht 5\' 7"  (1.702 m)   Wt 281 lb (127.5 kg)   LMP 02/12/2017 (Exact Date)   SpO2 98%   BMI 44.01 kg/m   Physical Exam Vitals signs reviewed.  Constitutional:      Appearance: Normal appearance.  HENT:     Head: Normocephalic and atraumatic.     Right Ear: Tympanic membrane normal.     Left Ear: Tympanic membrane normal.     Nose: Nose normal.     Mouth/Throat:     Mouth: Mucous membranes  are moist.  Eyes:     Conjunctiva/sclera: Conjunctivae normal.     Pupils: Pupils are equal, round, and reactive to light.  Neck:     Musculoskeletal: Neck supple.  Cardiovascular:     Rate and Rhythm: Normal rate and regular rhythm.     Heart sounds: Normal heart sounds.  Pulmonary:     Effort: Pulmonary effort is normal.     Breath sounds: Normal breath sounds.  Neurological:     General: No focal deficit present.     Mental Status: She is alert and oriented to person, place, and time.  Psychiatric:        Mood and Affect: Mood normal.    Assessment/Plan: 1. Acute bacterial bronchitis Rx Doxycycline.  Increase fluids.  Rest.  Saline nasal spray.  Probiotic.  Mucinex as directed.  Humidifier in bedroom. Tessalon per orders.  Call or return to clinic if symptoms are not improving.  - doxycycline (VIBRAMYCIN) 100 MG capsule; Take 1 capsule (100 mg total) by mouth 2 (two) times daily.  Dispense: 14 capsule; Refill: 0 - benzonatate (TESSALON) 100 MG capsule; Take 1 capsule (100 mg total) by mouth 3 (three) times daily as needed for cough.  Dispense: 30 capsule; Refill: 0  2. Anxiety and depression Will refill Valium today -- 10 tablet supply only. No further refilled without true establish care appointment.    Piedad Climes, PA-C

## 2019-01-04 NOTE — Patient Instructions (Signed)
Take antibiotic (Doxycycline) as directed.  Increase fluids.  Get plenty of rest. Use Mucinex for congestion. Tessalon per orders. Take a daily probiotic (I recommend Align or Culturelle, but even Activia Yogurt may be beneficial).  A humidifier placed in the bedroom may offer some relief for a dry, scratchy throat of nasal irritation.  Read information below on acute bronchitis. Please call or return to clinic if symptoms are not improving.  Call to schedule an appointment for a complete physical so we can truly establish.  Acute Bronchitis Bronchitis is when the airways that extend from the windpipe into the lungs get red, puffy, and painful (inflamed). Bronchitis often causes thick spit (mucus) to develop. This leads to a cough. A cough is the most common symptom of bronchitis. In acute bronchitis, the condition usually begins suddenly and goes away over time (usually in 2 weeks). Smoking, allergies, and asthma can make bronchitis worse. Repeated episodes of bronchitis may cause more lung problems.  HOME CARE  Rest.  Drink enough fluids to keep your pee (urine) clear or pale yellow (unless you need to limit fluids as told by your doctor).  Only take over-the-counter or prescription medicines as told by your doctor.  Avoid smoking and secondhand smoke. These can make bronchitis worse. If you are a smoker, think about using nicotine gum or skin patches. Quitting smoking will help your lungs heal faster.  Reduce the chance of getting bronchitis again by:  Washing your hands often.  Avoiding people with cold symptoms.  Trying not to touch your hands to your mouth, nose, or eyes.  Follow up with your doctor as told.  GET HELP IF: Your symptoms do not improve after 1 week of treatment. Symptoms include:  Cough.  Fever.  Coughing up thick spit.  Body aches.  Chest congestion.  Chills.  Shortness of breath.  Sore throat.  GET HELP RIGHT AWAY IF:   You have an increased  fever.  You have chills.  You have severe shortness of breath.  You have bloody thick spit (sputum).  You throw up (vomit) often.  You lose too much body fluid (dehydration).  You have a severe headache.  You faint.  MAKE SURE YOU:   Understand these instructions.  Will watch your condition.  Will get help right away if you are not doing well or get worse. Document Released: 04/20/2008 Document Revised: 07/05/2013 Document Reviewed: 04/25/2013 [facility] Patient Information 2015 [place], [place]. This information is not intended to replace advice given to you by your health care provider. Make sure you discuss any questions you have with your health care provider.

## 2019-01-05 ENCOUNTER — Encounter: Payer: Self-pay | Admitting: Physician Assistant

## 2019-01-10 ENCOUNTER — Encounter: Payer: Self-pay | Admitting: Physician Assistant

## 2019-01-10 ENCOUNTER — Ambulatory Visit (INDEPENDENT_AMBULATORY_CARE_PROVIDER_SITE_OTHER): Payer: Commercial Managed Care - PPO | Admitting: Physician Assistant

## 2019-01-10 ENCOUNTER — Other Ambulatory Visit: Payer: Self-pay

## 2019-01-10 VITALS — BP 110/80 | HR 95 | Temp 98.3°F | Resp 16 | Ht 66.0 in | Wt 286.0 lb

## 2019-01-10 DIAGNOSIS — M791 Myalgia, unspecified site: Secondary | ICD-10-CM

## 2019-01-10 LAB — CBC WITH DIFFERENTIAL/PLATELET
BASOS PCT: 0.5 % (ref 0.0–3.0)
Basophils Absolute: 0 10*3/uL (ref 0.0–0.1)
EOS PCT: 2.8 % (ref 0.0–5.0)
Eosinophils Absolute: 0.3 10*3/uL (ref 0.0–0.7)
HEMATOCRIT: 36.4 % (ref 36.0–46.0)
HEMOGLOBIN: 11.7 g/dL — AB (ref 12.0–15.0)
LYMPHS PCT: 28.3 % (ref 12.0–46.0)
Lymphs Abs: 2.9 10*3/uL (ref 0.7–4.0)
MCHC: 32.1 g/dL (ref 30.0–36.0)
MCV: 73.6 fl — AB (ref 78.0–100.0)
Monocytes Absolute: 0.4 10*3/uL (ref 0.1–1.0)
Monocytes Relative: 3.7 % (ref 3.0–12.0)
Neutro Abs: 6.6 10*3/uL (ref 1.4–7.7)
Neutrophils Relative %: 64.7 % (ref 43.0–77.0)
PLATELETS: 306 10*3/uL (ref 150.0–400.0)
RBC: 4.94 Mil/uL (ref 3.87–5.11)
RDW: 15 % (ref 11.5–15.5)
WBC: 10.2 10*3/uL (ref 4.0–10.5)

## 2019-01-10 LAB — CK: Total CK: 31 U/L (ref 7–177)

## 2019-01-10 MED ORDER — MELOXICAM 15 MG PO TABS: 15.0000 mg | ORAL_TABLET | Freq: Every day | ORAL | 0 refills | Status: DC

## 2019-01-10 NOTE — Progress Notes (Signed)
Patient presents to clinic today c/o 1 days of muscle aches and tender lymph nodes of neck and shoulders. Patient seen on 01/04/2019 and diagnosed with acute bacterial bronchitis, placed on regimen of Doxycycline. Notes significant improvement in URI symptoms with only mild residual cough left. Denies fever, chills or sore throat. Denies heavy lifting or overexertion.   Past Medical History:  Diagnosis Date  . GERD (gastroesophageal reflux disease)   . History of CHF (congestive heart failure)    2006 during first preg. dx chf--  evaluated by cardologist (dr Melburn Popper)-- resolved after birth (no issue w/ other preg's)  . Iron deficiency anemia   . Menometrorrhagia   . Nocturnal seizures [facility]) neurologist-  dr sethi/ dr [facility] neurology assoc)   last sz 06/14/18  . RLS (restless legs syndrome)   . Seasonal allergies   . Wears glasses     Current Outpatient Medications on File Prior to Visit  Medication Sig Dispense Refill  . benzonatate (TESSALON) 100 MG capsule Take 1 capsule (100 mg total) by mouth 3 (three) times daily as needed for cough. 30 capsule 0  . diazepam (VALIUM) 5 MG tablet Take 1 tablet (5 mg total) by mouth as needed. 10 tablet 0  . lamoTRIgine (LAMICTAL) 100 MG tablet Take 1 tablet (100 mg total) by mouth 2 (two) times daily. 60 tablet 6  . rOPINIRole (REQUIP) 0.25 MG tablet TAKE 1 TABLET(0.25 MG) BY MOUTH AT BEDTIME 30 tablet 11   No current facility-administered medications on file prior to visit.     Allergies  Allergen Reactions  . Amoxicillin Other (See Comments)    Yeast infection  . Ephedrine Other (See Comments)    Avoids due to possible cause of seizure    Family History  Problem Relation Age of Onset  . Anesthesia problems Other   . Depression Father     Social History   Socioeconomic History  . Marital status: Married    Spouse name: Not on file  . Number of children: Not on file  . Years of education: Not on file  . Highest education  level: Not on file  Occupational History  . Not on file  Social Needs  . Financial resource strain: Not on file  . Food insecurity:    Worry: Not on file    Inability: Not on file  . Transportation needs:    Medical: Not on file    Non-medical: Not on file  Tobacco Use  . Smoking status: Former Smoker    Years: 10.00    Types: Cigarettes    Last attempt to quit: 11/16/2006    Years since quitting: 12.1  . Smokeless tobacco: Never Used  Substance and Sexual Activity  . Alcohol use: Yes    Alcohol/week: 1.0 standard drinks    Types: 1 Glasses of wine per week    Comment: rare  . Drug use: No  . Sexual activity: Yes    Birth control/protection: Surgical  Lifestyle  . Physical activity:    Days per week: Not on file    Minutes per session: Not on file  . Stress: Not on file  Relationships  . Social connections:    Talks on phone: Not on file    Gets together: Not on file    Attends religious service: Not on file    Active member of club or organization: Not on file    Attends meetings of clubs or organizations: Not on file  Relationship status: Not on file  Other Topics Concern  . 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  All other ROS are negative.  BP 110/80   Pulse 95   Temp 98.3 F (36.8 C) (Oral)   Resp 16   Ht 5\' 6"  (1.676 m)   Wt 286 lb (129.7 kg)   LMP 02/12/2017 (Exact Date)   SpO2 98%   BMI 46.16 kg/m   Physical Exam Vitals signs reviewed.  Constitutional:      Appearance: Normal appearance.  HENT:     Head: Normocephalic and atraumatic.     Right Ear: Tympanic membrane and ear canal normal.     Left Ear: Tympanic membrane and ear canal  normal.     Nose: No congestion or rhinorrhea.     Mouth/Throat:     Mouth: Mucous membranes are moist.     Pharynx: Oropharynx is clear. No oropharyngeal exudate or posterior oropharyngeal erythema.  Eyes:     Conjunctiva/sclera: Conjunctivae normal.  Neck:     Musculoskeletal: Muscular tenderness (posterior. No thyroid tenderness) present.  Cardiovascular:     Rate and Rhythm: Normal rate and regular rhythm.     Pulses: Normal pulses.     Heart sounds: Normal heart sounds.  Pulmonary:     Effort: Pulmonary effort is normal.     Breath sounds: Normal breath sounds.  Lymphadenopathy:     Cervical: No cervical adenopathy.  Neurological:     General: No focal deficit present.     Mental Status: She is alert and oriented to person, place, and time.  Psychiatric:        Mood and Affect: Mood normal.     Assessment/Plan: 1. Myalgia Without palpable adenopathy. Non-specific exam findings. Likely reactive from infection. Will check labs today to further assess. Complete last dose of antibiotic. Supportive measures and pain medication discussed. Rx Meloxicam sent in. Close follow-up discussed.   - CBC w/Diff - CK (Creatine Kinase) - meloxicam (MOBIC) 15 MG tablet; Take 1 tablet (15 mg total) by mouth daily.  Dispense: 30 tablet; Refill: 0   Piedad Climes, PA-C

## 2019-01-10 NOTE — Patient Instructions (Signed)
Please keep well-hydrated and get plenty of rest.  Start the Meloxicam taking with food. Tylenol for breakthrough pain.  Please go to the lab today for blood work.  I will call you with your results. We will alter treatment regimen(s) if indicated by your results.   ER for any acute worsening of symptoms.

## 2019-01-19 ENCOUNTER — Ambulatory Visit (INDEPENDENT_AMBULATORY_CARE_PROVIDER_SITE_OTHER): Payer: No Typology Code available for payment source | Admitting: Physician Assistant

## 2019-01-19 ENCOUNTER — Other Ambulatory Visit: Payer: Self-pay

## 2019-01-19 ENCOUNTER — Encounter: Payer: Self-pay | Admitting: Physician Assistant

## 2019-01-19 VITALS — BP 112/80 | HR 86 | Temp 98.0°F | Resp 16 | Ht 66.0 in | Wt 285.0 lb

## 2019-01-19 DIAGNOSIS — R5382 Chronic fatigue, unspecified: Secondary | ICD-10-CM | POA: Diagnosis not present

## 2019-01-19 DIAGNOSIS — Z0000 Encounter for general adult medical examination without abnormal findings: Secondary | ICD-10-CM | POA: Diagnosis not present

## 2019-01-19 LAB — CBC WITH DIFFERENTIAL/PLATELET
Basophils Absolute: 0 10*3/uL (ref 0.0–0.1)
Basophils Relative: 0.4 % (ref 0.0–3.0)
Eosinophils Absolute: 0.1 10*3/uL (ref 0.0–0.7)
Eosinophils Relative: 2 % (ref 0.0–5.0)
HEMATOCRIT: 33.3 % — AB (ref 36.0–46.0)
Hemoglobin: 10.7 g/dL — ABNORMAL LOW (ref 12.0–15.0)
LYMPHS ABS: 1.6 10*3/uL (ref 0.7–4.0)
Lymphocytes Relative: 23.1 % (ref 12.0–46.0)
MCHC: 32.2 g/dL (ref 30.0–36.0)
MCV: 73.4 fl — ABNORMAL LOW (ref 78.0–100.0)
MONO ABS: 0.4 10*3/uL (ref 0.1–1.0)
Monocytes Relative: 5.5 % (ref 3.0–12.0)
NEUTROS PCT: 69 % (ref 43.0–77.0)
Neutro Abs: 4.9 10*3/uL (ref 1.4–7.7)
PLATELETS: 279 10*3/uL (ref 150.0–400.0)
RBC: 4.54 Mil/uL (ref 3.87–5.11)
RDW: 15 % (ref 11.5–15.5)
WBC: 7.1 10*3/uL (ref 4.0–10.5)

## 2019-01-19 LAB — HEMOGLOBIN A1C: Hgb A1c MFr Bld: 5.9 % (ref 4.6–6.5)

## 2019-01-19 LAB — SEDIMENTATION RATE: SED RATE: 56 mm/h — AB (ref 0–20)

## 2019-01-19 LAB — LIPID PANEL
Cholesterol: 228 mg/dL — ABNORMAL HIGH (ref 0–200)
HDL: 50.5 mg/dL (ref >39.00)
LDL CALC: 161 mg/dL — AB (ref 0–99)
NonHDL: 177.17
TRIGLYCERIDES: 82 mg/dL (ref 0.0–149.0)
Total CHOL/HDL Ratio: 5
VLDL: 16.4 mg/dL (ref 0.0–40.0)

## 2019-01-19 LAB — VITAMIN D 25 HYDROXY (VIT D DEFICIENCY, FRACTURES): VITD: 19.77 ng/mL — AB (ref 30.00–100.00)

## 2019-01-19 LAB — TSH: TSH: 1.13 u[IU]/mL (ref 0.35–4.50)

## 2019-01-19 MED ORDER — DIAZEPAM 5 MG PO TABS: 5.0000 mg | ORAL_TABLET | Freq: Two times a day (BID) | ORAL | 0 refills | Status: DC | PRN

## 2019-01-19 NOTE — Patient Instructions (Signed)
Please go to the lab for blood work.   Our office will call you with your results unless you have chosen to receive results via MyChart.  If your blood work is normal we will follow-up each year for physicals and as scheduled for chronic medical problems.  If anything is abnormal we will treat accordingly and get you in for a follow-up.   Preventive Care 18-39 Years, Female Preventive care refers to lifestyle choices and visits with your health care provider that can promote health and wellness. What does preventive care include?   A yearly physical exam. This is also called an annual well check.  Dental exams once or twice a year.  Routine eye exams. Ask your health care provider how often you should have your eyes checked.  Personal lifestyle choices, including: ? Daily care of your teeth and gums. ? Regular physical activity. ? Eating a healthy diet. ? Avoiding tobacco and drug use. ? Limiting alcohol use. ? Practicing safe sex. ? Taking vitamin and mineral supplements as recommended by your health care provider. What happens during an annual well check? The services and screenings done by your health care provider during your annual well check will depend on your age, overall health, lifestyle risk factors, and family history of disease. Counseling Your health care provider may ask you questions about your:  Alcohol use.  Tobacco use.  Drug use.  Emotional well-being.  Home and relationship well-being.  Sexual activity.  Eating habits.  Work and work environment.  Method of birth control.  Menstrual cycle.  Pregnancy history. Screening You may have the following tests or measurements:  Height, weight, and BMI.  Diabetes screening. This is done by checking your blood sugar (glucose) after you have not eaten for a while (fasting).  Blood pressure.  Lipid and cholesterol levels. These may be checked every 5 years starting at age 20.  Skin check.   Hepatitis C blood test.  Hepatitis B blood test.  Sexually transmitted disease (STD) testing.  BRCA-related cancer screening. This may be done if you have a family history of breast, ovarian, tubal, or peritoneal cancers.  Pelvic exam and Pap test. This may be done every 3 years starting at age 21. Starting at age 30, this may be done every 5 years if you have a Pap test in combination with an HPV test. Discuss your test results, treatment options, and if necessary, the need for more tests with your health care provider. Vaccines Your health care provider may recommend certain vaccines, such as:  Influenza vaccine. This is recommended every year.  Tetanus, diphtheria, and acellular pertussis (Tdap, Td) vaccine. You may need a Td booster every 10 years.  Varicella vaccine. You may need this if you have not been vaccinated.  HPV vaccine. If you are 26 or younger, you may need three doses over 6 months.  Measles, mumps, and rubella (MMR) vaccine. You may need at least one dose of MMR. You may also need a second dose.  Pneumococcal 13-valent conjugate (PCV13) vaccine. You may need this if you have certain conditions and were not previously vaccinated.  Pneumococcal polysaccharide (PPSV23) vaccine. You may need one or two doses if you smoke cigarettes or if you have certain conditions.  Meningococcal vaccine. One dose is recommended if you are age 19-21 years and a first-year college student living in a residence hall, or if you have one of several medical conditions. You may also need additional booster doses.  Hepatitis A vaccine. You   may need this if you have certain conditions or if you travel or work in places where you may be exposed to hepatitis A.  Hepatitis B vaccine. You may need this if you have certain conditions or if you travel or work in places where you may be exposed to hepatitis B.  Haemophilus influenzae type b (Hib) vaccine. You may need this if you have certain risk  factors. Talk to your health care provider about which screenings and vaccines you need and how often you need them. This information is not intended to replace advice given to you by your health care provider. Make sure you discuss any questions you have with your health care provider. Document Released: 12/29/2001 Document Revised: 06/15/2017 Document Reviewed: 09/03/2015 Elsevier Interactive Patient Education  2019 Elsevier Inc. .      

## 2019-01-19 NOTE — Progress Notes (Signed)
Patient presents to clinic today for annual exam.  Patient is fasting for labs.  Acute Concerns: Denies acute concerns today.  Health Maintenance: Immunizations -- up-to-date. PAP -- yearly with GYN due to history of endometriosis. Has scheduled. Sees Dr. Gertie Fey.  Past Medical History:  Diagnosis Date  . GERD (gastroesophageal reflux disease)   . History of CHF (congestive heart failure)    2006 during first preg. dx chf--  evaluated by cardologist (dr Cathie Olden)-- resolved after birth (no issue w/ other preg's)  . Iron deficiency anemia   . Menometrorrhagia   . Nocturnal seizures [facility]) neurologist-  dr sethi/ dr Jannifer [facility] neurology assoc)   last sz 06/14/18  . RLS (restless legs syndrome)   . Seasonal allergies   . Wears glasses     Past Surgical History:  Procedure Laterality Date  . ABDOMINAL HYSTERECTOMY  11/21/2018  . DILITATION & CURRETTAGE/HYSTROSCOPY WITH NOVASURE ABLATION N/A 03/04/2017   Procedure: DILATATION & CURETTAGE/HYSTEROSCOPY WITH NOVASURE ABLATION;  Surgeon: Everlene Farrier, MD;  Location: [facility];  Service: Gynecology;  Laterality: N/A;  . LAPAROSCOPIC CHOLECYSTECTOMY  04/2008  . LAPAROSCOPIC TUBAL LIGATION Bilateral 02/2016  . TRANSTHORACIC ECHOCARDIOGRAM  03/09/2014   ef 55-60%/  trivial MR and TR  . WISDOM TOOTH EXTRACTION  age 59    Current Outpatient Medications on File Prior to Visit  Medication Sig Dispense Refill  . lamoTRIgine (LAMICTAL) 100 MG tablet Take 1 tablet (100 mg total) by mouth 2 (two) times daily. 60 tablet 6  . rOPINIRole (REQUIP) 0.25 MG tablet TAKE 1 TABLET(0.25 MG) BY MOUTH AT BEDTIME 30 tablet 11   No current facility-administered medications on file prior to visit.     Allergies  Allergen Reactions  . Amoxicillin Other (See Comments)    Yeast infection  . Ephedrine Other (See Comments)    Avoids due to possible cause of seizure    Family History  Problem Relation Age of Onset  . Anesthesia problems  Other   . Depression Father     Social History   Socioeconomic History  . Marital status: Married    Spouse name: Not on file  . Number of children: Not on file  . Years of education: Not on file  . Highest education level: Not on file  Occupational History  . Not on file  Social Needs  . Financial resource strain: Not on file  . Food insecurity:    Worry: Not on file    Inability: Not on file  . Transportation needs:    Medical: Not on file    Non-medical: Not on file  Tobacco Use  . Smoking status: Former Smoker    Years: 10.00    Types: Cigarettes    Last attempt to quit: 11/16/2006    Years since quitting: 12.1  . Smokeless tobacco: Never Used  Substance and Sexual Activity  . Alcohol use: Yes    Alcohol/week: 1.0 standard drinks    Types: 1 Glasses of wine per week    Comment: rare  . Drug use: No  . Sexual activity: Yes    Birth control/protection: Surgical  Lifestyle  . Physical activity:    Days per week: Not on file    Minutes per session: Not on file  . Stress: Not on file  Relationships  . Social connections:    Talks on phone: Not on file    Gets together: Not on file    Attends religious service: Not on file  Active member of club or organization: Not on file    Attends meetings of clubs or organizations: Not on file    Relationship status: Not on file  . Intimate partner violence:    Fear of current or ex partner: Not on file    Emotionally abused: Not on file    Physically abused: Not on file    Forced sexual activity: Not on file  Other Topics Concern  . 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 Negative  for fever and weight loss.  HENT: Negative for ear discharge, ear pain, hearing loss and tinnitus.   Eyes: Negative for blurred vision, double vision, photophobia and pain.  Respiratory: Negative for cough and shortness of breath.   Cardiovascular: Negative for chest pain and palpitations.  Gastrointestinal: Negative for abdominal pain, blood in stool, constipation, diarrhea, heartburn, melena, nausea and vomiting.  Genitourinary: Negative for dysuria, flank pain, frequency, hematuria and urgency.  Musculoskeletal: Negative for falls.  Neurological: Negative for dizziness, loss of consciousness and headaches.  Endo/Heme/Allergies: Negative for environmental allergies.  Psychiatric/Behavioral: Negative for depression, hallucinations, substance abuse and suicidal ideas. The patient is not nervous/anxious and does not have insomnia.   All other systems reviewed and are negative.  BP 112/80   Pulse 86   Temp 98 F (36.7 C) (Oral)   Resp 16   Ht _0  (1.676 m)   Wt 285 lb (129.3 kg)   LMP 02/12/2017 (Exact Date)   SpO2 97%   BMI 46.00 kg/m     Physical Exam Vitals signs reviewed.  HENT:     Head: Normocephalic and atraumatic.     Right Ear: Tympanic membrane, ear canal and external ear normal.     Left Ear: Tympanic membrane, ear canal and external ear normal.     Nose: Nose normal. No mucosal edema.     Mouth/Throat:     Pharynx: Uvula midline. No oropharyngeal exudate or posterior oropharyngeal erythema.  Eyes:     Conjunctiva/sclera: Conjunctivae normal.     Pupils: Pupils are equal, round, and reactive to light.  Neck:     Musculoskeletal: Neck supple.     Thyroid: No thyromegaly.  Cardiovascular:     Rate and Rhythm: Normal rate and regular rhythm.     Heart sounds: Normal heart sounds.  Pulmonary:     Effort: Pulmonary effort is normal. No respiratory distress.     Breath sounds: Normal breath sounds. No wheezing or rales.  Abdominal:     General: Bowel sounds are  normal. There is no distension.     Palpations: Abdomen is soft. There is no mass.     Tenderness: There is no abdominal tenderness. There is no guarding or rebound.  Musculoskeletal:        General: Tenderness (trigger point tendenress notes on exam 10 points) present.  Lymphadenopathy:     Cervical: No cervical adenopathy.  Skin:    General: Skin is warm and dry.     Findings: No rash.  Neurological:     Mental Status: She is alert and  oriented to person, place, and time.     Cranial Nerves: No cranial nerve deficit.     Recent Results (from the past 2160 hour(s))  CBC w/Diff     Status: Abnormal   Collection Time: 01/10/19  1:28 PM  Result Value Ref Range   WBC 10.2 4.0 - 10.5 K/uL   RBC 4.94 3.87 - 5.11 Mil/uL   Hemoglobin 11.7 (L) 12.0 - 15.0 g/dL   HCT 36.4 36.0 - 46.0 %   MCV 73.6 (L) 78.0 - 100.0 fl   MCHC 32.1 30.0 - 36.0 g/dL   RDW 15.0 11.5 - 15.5 %   Platelets 306.0 150.0 - 400.0 K/uL   Neutrophils Relative % 64.7 43.0 - 77.0 %   Lymphocytes Relative 28.3 12.0 - 46.0 %   Monocytes Relative 3.7 3.0 - 12.0 %   Eosinophils Relative 2.8 0.0 - 5.0 %   Basophils Relative 0.5 0.0 - 3.0 %   Neutro Abs 6.6 1.4 - 7.7 K/uL   Lymphs Abs 2.9 0.7 - 4.0 K/uL   Monocytes Absolute 0.4 0.1 - 1.0 K/uL   Eosinophils Absolute 0.3 0.0 - 0.7 K/uL   Basophils Absolute 0.0 0.0 - 0.1 K/uL  CK (Creatine Kinase)     Status: None   Collection Time: 01/10/19  1:28 PM  Result Value Ref Range   Total CK 31 7 - 177 U/L   Assessment/Plan: 1. Visit for preventive health examination Depression screen negative. Health Maintenance reviewed. Preventive schedule discussed and handout given in AVS. Will obtain fasting labs today.  - CBC with Differential/Platelet - Hemoglobin A1c - Lipid panel - TSH - Sedimentation rate  2. Chronic fatigue Will check CBC, TSH, ESR, Vitamin D today. Seems to be exacerbated after recent infection. She has point tenderness on exam in several spots. Question  Fibromyalgia but will await lab assessment.  - Vitamin D (25 hydroxy)   Leeanne Rio, PA-C

## 2019-01-20 ENCOUNTER — Other Ambulatory Visit (INDEPENDENT_AMBULATORY_CARE_PROVIDER_SITE_OTHER): Payer: No Typology Code available for payment source

## 2019-01-20 ENCOUNTER — Encounter: Payer: Self-pay | Admitting: Physician Assistant

## 2019-01-20 ENCOUNTER — Other Ambulatory Visit: Payer: Self-pay | Admitting: Physician Assistant

## 2019-01-20 DIAGNOSIS — D649 Anemia, unspecified: Secondary | ICD-10-CM | POA: Diagnosis not present

## 2019-01-20 DIAGNOSIS — E559 Vitamin D deficiency, unspecified: Secondary | ICD-10-CM

## 2019-01-20 DIAGNOSIS — R700 Elevated erythrocyte sedimentation rate: Secondary | ICD-10-CM

## 2019-01-20 LAB — IRON: Iron: 11 ug/dL — ABNORMAL LOW (ref 42–145)

## 2019-01-20 MED ORDER — VITAMIN D-3 25 MCG (1000 UT) PO CAPS: 1.0000 | ORAL_CAPSULE | Freq: Every day | ORAL | 0 refills | Status: AC

## 2019-01-20 MED ORDER — VITAMIN D (ERGOCALCIFEROL) 1.25 MG (50000 UNIT) PO CAPS: ORAL_CAPSULE | ORAL | 0 refills | Status: DC

## 2019-01-24 ENCOUNTER — Other Ambulatory Visit: Payer: Self-pay | Admitting: Physician Assistant

## 2019-01-24 DIAGNOSIS — M797 Fibromyalgia: Secondary | ICD-10-CM

## 2019-01-24 DIAGNOSIS — D509 Iron deficiency anemia, unspecified: Secondary | ICD-10-CM

## 2019-01-24 MED ORDER — GABAPENTIN 100 MG PO CAPS: 100.0000 mg | ORAL_CAPSULE | Freq: Three times a day (TID) | ORAL | 1 refills | Status: DC

## 2019-01-24 MED ORDER — FERROUS SULFATE 325 (65 FE) MG PO TABS: 325.0000 mg | ORAL_TABLET | Freq: Every day | ORAL | 3 refills | Status: DC

## 2019-01-25 ENCOUNTER — Other Ambulatory Visit (INDEPENDENT_AMBULATORY_CARE_PROVIDER_SITE_OTHER): Payer: No Typology Code available for payment source

## 2019-01-25 ENCOUNTER — Other Ambulatory Visit: Payer: Self-pay

## 2019-01-25 DIAGNOSIS — R700 Elevated erythrocyte sedimentation rate: Secondary | ICD-10-CM

## 2019-01-25 LAB — HIGH SENSITIVITY CRP: CRP, High Sensitivity: 19.83 mg/L — ABNORMAL HIGH (ref 0.000–5.000)

## 2019-01-26 ENCOUNTER — Other Ambulatory Visit: Payer: Self-pay | Admitting: Physician Assistant

## 2019-01-26 ENCOUNTER — Encounter: Payer: Self-pay | Admitting: Physician Assistant

## 2019-01-26 DIAGNOSIS — R5382 Chronic fatigue, unspecified: Secondary | ICD-10-CM

## 2019-01-26 DIAGNOSIS — R7982 Elevated C-reactive protein (CRP): Secondary | ICD-10-CM

## 2019-01-30 NOTE — Telephone Encounter (Signed)
Can we check on referral and respond to patient via MyChart. Thank you.

## 2019-02-01 ENCOUNTER — Ambulatory Visit (INDEPENDENT_AMBULATORY_CARE_PROVIDER_SITE_OTHER): Payer: No Typology Code available for payment source

## 2019-02-01 ENCOUNTER — Other Ambulatory Visit: Payer: Self-pay

## 2019-02-01 ENCOUNTER — Encounter: Payer: Self-pay | Admitting: Rheumatology

## 2019-02-01 ENCOUNTER — Ambulatory Visit (INDEPENDENT_AMBULATORY_CARE_PROVIDER_SITE_OTHER): Payer: No Typology Code available for payment source | Admitting: Rheumatology

## 2019-02-01 ENCOUNTER — Encounter: Payer: Self-pay | Admitting: Physician Assistant

## 2019-02-01 VITALS — BP 133/91 | HR 78 | Resp 15 | Ht 66.0 in | Wt 290.4 lb

## 2019-02-01 DIAGNOSIS — G8929 Other chronic pain: Secondary | ICD-10-CM

## 2019-02-01 DIAGNOSIS — R7982 Elevated C-reactive protein (CRP): Secondary | ICD-10-CM

## 2019-02-01 DIAGNOSIS — M79641 Pain in right hand: Secondary | ICD-10-CM | POA: Diagnosis not present

## 2019-02-01 DIAGNOSIS — M79671 Pain in right foot: Secondary | ICD-10-CM | POA: Diagnosis not present

## 2019-02-01 DIAGNOSIS — M25562 Pain in left knee: Secondary | ICD-10-CM | POA: Diagnosis not present

## 2019-02-01 DIAGNOSIS — M1711 Unilateral primary osteoarthritis, right knee: Secondary | ICD-10-CM

## 2019-02-01 DIAGNOSIS — M25561 Pain in right knee: Secondary | ICD-10-CM | POA: Diagnosis not present

## 2019-02-01 DIAGNOSIS — M7731 Calcaneal spur, right foot: Secondary | ICD-10-CM

## 2019-02-01 DIAGNOSIS — M2241 Chondromalacia patellae, right knee: Secondary | ICD-10-CM

## 2019-02-01 DIAGNOSIS — M2242 Chondromalacia patellae, left knee: Secondary | ICD-10-CM

## 2019-02-01 DIAGNOSIS — M1712 Unilateral primary osteoarthritis, left knee: Secondary | ICD-10-CM

## 2019-02-01 DIAGNOSIS — M79642 Pain in left hand: Secondary | ICD-10-CM

## 2019-02-01 DIAGNOSIS — M7732 Calcaneal spur, left foot: Secondary | ICD-10-CM | POA: Diagnosis not present

## 2019-02-01 DIAGNOSIS — G2581 Restless legs syndrome: Secondary | ICD-10-CM

## 2019-02-01 DIAGNOSIS — M7918 Myalgia, other site: Secondary | ICD-10-CM

## 2019-02-01 DIAGNOSIS — R7303 Prediabetes: Secondary | ICD-10-CM

## 2019-02-01 DIAGNOSIS — M79672 Pain in left foot: Secondary | ICD-10-CM | POA: Diagnosis not present

## 2019-02-01 DIAGNOSIS — R5382 Chronic fatigue, unspecified: Secondary | ICD-10-CM

## 2019-02-01 DIAGNOSIS — E785 Hyperlipidemia, unspecified: Secondary | ICD-10-CM

## 2019-02-01 DIAGNOSIS — R569 Unspecified convulsions: Secondary | ICD-10-CM

## 2019-02-01 DIAGNOSIS — E559 Vitamin D deficiency, unspecified: Secondary | ICD-10-CM | POA: Insufficient documentation

## 2019-02-01 DIAGNOSIS — J4520 Mild intermittent asthma, uncomplicated: Secondary | ICD-10-CM

## 2019-02-01 DIAGNOSIS — M170 Bilateral primary osteoarthritis of knee: Secondary | ICD-10-CM

## 2019-02-01 DIAGNOSIS — Z8659 Personal history of other mental and behavioral disorders: Secondary | ICD-10-CM

## 2019-02-01 NOTE — Progress Notes (Signed)
Office Visit Note  Patient: Lindsay Aguilar             Date of Birth: 1981-07-31           MRN: [id number]             PCP: Delorse Limber Referring: Delorse Limber Visit Date: 02/01/2019 Occupation: Stage manager at [employer]  Subjective:  Pain in multiple joints and fatigue.   History of Present Illness: Lindsay Aguilar is a 38 y.o. female in consultation per request of her PCP.  According to patient her symptoms started about a month ago after she developed bronchitis.  She states 1 week later she started feeling some knots under her skin.  She is also noticed increased swelling in her knee joints, ankle joints and her feet.  She states she is having difficulty wearing shoes due to swelling in her feet.  She also has intermittent swelling in her hands.  She has discomfort in her bilateral hands.  She feels her skin is sore to touch.  She has experienced popping in her shoulders, hips and knee joints for long time.  There is no family history of autoimmune disease.  Activities of Daily Living:  Patient reports morning stiffness for 1 hour.   Patient Reports nocturnal pain.  Difficulty dressing/grooming: Denies Difficulty climbing stairs: Reports Difficulty getting out of chair: Reports Difficulty using hands for taps, buttons, cutlery, and/or writing: Denies  Review of Systems  Constitutional: Positive for fatigue. Negative for night sweats, weight gain and weight loss.  HENT: Positive for mouth dryness. Negative for mouth sores, trouble swallowing, trouble swallowing and nose dryness.   Eyes: Negative for pain, redness, itching, visual disturbance and dryness.  Respiratory: Negative for cough, shortness of breath, wheezing and difficulty breathing.   Cardiovascular: Negative for chest pain, palpitations, hypertension, irregular heartbeat and swelling in legs/feet.  Gastrointestinal: Positive for constipation. Negative for abdominal pain, blood in  stool and diarrhea.  Endocrine: Negative for increased urination.  Genitourinary: Negative for painful urination, pelvic pain and vaginal dryness.  Musculoskeletal: Positive for arthralgias, joint pain, joint swelling, myalgias, morning stiffness, muscle tenderness and myalgias. Negative for muscle weakness.  Skin: Positive for rash and nodules/bumps. Negative for color change, hair loss, skin tightness, ulcers and sensitivity to sunlight.       Redness on sin  Allergic/Immunologic: Negative for susceptible to infections.  Neurological: Positive for headaches. Negative for dizziness, light-headedness, memory loss, night sweats and weakness.  Hematological: Negative for swollen glands.  Psychiatric/Behavioral: Negative for depressed mood, confusion and sleep disturbance. The patient is not nervous/anxious.     PMFS History:  Patient Active Problem List   Diagnosis Date Noted  . Vitamin D deficiency 02/01/2019  . Acute bacterial bronchitis 01/04/2019  . Obesity 03/04/2017  . Restless legs syndrome 06/05/2016  . Somnolence, daytime 02/17/2016  . Convulsions/seizures ([place]) 08/01/2015  . Prediabetes 02/15/2014  . Dyslipidemia 02/15/2014  . Chronic headaches 01/04/2014  . Anxiety and depression 01/04/2014  . Mild intermittent asthma   . Anemia     Past Medical History:  Diagnosis Date  . GERD (gastroesophageal reflux disease)   . History of CHF (congestive heart failure)    2006 during first preg. dx chf--  evaluated by cardologist (dr Cathie Olden)-- resolved after birth (no issue w/ other preg's)  . Iron deficiency anemia   . Menometrorrhagia   . Nocturnal seizures [facility]) neurologist-  dr sethi/ dr Jannifer [facility])   last  sz 06/14/18  . RLS (restless legs syndrome)   . Seasonal allergies   . Wears glasses     Family History  Problem Relation Age of Onset  . Anesthesia problems Other   . Depression Father   . Arthritis Mother   . High Cholesterol Mother   . Asthma Son    . Healthy Son   . Hematuria Daughter    Past Surgical History:  Procedure Laterality Date  . ABDOMINAL HYSTERECTOMY  11/21/2018  . DILITATION & CURRETTAGE/HYSTROSCOPY WITH NOVASURE ABLATION N/A 03/04/2017   Procedure: DILATATION & CURETTAGE/HYSTEROSCOPY WITH NOVASURE ABLATION;  Surgeon: Everlene Farrier, MD;  Location: [place];  Service: Gynecology;  Laterality: N/A;  . LAPAROSCOPIC CHOLECYSTECTOMY  04/2008  . LAPAROSCOPIC TUBAL LIGATION Bilateral 02/2016  . TRANSTHORACIC ECHOCARDIOGRAM  03/09/2014   ef 55-60%/  trivial MR and TR  . 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 Influenza,inj,Quad PF,6+ Mos 09/14/2014, 08/13/2017, 09/22/2018  . Influenza-Unspecified 06/20/2018  . Pneumococcal Polysaccharide-23 06/01/2011  . Tdap 06/01/2011     Objective: Vital Signs: BP (!) 133/91 (BP Location: Left Wrist, Patient Position: Sitting, Cuff Size: Normal)   Pulse 78   Resp 15   Ht 5' 6"  (1.676 m)   Wt 290 lb 6.4 oz (131.7 kg)   LMP 02/12/2017 (Exact Date)   BMI 46.87 kg/m    Physical Exam Vitals signs and nursing note reviewed.  Constitutional:      Appearance: She is well-developed.  HENT:     Head: Normocephalic and atraumatic.  Eyes:     Conjunctiva/sclera: Conjunctivae normal.  Neck:     Musculoskeletal: Normal range of motion.  Cardiovascular:     Rate and Rhythm: Normal rate and regular rhythm.     Heart sounds: Normal heart sounds.  Pulmonary:     Effort: Pulmonary effort is normal.     Breath sounds: Normal breath sounds.  Abdominal:     General: Bowel sounds are normal.      Palpations: Abdomen is soft.  Lymphadenopathy:     Cervical: No cervical adenopathy.  Skin:    General: Skin is warm and dry.     Capillary Refill: Capillary refill takes less than 2 seconds.  Neurological:     Mental Status: She is alert and oriented to person, place, and time.  Psychiatric:        Behavior: Behavior normal.      Musculoskeletal Exam: C-spine thoracic lumbar spine good range of motion.  Shoulder joints elbow joints wrist joint MCPs PIPs DIPs been good range of motion with no synovitis.  Although she did have tenderness across her PIP joints but no synovitis was noted.  Hip joints knee joints ankles MTPs PIPs been good range of motion with no synovitis.  She had discomfort range of motion of bilateral knee joints.  She has tenderness across bilateral midfoot.  She has bilateral pes planus.  She has multiple tender points and hyperalgesia.  CDAI Exam: CDAI Score: Not documented Patient Global Assessment: Not documented; Provider Global  Assessment: Not documented Swollen: Not documented; Tender: Not documented Joint Exam   Not documented   There is currently no information documented on the homunculus. Go to the Rheumatology activity and complete the homunculus joint exam.  Investigation: No additional findings.  Imaging: Xr Foot 2 Views Left  Result Date: 02/01/2019 No MTP PIP or DIP narrowing was noted.  No intertarsal or tibiotalar joint space narrowing was noted.  Inferior and posterior calcaneal spurs were noted. Impression: Unremarkable x-ray of the foot except for calcaneal spurs.  Xr Foot 2 Views Right  Result Date: 02/01/2019 No MTP PIP or DIP narrowing was noted.  No intertarsal or subtalar joint space narrowing was noted.  No erosive changes were noted.  Small inferior and posterior calcaneal spurs were noted.  Dorsal spurring was noted. Impression: X-ray of the foot was unremarkable except for calcaneal spurs.  Xr Hand 2 View Left  Result Date:  02/01/2019 No MCP PIP or DIP narrowing was noted.  No intercarpal or radiocarpal joint space narrowing was noted.  No erosive changes were noted. Impression: Unremarkable x-ray of the hand.  Xr Hand 2 View Right  Result Date: 02/01/2019 No MCP PIP or DIP narrowing was noted.  No intercarpal or radiocarpal joint space narrowing was noted.  No erosive changes were noted. Impression: Unremarkable x-ray of the hand.  Xr Knee 3 View Left  Result Date: 02/01/2019 Mild medial compartment narrowing and mild patellofemoral narrowing was noted.  Chondrocalcinosis was noted. Impression: These findings are consistent with mild osteoarthritis and mild chondromalacia patella.  Xr Knee 3 View Right  Result Date: 02/01/2019 Moderate medial compartment narrowing was noted.  Moderate patellofemoral narrowing was noted.  No chondrocalcinosis was noted. Impression: These findings are consistent with moderate osteoarthritis and moderate chondromalacia patella.   Recent Labs: Lab Results  Component Value Date   WBC 7.1 01/19/2019   HGB 10.7 (L) 01/19/2019   PLT 279.0 01/19/2019   NA 139 06/23/2018   K 4.3 06/23/2018   CL 109 06/23/2018   CO2 24 06/23/2018   GLUCOSE 95 06/23/2018   BUN 15 06/23/2018   CREATININE 0.72 06/23/2018   BILITOT 0.4 03/03/2017   ALKPHOS 52 03/03/2017   AST 16 03/03/2017   ALT 17 03/03/2017   PROT 7.6 03/03/2017   ALBUMIN 3.8 03/03/2017   CALCIUM 8.5 (L) 06/23/2018   GFRAA >60 06/23/2018  January 19, 2019 TSH normal, ESR 56, C-reactive protein 19.83, vitamin D 19.77, CK 31  Speciality Comments: No specialty comments available.  Procedures:  No procedures performed Allergies: Amoxicillin and Ephedrine   Assessment / Plan:     Visit Diagnoses: Pain in both hands -patient complains of pain in both hands and intermittent swelling.  No synovitis was noted on examination.  Plan: XR Hand 2 View Right, XR Hand 2 View Left, Sedimentation rate, Rheumatoid factor, Cyclic citrul  peptide antibody, IgG, 14-3-3 eta Protein, ANA, Uric acid, Angiotensin converting enzyme  Chronic pain of both knees -complains of pain in bilateral knee joints.  No warmth swelling or effusion was noted.  Plan: XR KNEE 3 VIEW RIGHT, XR KNEE 3 VIEW LEFT  Pain in both feet -she has discomfort in her bilateral feet.  She has pes planus.  Plan: XR Foot 2 Views Right, XR Foot 2 Views Left  Myofascial pain-she has generalized hyperalgesia and positive tender points.  Chronic fatigue -patient complains of increased fatigue.  Which may be related to vitamin D deficiency.  She is on vitamin D supplement currently.  Plan: COMPLETE METABOLIC PANEL WITH GFR  Elevated C-reactive protein (CRP)-patient had recent bronchitis.  I will repeat sed rate today.  Vitamin D deficiency-she is on vitamin D supplement.  Other medical problems are listed as follows:  Mild intermittent asthma without complication  History of anxiety  Convulsions, unspecified convulsion type ([place]) - X2  Dyslipidemia  Prediabetes  Restless legs syndrome   Orders: Orders Placed This Encounter  Procedures  . XR Hand 2 View Right  . XR Hand 2 View Left  . XR KNEE 3 VIEW RIGHT  . XR KNEE 3 VIEW LEFT  . XR Foot 2 Views Right  . XR Foot 2 Views Left  . Sedimentation rate  . Rheumatoid factor  . Cyclic citrul peptide antibody, IgG  . 14-3-3 eta Protein  . ANA  . Uric acid  . Angiotensin converting enzyme  . COMPLETE METABOLIC PANEL WITH GFR   No orders of the defined types were placed in this encounter.   Face-to-face time spent with patient was 50 minutes. Greater than 50% of time was spent in counseling and coordination of care.  Follow-Up Instructions: Return for Elevated ESR, osteoarthritis.   Bo Merino, MD  Note - This record has been created using Editor, commissioning.  Chart creation errors have been sought, but may not always  have been located. Such creation errors do not reflect on  the standard of  medical care.

## 2019-02-02 ENCOUNTER — Other Ambulatory Visit: Payer: Self-pay | Admitting: Physician Assistant

## 2019-02-02 DIAGNOSIS — M791 Myalgia, unspecified site: Secondary | ICD-10-CM

## 2019-02-03 ENCOUNTER — Encounter: Payer: Self-pay | Admitting: Rheumatology

## 2019-02-04 ENCOUNTER — Encounter: Payer: Self-pay | Admitting: Physician Assistant

## 2019-02-04 ENCOUNTER — Other Ambulatory Visit: Payer: Self-pay | Admitting: Physician Assistant

## 2019-02-04 DIAGNOSIS — M791 Myalgia, unspecified site: Secondary | ICD-10-CM

## 2019-02-04 DIAGNOSIS — M797 Fibromyalgia: Secondary | ICD-10-CM

## 2019-02-04 DIAGNOSIS — E559 Vitamin D deficiency, unspecified: Secondary | ICD-10-CM

## 2019-02-05 LAB — CYCLIC CITRUL PEPTIDE ANTIBODY, IGG

## 2019-02-05 LAB — COMPLETE METABOLIC PANEL WITH GFR
AG Ratio: 1.9 (calc) (ref 1.0–2.5)
ALBUMIN MSPROF: 4.4 g/dL (ref 3.6–5.1)
ALKALINE PHOSPHATASE (APISO): 64 U/L (ref 31–125)
ALT: 19 U/L (ref 6–29)
AST: 17 U/L (ref 10–30)
BILIRUBIN TOTAL: 0.3 mg/dL (ref 0.2–1.2)
BUN: 14 mg/dL (ref 7–25)
CHLORIDE: 104 mmol/L (ref 98–110)
CO2: 29 mmol/L (ref 20–32)
Calcium: 9.5 mg/dL (ref 8.6–10.2)
Creat: 0.86 mg/dL (ref 0.50–1.10)
GFR, Est African American: 99 mL/min/{1.73_m2} (ref >=60)
GFR, Est Non African American: 86 mL/min/{1.73_m2} (ref >=60)
GLOBULIN: 2.3 g/dL (ref 1.9–3.7)
Glucose, Bld: 92 mg/dL (ref 65–99)
POTASSIUM: 4.4 mmol/L (ref 3.5–5.3)
SODIUM: 140 mmol/L (ref 135–146)
Total Protein: 6.7 g/dL (ref 6.1–8.1)

## 2019-02-05 LAB — ANGIOTENSIN CONVERTING ENZYME: Angiotensin-Converting Enzyme: 75 U/L — ABNORMAL HIGH (ref 9–67)

## 2019-02-05 LAB — SEDIMENTATION RATE: SED RATE: 38 mm/h — AB (ref 0–20)

## 2019-02-05 LAB — ANA: ANA: NEGATIVE

## 2019-02-05 LAB — URIC ACID: Uric Acid, Serum: 4.2 mg/dL (ref 2.5–7.0)

## 2019-02-05 LAB — 14-3-3 ETA PROTEIN: 14-3-3 eta Protein: <0.2 ng/mL (ref <0.2)

## 2019-02-05 LAB — RHEUMATOID FACTOR

## 2019-02-06 MED ORDER — GABAPENTIN 300 MG PO CAPS: ORAL_CAPSULE | ORAL | 3 refills | Status: DC

## 2019-02-06 MED ORDER — GABAPENTIN 100 MG PO CAPS: ORAL_CAPSULE | ORAL | 3 refills | Status: DC

## 2019-02-06 MED ORDER — MELOXICAM 15 MG PO TABS: 15.0000 mg | ORAL_TABLET | Freq: Every day | ORAL | 0 refills | Status: DC

## 2019-02-06 NOTE — Progress Notes (Signed)
I will discuss at the fu visit.

## 2019-02-07 ENCOUNTER — Other Ambulatory Visit: Payer: Self-pay | Admitting: Emergency Medicine

## 2019-02-07 DIAGNOSIS — E559 Vitamin D deficiency, unspecified: Secondary | ICD-10-CM

## 2019-02-07 MED ORDER — VITAMIN D (ERGOCALCIFEROL) 1.25 MG (50000 UNIT) PO CAPS: ORAL_CAPSULE | ORAL | 2 refills | Status: DC

## 2019-02-13 ENCOUNTER — Other Ambulatory Visit: Payer: Self-pay

## 2019-02-13 ENCOUNTER — Encounter: Payer: Self-pay | Admitting: Physician Assistant

## 2019-02-13 ENCOUNTER — Ambulatory Visit (INDEPENDENT_AMBULATORY_CARE_PROVIDER_SITE_OTHER): Payer: No Typology Code available for payment source | Admitting: Physician Assistant

## 2019-02-13 VITALS — BP 118/82 | HR 80 | Temp 97.7°F | Resp 16 | Ht 66.0 in | Wt 293.0 lb

## 2019-02-13 DIAGNOSIS — R635 Abnormal weight gain: Secondary | ICD-10-CM | POA: Diagnosis not present

## 2019-02-13 DIAGNOSIS — M791 Myalgia, unspecified site: Secondary | ICD-10-CM | POA: Diagnosis not present

## 2019-02-13 LAB — CBC WITH DIFFERENTIAL/PLATELET
BASOS PCT: 0.3 % (ref 0.0–3.0)
Basophils Absolute: 0 10*3/uL (ref 0.0–0.1)
Eosinophils Absolute: 0.1 10*3/uL (ref 0.0–0.7)
Eosinophils Relative: 1.3 % (ref 0.0–5.0)
HCT: 34.1 % — ABNORMAL LOW (ref 36.0–46.0)
Hemoglobin: 11 g/dL — ABNORMAL LOW (ref 12.0–15.0)
Lymphocytes Relative: 24.6 % (ref 12.0–46.0)
Lymphs Abs: 1.9 10*3/uL (ref 0.7–4.0)
MCHC: 32.2 g/dL (ref 30.0–36.0)
MCV: 72.9 fl — ABNORMAL LOW (ref 78.0–100.0)
Monocytes Absolute: 0.4 10*3/uL (ref 0.1–1.0)
Monocytes Relative: 4.9 % (ref 3.0–12.0)
Neutro Abs: 5.4 10*3/uL (ref 1.4–7.7)
Neutrophils Relative %: 68.9 % (ref 43.0–77.0)
Platelets: 295 10*3/uL (ref 150.0–400.0)
RBC: 4.67 Mil/uL (ref 3.87–5.11)
RDW: 16.1 % — ABNORMAL HIGH (ref 11.5–15.5)
WBC: 7.8 10*3/uL (ref 4.0–10.5)

## 2019-02-13 LAB — T4, FREE: Free T4: 0.77 ng/dL (ref 0.60–1.60)

## 2019-02-13 LAB — T3, FREE: T3, Free: 3.6 pg/mL (ref 2.3–4.2)

## 2019-02-13 MED ORDER — CELECOXIB 100 MG PO CAPS: 100.0000 mg | ORAL_CAPSULE | Freq: Two times a day (BID) | ORAL | 0 refills | Status: DC

## 2019-02-13 NOTE — Patient Instructions (Signed)
Please go to the lab today for blood work.  I will call you with your results. We will alter treatment regimen(s) if indicated by your results.   Stop the Meloxicam. Start the Celebrex. Continue Tylenol if needed. Continue chronic medications. Follow-up with specialist next week as scheduled.

## 2019-02-13 NOTE — Progress Notes (Signed)
Patient with history of perimenopause, chronic myalgias/arthralgias (currently having assessment by Rheumatology) presents to clinic today c/o continued pain in joints and muscles along with the sensation of swelling in her hands, legs and feet over the past week. Denies fever or chills. Denies change in diet. Is trying to watch portions. Notes mainly tightness in her hands and feet. Feels like there are knots in the skin sometimes per patient. Notes continued fatigue. Has gained 3 pounds since last check.  Denies SOB, PND or orthopnea. Has a documented history of CHF but only acute episode during first pregnancy in 2006. Had evaluation with Cardiology during and after this with completely negative workup. Patient had evaluation with rheumatology 1.5 weeks ago at which time a full workup was completed. Has appt next Tuesday to review results and next steps.  Past Medical History:  Diagnosis Date  . GERD (gastroesophageal reflux disease)   . History of CHF (congestive heart failure)    2006 during first preg. dx chf--  evaluated by cardologist (dr Cathie Olden)-- resolved after birth (no issue w/ other preg's)  . Iron deficiency anemia   . Menometrorrhagia   . Nocturnal seizures [facility]) neurologist-  dr sethi/ dr Jannifer Franklin [facility])   last sz 06/14/18  . RLS (restless legs syndrome)   . Seasonal allergies   . Wears glasses     Current Outpatient Medications on File Prior to Visit  Medication Sig Dispense Refill  . Acetaminophen (TYLENOL PO) Take by mouth as needed.    Marland Kitchen Bioflavonoid Products (ESTER C PO) Take by mouth daily.    . Cholecalciferol (VITAMIN D-3) 25 MCG (1000 UT) CAPS Take 1 capsule (1,000 Units total) by mouth daily. 30 capsule 0  . diazepam (VALIUM) 5 MG tablet Take 1 tablet (5 mg total) by mouth every 12 (twelve) hours as needed. 30 tablet 0  . ferrous sulfate 325 (65 FE) MG tablet Take 1 tablet (325 mg total) by mouth daily with breakfast. 30 tablet 3  . gabapentin  (NEURONTIN) 100 MG capsule Take midday. She is taking 300 mg AM and QHS which as well (separate Rx) 30 capsule 3  . gabapentin (NEURONTIN) 300 MG capsule Take 1 capsule each morning and evening. (Takes a 153m capsule midday) 60 capsule 3  . lamoTRIgine (LAMICTAL) 100 MG tablet Take 1 tablet (100 mg total) by mouth 2 (two) times daily. 60 tablet 6  . rOPINIRole (REQUIP) 0.25 MG tablet TAKE 1 TABLET(0.25 MG) BY MOUTH AT BEDTIME 30 tablet 11  . Vitamin D, Ergocalciferol, (DRISDOL) 1.25 MG (50000 UT) CAPS capsule Take 1 capsule by mouth weekly for 12 weeks 4 capsule 2   No current facility-administered medications on file prior to visit.     Allergies  Allergen Reactions  . Amoxicillin Other (See Comments)    Yeast infection  . Ephedrine Other (See Comments)    Avoids due to possible cause of seizure    Family History  Problem Relation Age of Onset  . Anesthesia problems Other   . Depression Father   . Arthritis Mother   . High Cholesterol Mother   . Asthma Son   . Healthy Son   . Hematuria Daughter     Social History   Socioeconomic History  . Marital status: Married    Spouse name: Not on file  . Number of children: Not on file  . Years of education: Not on file  . Highest education level: Not on file  Occupational History  . Not  on file  Social Needs  . Financial resource strain: Not on file  . Food insecurity:    Worry: Not on file    Inability: Not on file  . Transportation needs:    Medical: Not on file    Non-medical: Not on file  Tobacco Use  . Smoking status: Former Smoker    Years: 10.00    Types: Cigarettes    Last attempt to quit: 11/16/2006    Years since quitting: 12.2  . Smokeless tobacco: Never Used  Substance and Sexual Activity  . Alcohol use: Yes    Alcohol/week: 1.0 standard drinks    Types: 1 Glasses of wine per week    Comment: rare  . Drug use: No  . Sexual activity: Yes    Birth control/protection: Surgical  Lifestyle  . Physical  activity:    Days per week: Not on file    Minutes per session: Not on file  . Stress: Not on file  Relationships  . Social connections:    Talks on phone: Not on file    Gets together: Not on file    Attends religious service: Not on file    Active member of club or organization: Not on file    Attends meetings of clubs or organizations: Not on file    Relationship status: Not on file  Other Topics Concern  . 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  All other ROS are negative.  BP 118/82   Pulse 80   Temp 97.7 F (36.5 C) (Oral)   Resp 16   Ht 5' 6" (1.676 m)   Wt 293 lb (132.9 kg)   LMP 02/12/2017 (Exact Date)   SpO2 98%   BMI 47.29 kg/m   Physical Exam Vitals signs reviewed.  Constitutional:      General: She is not in acute distress.    Appearance: Normal appearance. She is obese. She is not ill-appearing, toxic-appearing or diaphoretic.  HENT:     Head: Normocephalic and atraumatic.     Mouth/Throat:     Mouth: Mucous membranes are moist.  Eyes:     Conjunctiva/sclera: Conjunctivae normal.  Cardiovascular:     Rate and Rhythm: Normal rate and regular rhythm.     Pulses: Normal pulses.          Dorsalis pedis pulses are 2+ on the right side and 2+ on the left side.       Posterior tibial pulses are 2+ on the right side and 2+ on the left side.     Heart sounds: Normal heart sounds.     Comments: Exam negative for edema of lower extremities. There is some mild puffiness of hands without erythema. This seems to be patient baseline however. Does not seem impressive compared to prior visits. Pulmonary:     Effort: Pulmonary effort is normal.     Breath sounds:  Normal breath sounds. No rales.  Neurological:     General: No focal deficit present.     Mental Status: She is alert and oriented to person, place, and time.  Mental status is at baseline.  Psychiatric:        Mood and Affect: Mood normal.        Behavior: Behavior normal.     Recent Results (from the past 2160 hour(s))  CBC w/Diff     Status: Abnormal   Collection Time: 01/10/19  1:28 PM  Result Value Ref Range   WBC 10.2 4.0 - 10.5 K/uL   RBC 4.94 3.87 - 5.11 Mil/uL   Hemoglobin 11.7 (L) 12.0 - 15.0 g/dL   HCT 36.4 36.0 - 46.0 %   MCV 73.6 (L) 78.0 - 100.0 fl   MCHC 32.1 30.0 - 36.0 g/dL   RDW 15.0 11.5 - 15.5 %   Platelets 306.0 150.0 - 400.0 K/uL   Neutrophils Relative % 64.7 43.0 - 77.0 %   Lymphocytes Relative 28.3 12.0 - 46.0 %   Monocytes Relative 3.7 3.0 - 12.0 %   Eosinophils Relative 2.8 0.0 - 5.0 %   Basophils Relative 0.5 0.0 - 3.0 %   Neutro Abs 6.6 1.4 - 7.7 K/uL   Lymphs Abs 2.9 0.7 - 4.0 K/uL   Monocytes Absolute 0.4 0.1 - 1.0 K/uL   Eosinophils Absolute 0.3 0.0 - 0.7 K/uL   Basophils Absolute 0.0 0.0 - 0.1 K/uL  CK (Creatine Kinase)     Status: None   Collection Time: 01/10/19  1:28 PM  Result Value Ref Range   Total CK 31 7 - 177 U/L  CBC with Differential/Platelet     Status: Abnormal   Collection Time: 01/19/19  9:32 AM  Result Value Ref Range   WBC 7.1 4.0 - 10.5 K/uL   RBC 4.54 3.87 - 5.11 Mil/uL   Hemoglobin 10.7 (L) 12.0 - 15.0 g/dL   HCT 33.3 (L) 36.0 - 46.0 %   MCV 73.4 (L) 78.0 - 100.0 fl   MCHC 32.2 30.0 - 36.0 g/dL   RDW 15.0 11.5 - 15.5 %   Platelets 279.0 150.0 - 400.0 K/uL   Neutrophils Relative % 69.0 43.0 - 77.0 %   Lymphocytes Relative 23.1 12.0 - 46.0 %   Monocytes Relative 5.5 3.0 - 12.0 %   Eosinophils Relative 2.0 0.0 - 5.0 %   Basophils Relative 0.4 0.0 - 3.0 %   Neutro Abs 4.9 1.4 - 7.7 K/uL   Lymphs Abs 1.6 0.7 - 4.0 K/uL   Monocytes Absolute 0.4 0.1 - 1.0 K/uL   Eosinophils Absolute 0.1 0.0 - 0.7 K/uL   Basophils  Absolute 0.0 0.0 - 0.1 K/uL  Hemoglobin A1c     Status: None   Collection Time: 01/19/19  9:32 AM  Result Value Ref Range   Hgb A1c MFr Bld 5.9 4.6 - 6.5 %    Comment: Glycemic Control Guidelines for People with Diabetes:Non Diabetic:  <6%Goal of Therapy: <7%Additional Action Suggested:  >8%   Lipid panel     Status: Abnormal   Collection Time: 01/19/19  9:32 AM  Result Value Ref Range   Cholesterol 228 (H) 0 - 200 mg/dL    Comment: ATP III Classification       Desirable:  < 200 mg/dL               Borderline High:  200 - 239 mg/dL          High:  > = 240 mg/dL   Triglycerides 82.0 0.0 - 149.0 mg/dL    Comment: Normal:  <150 mg/dLBorderline High:  150 - 199 mg/dL   HDL 50.50 >39.00  mg/dL   VLDL 16.4 0.0 - 40.0 mg/dL   LDL Cholesterol 161 (H) 0 - 99 mg/dL   Total CHOL/HDL Ratio 5     Comment:                Men          Women1/2 Average Risk     3.4          3.3Average Risk          5.0          4.42X Average Risk          9.6          7.13X Average Risk          15.0          11.0                       NonHDL 177.17     Comment: NOTE:  Non-HDL goal should be 30 mg/dL higher than patient's LDL goal (i.e. LDL goal of < 70 mg/dL, would have non-HDL goal of < 100 mg/dL)  TSH     Status: None   Collection Time: 01/19/19  9:32 AM  Result Value Ref Range   TSH 1.13 0.35 - 4.50 uIU/mL  Sedimentation rate     Status: Abnormal   Collection Time: 01/19/19  9:32 AM  Result Value Ref Range   Sed Rate 56 (H) 0 - 20 mm/hr  Vitamin D (25 hydroxy)     Status: Abnormal   Collection Time: 01/19/19  9:32 AM  Result Value Ref Range   VITD 19.77 (L) 30.00 - 100.00 ng/mL  Iron     Status: Abnormal   Collection Time: 01/20/19  3:49 PM  Result Value Ref Range   Iron 11 (L) 42 - 145 ug/dL  High sensitivity CRP     Status: Abnormal   Collection Time: 01/25/19  8:12 AM  Result Value Ref Range   CRP, High Sensitivity 19.830 (H) 0.000 - 5.000 mg/L    Comment: Note:  An elevated hs-CRP (>5 mg/L) should be  repeated after 2 weeks to rule out recent infection or trauma.  Sedimentation rate     Status: Abnormal   Collection Time: 02/01/19  9:50 AM  Result Value Ref Range   Sed Rate 38 (H) 0 - 20 mm/h  Rheumatoid factor     Status: None   Collection Time: 02/01/19  9:50 AM  Result Value Ref Range   Rhuematoid fact SerPl-aCnc <65 <46 IU/mL  Cyclic citrul peptide antibody, IgG     Status: None   Collection Time: 02/01/19  9:50 AM  Result Value Ref Range   Cyclic Citrullin Peptide Ab <16 UNITS    Comment: Reference Range Negative:            <20 Weak Positive:       20-39 Moderate Positive:   40-59 Strong Positive:     >59 .   14-3-3 eta Protein     Status: None   Collection Time: 02/01/19  9:50 AM  Result Value Ref Range   14-3-3 eta Protein <0.2 <0.2 ng/mL    Comment: . The 14-3-3eta protein is a marker of synovial inflammation that is released into synovial fluid and peripheral blood in rheumatoid arthritis (RA) and erosive psoriatic arthritis. One in five RF and CCP seronegative early stage RA patients is found to be positive for 14-3-3eta protein. Patients with active joint  RA disease have higher values of 14-3-3eta protein than those with inactive RA or psoriasis without arthritis. 14-3-3eta protein has a 93% specificity in patients with RA. Values > or = 0.2 ng/mL are elevated and indicative of RA disease or erosive psoriatic arthritis. Values >0.50 ng/mL are associated with more aggressive RA disease and poorer outcomes. Unlike RF and CCP, 14-3-3eta protein is a therapeutically modifiable marker to monitor response to therapy. A decrease in 14-3-3eta protein in response to DMARDs (disease-modifying antirheumatic drugs) and anti-TNF (tumor necrosis factor) drugs indicates better clinical outcomes; an increase is associated with  worse outcomes despite apparent clinical remission. . For further information please visit:  http://www.[facility].com/testcenter/testguide.action? dc=TS-RmArthPnl . This test was developed and its analytical performance characteristics have been determined by [facility]. It has not been cleared or approved by FDA. This assay has been validated pursuant to the CLIA regulations and is used for clinical purposes. .   ANA     Status: None   Collection Time: 02/01/19  9:50 AM  Result Value Ref Range   Anti Nuclear Antibody (ANA) NEGATIVE NEGATIVE    Comment: ANA IFA is a first line screen for detecting the presence of up to approximately 150 autoantibodies in various autoimmune diseases. A negative ANA IFA result suggests an ANA-associated autoimmune disease is not present at this time, but is not definitive. If there is high clinical suspicion for Sjogren's syndrome, testing for anti-SS-A/Ro antibody should be considered. Anti-Jo-1 antibody should be considered for clinically suspected inflammatory myopathies. . AC-0: Negative . International Consensus on ANA Patterns (https://www.hernandez-brewer.com/) . For additional information, please refer to http://education.[facility].com/faq/FAQ177 (This link is being provided for informational/ educational purposes only.) .   Uric acid     Status: None   Collection Time: 02/01/19  9:50 AM  Result Value Ref Range   Uric Acid, Serum 4.2 2.5 - 7.0 mg/dL    Comment: Therapeutic target for gout patients: <6.0 mg/dL .   Angiotensin converting enzyme     Status: Abnormal   Collection Time: 02/01/19  9:50 AM  Result Value Ref Range   Angiotensin-Converting Enzyme 75 (H) 9 - 67 U/L  COMPLETE METABOLIC PANEL WITH GFR     Status: None   Collection Time: 02/01/19  9:50 AM  Result Value Ref Range   Glucose, Bld 92 65 - 99 mg/dL    Comment: .            Fasting reference interval .    BUN 14 7 - 25 mg/dL   Creat 0.86 0.50 - 1.10 mg/dL   GFR, Est Non African  American 86 > OR = 60 mL/min/1.52m   GFR, Est African American 99 > OR = 60 mL/min/1.7108m  BUN/Creatinine Ratio NOT APPLICABLE 6 - 22 (calc)   Sodium 140 135 - 146 mmol/L   Potassium 4.4 3.5 - 5.3 mmol/L   Chloride 104 98 - 110 mmol/L   CO2 29 20 - 32 mmol/L   Calcium 9.5 8.6 - 10.2 mg/dL   Total Protein 6.7 6.1 - 8.1 g/dL   Albumin 4.4 3.6 - 5.1 g/dL   Globulin 2.3 1.9 - 3.7 g/dL (calc)   AG Ratio 1.9 1.0 - 2.5 (calc)   Total Bilirubin 0.3 0.2 - 1.2 mg/dL   Alkaline phosphatase (APISO) 64 31 - 125 U/L   AST 17 10 - 30 U/L   ALT 19 6 - 29 U/L    Assessment/Plan: 1. Weight gain Will reassess thyroid studies --  recent normal TSH but will check free thryoid hormones giving her symptoms and concern. Repeat CBC today. Exercise is limited currently due to pain. Discussed proper diet and caloric intake to help promote safe weight loss.  - T4, free - T3, free - Thyroglobulin antibody - CBC with Differential/Platelet  2. Myalgia No signs of joint swelling or edema on examination. Areas of concern seem to correspond with areas of adipose tissue. Do not feel any lymphadenopathy or Newcastle nodules on examination. Fingers are puffy but seem to be consistent with prior exams. Reviewed labs/assessment performed by Rheumatology. Labs unremarkable except for continued mild elevation in ESR and an elevated ACE level (can have false positives). Has follow-up with Rheumatology next week so we will see if they feel true concern for extrapulmonary sarcoidosis giving symptoms and elevated ACE level. Will stop her Meloxicam and try Celebrex for now. Continue other medications as directed.     Leeanne Rio, PA-C

## 2019-02-14 LAB — THYROGLOBULIN ANTIBODY: Thyroglobulin Ab: <1 IU/mL (ref <=1)

## 2019-02-15 DIAGNOSIS — M170 Bilateral primary osteoarthritis of knee: Secondary | ICD-10-CM | POA: Insufficient documentation

## 2019-02-15 DIAGNOSIS — M2140 Flat foot [pes planus] (acquired), unspecified foot: Secondary | ICD-10-CM | POA: Insufficient documentation

## 2019-02-15 DIAGNOSIS — M7918 Myalgia, other site: Secondary | ICD-10-CM | POA: Insufficient documentation

## 2019-02-15 NOTE — Progress Notes (Signed)
February 01, 2019 ESR 38, ACE 75, RF negative, anti-CCP negative, 14 3 3  eta negative, ANA negative, uric acid 4.2 Chest x-ray July 30, 2015 was normal January 19, 2019 TSH normal, ESR 56, C-reactive protein 19.83, vitamin D 19.77, CK 31

## 2019-02-16 ENCOUNTER — Encounter: Payer: Self-pay | Admitting: Physician Assistant

## 2019-02-16 ENCOUNTER — Other Ambulatory Visit: Payer: Self-pay | Admitting: Physician Assistant

## 2019-02-16 DIAGNOSIS — D509 Iron deficiency anemia, unspecified: Secondary | ICD-10-CM

## 2019-02-16 MED ORDER — FERROUS SULFATE 325 (65 FE) MG PO TABS: 325.0000 mg | ORAL_TABLET | Freq: Two times a day (BID) | ORAL | 3 refills | Status: DC

## 2019-02-21 ENCOUNTER — Encounter: Payer: Self-pay | Admitting: Rheumatology

## 2019-02-21 ENCOUNTER — Telehealth (INDEPENDENT_AMBULATORY_CARE_PROVIDER_SITE_OTHER): Payer: No Typology Code available for payment source | Admitting: Rheumatology

## 2019-02-21 DIAGNOSIS — M79642 Pain in left hand: Secondary | ICD-10-CM

## 2019-02-21 DIAGNOSIS — M7918 Myalgia, other site: Secondary | ICD-10-CM

## 2019-02-21 DIAGNOSIS — E559 Vitamin D deficiency, unspecified: Secondary | ICD-10-CM

## 2019-02-21 DIAGNOSIS — M170 Bilateral primary osteoarthritis of knee: Secondary | ICD-10-CM

## 2019-02-21 DIAGNOSIS — F32A Depression, unspecified: Secondary | ICD-10-CM

## 2019-02-21 DIAGNOSIS — R569 Unspecified convulsions: Secondary | ICD-10-CM

## 2019-02-21 DIAGNOSIS — G2581 Restless legs syndrome: Secondary | ICD-10-CM

## 2019-02-21 DIAGNOSIS — M79641 Pain in right hand: Secondary | ICD-10-CM | POA: Diagnosis not present

## 2019-02-21 DIAGNOSIS — M2142 Flat foot [pes planus] (acquired), left foot: Secondary | ICD-10-CM

## 2019-02-21 DIAGNOSIS — E785 Hyperlipidemia, unspecified: Secondary | ICD-10-CM

## 2019-02-21 DIAGNOSIS — R5383 Other fatigue: Secondary | ICD-10-CM

## 2019-02-21 DIAGNOSIS — F419 Anxiety disorder, unspecified: Secondary | ICD-10-CM

## 2019-02-21 DIAGNOSIS — F329 Major depressive disorder, single episode, unspecified: Secondary | ICD-10-CM

## 2019-02-21 DIAGNOSIS — M2141 Flat foot [pes planus] (acquired), right foot: Secondary | ICD-10-CM

## 2019-02-21 DIAGNOSIS — J4520 Mild intermittent asthma, uncomplicated: Secondary | ICD-10-CM

## 2019-02-21 NOTE — Patient Instructions (Signed)

## 2019-02-21 NOTE — Progress Notes (Signed)
Virtual Visit via Video Note  I connected with Lindsay Aguilar on 02/21/19 at  9:15 AM EDT by a video enabled telemedicine application and verified that I am speaking with the correct person using two identifiers.   I discussed the limitations of evaluation and management by telemedicine and the availability of in person appointments. The patient expressed understanding and agreed to proceed.  CC: Pain in both knees   History of Present Illness: Patient is a 38 year old female with a past medical history of osteoarthritis and myofascial pain syndrome.  She has generalized muscle aches and muscle tenderness.  She has chronic fatigue.  Her PCP checked lab work last week.  She has iron deficiency anemia and vitamin D deficiency.  She was started on ferrous sulfate and a vitamin D supplement.  She continues to have chronic pain in both knee joints.  She has no joint swelling.  She has pain in both hands and intermittent numbness in both hands.  She takes celebrex 100 mg BID for pain relief.  Review of Systems  Constitutional: Positive for malaise/fatigue. Negative for fever.  HENT:       +mouth dryness  Eyes: Negative for photophobia, pain, discharge and redness.  Respiratory: Negative for cough, shortness of breath and wheezing.   Cardiovascular: Negative for chest pain and palpitations.  Gastrointestinal: Negative for blood in stool, constipation and diarrhea.  Genitourinary: Negative for dysuria.  Musculoskeletal: Positive for joint pain and myalgias. Negative for back pain and neck pain.  Skin: Negative for rash.  Neurological: Negative for dizziness and headaches.  Psychiatric/Behavioral: Negative for depression. The patient is nervous/anxious and has insomnia.       Observations/Objective: Physical Exam  Constitutional: She is oriented to person, place, and time and well-developed, well-nourished, and in no distress.  HENT:  Head: Normocephalic and atraumatic.  Eyes: Conjunctivae  are normal.  Pulmonary/Chest: Effort normal.  Neurological: She is alert and oriented to person, place, and time.  Psychiatric: Mood, memory, affect and judgment normal.   Patient reports morning stiffness for 30 minutes.   Patient denies nocturnal pain.  Difficulty dressing/grooming: Denies Difficulty climbing stairs: Reports Difficulty getting out of chair: Denies Difficulty using hands for taps, buttons, cutlery, and/or writing: Denies  Finding:  February 01, 2019 ESR 38, ACE 75, RF negative, anti-CCP negative, 14 3 3  eta negative, ANA negative, uric acid 4.2 Chest x-ray July 30, 2015 was normal January 19, 2019 TSH normal,ESR 56, C-reactive protein 19.83, vitamin D 19.77, CK 31  Assessment and Plan: Pain in both hands: Clinical examination and x-rays were unremarkable at the last visit. All autoimmune work-up is negative.  All lab work was reviewed and questions were addressed. We will schedule an ultrasound of both hands to assess for synovitis.  She has been experiencing numbness in both hands, we discussed scheduling a NCV study with Dr. Ernestina Patches in the future if her symptoms persist.  She has no joint swelling at this time. She takes Celebrex 100 mg 1 capsule by mouth BID. Joint protection and muscle strengthening were discussed. A list of natural anti-inflammatories will be mailed to the patient. She was advised to notify us if she develops increased joint pain or joint swelling.  She will follow up in 3 months.   Primary osteoarthritis of both knees: Mild osteoarthritis and mild chondromalacia patella-She has pain in both knee joints. No joint swelling.  She has some difficulty climbing steps. We will send a handout of natural antiinflammatories and knee joint  exercises.    Pes planus of both feet: We discussed the importance wearing proper fitting shoes.  Myofascial pain: She has generalized muscle aches and muscle tenderness.  She has chronic fatigue and insomnia.  We discussed the  importance of regular exercise and good sleep hygiene.  She will continue taking Gabapentin as prescribed.   She would like x-rays of both shoulder joints and hip joints at her next visit since they "pop."    Other fatigue: She continues to have chronic fatigue. According to the patient, her PCP checked lab work last week.  Her iron was low and she was started on Ferrous sulfate 325 mg tablet daily. She is also on a vitamin D supplement.   Vitamin D deficiency: Vitamin D was 19.77 on 01/19/19.  Her PCP started her on prescription strength a vitamin D and a daily OTC 1,000 units daily.   Follow Up Instructions: She will follow up 3 months.  We will obtain X-rays of both hips and both shoulders at her next visit per request of the patient.  We will mail a handout of knee exercises and a list of natural anti-inflammatories.  We will schedule an ultrasound of both hands in the future.    I discussed the assessment and treatment plan with the patient. The patient was provided an opportunity to ask questions and all were answered. The patient agreed with the plan and demonstrated an understanding of the instructions.   The patient was advised to call back or seek an in-person evaluation if the symptoms worsen or if the condition fails to improve as anticipated.  I provided 22 minutes of non-face-to-face time during this encounter. Bo Merino, MD   Scribed by-  Ofilia Neas, PA-C

## 2019-03-01 ENCOUNTER — Other Ambulatory Visit: Payer: Self-pay | Admitting: Physician Assistant

## 2019-03-01 ENCOUNTER — Ambulatory Visit: Payer: 59 | Admitting: Rheumatology

## 2019-03-01 DIAGNOSIS — M797 Fibromyalgia: Secondary | ICD-10-CM

## 2019-03-09 ENCOUNTER — Other Ambulatory Visit: Payer: Self-pay | Admitting: Physician Assistant

## 2019-03-09 ENCOUNTER — Other Ambulatory Visit: Payer: Self-pay | Admitting: *Deleted

## 2019-03-09 MED ORDER — LAMOTRIGINE 100 MG PO TABS: 100.0000 mg | ORAL_TABLET | Freq: Two times a day (BID) | ORAL | 0 refills | Status: DC

## 2019-03-11 ENCOUNTER — Other Ambulatory Visit: Payer: Self-pay | Admitting: Physician Assistant

## 2019-03-13 ENCOUNTER — Other Ambulatory Visit: Payer: Self-pay | Admitting: Physician Assistant

## 2019-03-13 DIAGNOSIS — M791 Myalgia, unspecified site: Secondary | ICD-10-CM

## 2019-03-13 MED ORDER — CELECOXIB 100 MG PO CAPS: 100.0000 mg | ORAL_CAPSULE | Freq: Two times a day (BID) | ORAL | 0 refills | Status: DC

## 2019-03-13 MED ORDER — DIAZEPAM 5 MG PO TABS: 5.0000 mg | ORAL_TABLET | Freq: Two times a day (BID) | ORAL | 0 refills | Status: DC | PRN

## 2019-03-13 NOTE — Telephone Encounter (Signed)
Diazepam last rx 01/19/19 #30

## 2019-03-20 ENCOUNTER — Other Ambulatory Visit: Payer: Self-pay | Admitting: Emergency Medicine

## 2019-03-20 ENCOUNTER — Encounter: Payer: Self-pay | Admitting: Physician Assistant

## 2019-03-20 DIAGNOSIS — D509 Iron deficiency anemia, unspecified: Secondary | ICD-10-CM

## 2019-03-20 MED ORDER — FERROUS SULFATE 325 (65 FE) MG PO TABS: 325.0000 mg | ORAL_TABLET | Freq: Two times a day (BID) | ORAL | 6 refills | Status: DC

## 2019-03-21 ENCOUNTER — Encounter: Payer: Self-pay | Admitting: Physician Assistant

## 2019-03-21 ENCOUNTER — Encounter: Payer: Self-pay | Admitting: Rheumatology

## 2019-03-21 DIAGNOSIS — M79641 Pain in right hand: Secondary | ICD-10-CM

## 2019-03-21 DIAGNOSIS — M79642 Pain in left hand: Principal | ICD-10-CM

## 2019-03-22 ENCOUNTER — Encounter: Payer: Self-pay | Admitting: Adult Health

## 2019-03-22 NOTE — Telephone Encounter (Signed)
I would advise referral to hand surgery. They can do the NCV and evaluation of her hand.

## 2019-03-27 ENCOUNTER — Other Ambulatory Visit: Payer: Self-pay | Admitting: Physician Assistant

## 2019-03-27 DIAGNOSIS — M797 Fibromyalgia: Secondary | ICD-10-CM

## 2019-03-28 ENCOUNTER — Ambulatory Visit: Payer: Self-pay | Admitting: Adult Health

## 2019-04-02 ENCOUNTER — Other Ambulatory Visit: Payer: Self-pay | Admitting: Physician Assistant

## 2019-04-02 ENCOUNTER — Other Ambulatory Visit: Payer: Self-pay | Admitting: Neurology

## 2019-04-02 DIAGNOSIS — E559 Vitamin D deficiency, unspecified: Secondary | ICD-10-CM

## 2019-04-03 ENCOUNTER — Other Ambulatory Visit: Payer: Self-pay

## 2019-04-03 MED ORDER — LAMOTRIGINE 100 MG PO TABS: 100.0000 mg | ORAL_TABLET | Freq: Two times a day (BID) | ORAL | 6 refills | Status: DC

## 2019-04-04 ENCOUNTER — Ambulatory Visit: Payer: Self-pay | Admitting: Neurology

## 2019-04-05 ENCOUNTER — Encounter: Payer: Self-pay | Admitting: Physician Assistant

## 2019-04-05 MED ORDER — CELECOXIB 200 MG PO CAPS: 200.0000 mg | ORAL_CAPSULE | Freq: Two times a day (BID) | ORAL | 1 refills | Status: DC

## 2019-04-07 ENCOUNTER — Encounter: Payer: Self-pay | Admitting: Physician Assistant

## 2019-04-11 ENCOUNTER — Encounter: Payer: Self-pay | Admitting: Rheumatology

## 2019-04-13 ENCOUNTER — Encounter: Payer: Self-pay | Admitting: Physician Assistant

## 2019-04-13 DIAGNOSIS — M7918 Myalgia, other site: Secondary | ICD-10-CM

## 2019-04-19 ENCOUNTER — Other Ambulatory Visit: Payer: Self-pay

## 2019-04-19 DIAGNOSIS — M79641 Pain in right hand: Secondary | ICD-10-CM

## 2019-04-20 MED ORDER — GABAPENTIN 300 MG PO CAPS: ORAL_CAPSULE | ORAL | 1 refills | Status: DC

## 2019-04-20 NOTE — Addendum Note (Signed)
Addended by: Waldon Merl on: 04/20/2019 08:01 AM   Modules accepted: Orders

## 2019-04-25 ENCOUNTER — Encounter: Payer: No Typology Code available for payment source | Admitting: Neurology

## 2019-04-25 ENCOUNTER — Other Ambulatory Visit: Payer: Self-pay | Admitting: Physician Assistant

## 2019-04-25 DIAGNOSIS — E559 Vitamin D deficiency, unspecified: Secondary | ICD-10-CM

## 2019-05-08 ENCOUNTER — Encounter: Payer: Self-pay | Admitting: Physician Assistant

## 2019-05-09 ENCOUNTER — Other Ambulatory Visit: Payer: Self-pay | Admitting: Physician Assistant

## 2019-05-11 ENCOUNTER — Ambulatory Visit (INDEPENDENT_AMBULATORY_CARE_PROVIDER_SITE_OTHER): Payer: No Typology Code available for payment source | Admitting: Neurology

## 2019-05-11 ENCOUNTER — Other Ambulatory Visit: Payer: Self-pay

## 2019-05-11 DIAGNOSIS — G5601 Carpal tunnel syndrome, right upper limb: Secondary | ICD-10-CM

## 2019-05-11 DIAGNOSIS — M79641 Pain in right hand: Secondary | ICD-10-CM | POA: Diagnosis not present

## 2019-05-11 NOTE — Procedures (Signed)
[facility]  [address], [address]  [place], [place] [address] Tel: [phone] Fax:  [phone] Test Date:  05/11/2019  Patient: Lindsay Aguilar DOB: 12/02/1980 Physician: Nita Sickleonika , DO  Sex: Female Height: 5\' 8"  Ref Phys: Dairl PonderMatthew Weingold, MD  ID#: [id number] Temp: 35.0C Technician:    Patient Complaints: This is a 38 year old female referred for evaluation of right hand numbness and tingling.  NCV & EMG Findings: Extensive electrodiagnostic testing of the right upper extremity shows:  1. Right median sensory nerve shows prolonged distal peak latency (3.6 ms). Left median, left mixed palmar, and bilateral ulnar sensory responses are within normal limits. 2. Right median motor response shows prolonged distal onset latency (4.0 ms).  Left median and bilateral ulnar motor responses are within normal limits.   3. There is no evidence of active or chronic motor axonal loss changes affecting any of the tested muscles.  Motor unit configuration and recruitment pattern is within normal limits.    Impression: Right median neuropathy at or distal to the wrist (moderate), consistent with a clinical diagnosis of carpal tunnel syndrome.     ___________________________ Nita Sickleonika , DO    Nerve Conduction Studies Anti Sensory Summary Table   Site NR Peak (ms) Norm Peak (ms) P-T Amp (V) Norm P-T Amp  Left Median Anti Sensory (2nd Digit)  35C  Wrist    2.7 <3.4 51.8 >20  Right Median Anti Sensory (2nd Digit)  35C  Wrist    3.6 <3.4 24.6 >20  Left Ulnar Anti Sensory (5th Digit)  35C  Wrist    2.5 <3.1 41.4 >12  Right Ulnar Anti Sensory (5th Digit)  35C  Wrist    2.3 <3.1 46.2 >12   Motor Summary Table   Site NR Onset (ms) Norm Onset (ms) O-P Amp (mV) Norm O-P Amp Site1 Site2 Delta-0 (ms) Dist (cm) Vel (m/s) Norm Vel (m/s)  Left Median Motor (Abd Poll Brev)  35C  Wrist    2.9 <3.9 7.1 >6 Elbow Wrist 4.4 28.0 64 >50  Elbow    7.3  6.7         Right Median Motor  (Abd Poll Brev)  35C  Wrist    4.0 <3.9 6.8 >6 Elbow Wrist 4.7 29.0 62 >50  Elbow    8.7  6.6         Left Ulnar Motor (Abd Dig Minimi)  35C  Wrist    1.9 <3.1 7.1 >7 B Elbow Wrist 3.6 22.0 61 >50  B Elbow    5.5  6.6  A Elbow B Elbow 1.5 10.0 67 >50  A Elbow    7.0  6.3         Right Ulnar Motor (Abd Dig Minimi)  35C  Wrist    2.0 <3.1 8.2 >7 B Elbow Wrist 3.3 22.0 67 >50  B Elbow    5.3  7.7  A Elbow B Elbow 1.6 10.0 62 >50  A Elbow    6.9  7.3          Comparison Summary Table   Site NR Peak (ms) Norm Peak (ms) P-T Amp (V) Site1 Site2 Delta-P (ms) Norm Delta (ms)  Left Median/Ulnar Palm Comparison (Wrist - 8cm)  35C  Median Palm    1.7 <2.2 53.6 Median Palm Ulnar Palm 0.2   Ulnar Palm    1.5 <2.2 18.5       EMG   Side Muscle Ins Act Fibs Psw Fasc Number Recrt Dur  Dur. Amp Amp. Poly Poly. Comment  Right 1stDorInt Nml Nml Nml Nml Nml Nml Nml Nml Nml Nml Nml Nml N/A  Right Abd Poll Brev Nml Nml Nml Nml Nml Nml Nml Nml Nml Nml Nml Nml N/A  Right PronatorTeres Nml Nml Nml Nml Nml Nml Nml Nml Nml Nml Nml Nml N/A  Right Biceps Nml Nml Nml Nml Nml Nml Nml Nml Nml Nml Nml Nml N/A  Right Triceps Nml Nml Nml Nml Nml Nml Nml Nml Nml Nml Nml Nml N/A  Right Deltoid Nml Nml Nml Nml Nml Nml Nml Nml Nml Nml Nml Nml N/A      Waveforms:

## 2019-05-12 MED ORDER — DULOXETINE HCL 20 MG PO CPEP: 20.0000 mg | ORAL_CAPSULE | Freq: Every day | ORAL | 1 refills | Status: DC

## 2019-05-16 DIAGNOSIS — G5601 Carpal tunnel syndrome, right upper limb: Secondary | ICD-10-CM | POA: Insufficient documentation

## 2019-05-17 NOTE — Progress Notes (Deleted)
Office Visit Note  Patient: Lindsay Aguilar             Date of Birth: 09/14/1981           MRN: [id number]             PCP: Noel JourneyMartin, William C, PA-C Referring: Waldon MerlMartin, William C, PA-C Visit Date: 05/31/2019 Occupation: @GUAROCC @  Subjective:  No chief complaint on file.   History of Present Illness: Lindsay CristalLaura M Kohut is a 38 y.o. female ***   Activities of Daily Living:  Patient reports morning stiffness for *** {minute/hour:19697}.   Patient {ACTIONS;DENIES/REPORTS:21021675::"Denies"} nocturnal pain.  Difficulty dressing/grooming: {ACTIONS;DENIES/REPORTS:21021675::"Denies"} Difficulty climbing stairs: {ACTIONS;DENIES/REPORTS:21021675::"Denies"} Difficulty getting out of chair: {ACTIONS;DENIES/REPORTS:21021675::"Denies"} Difficulty using hands for taps, buttons, cutlery, and/or writing: {ACTIONS;DENIES/REPORTS:21021675::"Denies"}  No Rheumatology ROS completed.   PMFS History:  Patient Active Problem List   Diagnosis Date Noted  . Flat foot 02/15/2019  . Primary osteoarthritis of both knees 02/15/2019  . Myofascial pain 02/15/2019  . Vitamin D deficiency 02/01/2019  . Acute bacterial bronchitis 01/04/2019  . Obesity 03/04/2017  . Restless legs syndrome 06/05/2016  . Somnolence, daytime 02/17/2016  . Convulsions/seizures (HCC) 08/01/2015  . Prediabetes 02/15/2014  . Dyslipidemia 02/15/2014  . Chronic headaches 01/04/2014  . Anxiety and depression 01/04/2014  . Mild intermittent asthma   . Anemia     Past Medical History:  Diagnosis Date  . GERD (gastroesophageal reflux disease)   . History of CHF (congestive heart failure)    2006 during first preg. dx chf--  evaluated by cardologist (dr Melburn Poppernasher)-- resolved after birth (no issue w/ other preg's)  . Iron deficiency anemia   . Menometrorrhagia   . Nocturnal seizures [facility](HCC) neurologist-  dr sethi/ dr Anne Hahnwillis [facility]   last sz 06/14/18  . RLS (restless legs syndrome)   . Seasonal allergies   . Wears glasses      Family History  Problem Relation Age of Onset  . Anesthesia problems Other   . Depression Father   . Arthritis Mother   . High Cholesterol Mother   . Asthma Son   . Healthy Son   . Hematuria Daughter    Past Surgical History:  Procedure Laterality Date  . ABDOMINAL HYSTERECTOMY  11/21/2018  . DILITATION & CURRETTAGE/HYSTROSCOPY WITH NOVASURE ABLATION N/A 03/04/2017   Procedure: DILATATION & CURETTAGE/HYSTEROSCOPY WITH NOVASURE ABLATION;  Surgeon: Harold HedgeJames Tomblin, MD;  [facility];  Service: Gynecology;  Laterality: N/A;  . LAPAROSCOPIC CHOLECYSTECTOMY  04/2008  . LAPAROSCOPIC TUBAL LIGATION Bilateral 02/2016  . TRANSTHORACIC ECHOCARDIOGRAM  03/09/2014   ef 55-60%/  trivial MR and TR  . 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 Influenza,inj,Quad PF,6+ Mos 09/14/2014, 08/13/2017, 09/22/2018  . Influenza-Unspecified 06/20/2018  . Pneumococcal Polysaccharide-23 06/01/2011  . Tdap 06/01/2011     Objective: Vital Signs: LMP 02/12/2017 (Exact Date)    Physical Exam   Musculoskeletal Exam: ***  CDAI Exam: CDAI Score: - Patient Global: -; Provider Global: - Swollen: -; Tender: - Joint Exam  No joint exam has been documented for this visit   There is currently no information documented on the homunculus. Go to the Rheumatology activity and complete the homunculus joint exam.  Investigation: No additional findings.  Imaging: No results found.  Recent Labs: Lab Results  Component Value Date   WBC 7.8 02/13/2019   HGB 11.0 (L) 02/13/2019   PLT 295.0 02/13/2019   NA 140  02/01/2019   K 4.4 02/01/2019   CL 104 02/01/2019   CO2 29 02/01/2019   GLUCOSE 92 02/01/2019   BUN 14 02/01/2019   CREATININE 0.86 02/01/2019   BILITOT 0.3 02/01/2019   ALKPHOS 52 03/03/2017   AST 17 02/01/2019   ALT 19 02/01/2019   PROT 6.7 02/01/2019   ALBUMIN 3.8 03/03/2017   CALCIUM 9.5 02/01/2019   GFRAA 99 02/01/2019    Speciality Comments: No specialty comments available.  Procedures:  No procedures performed Allergies: Amoxicillin and Ephedrine   Assessment / Plan:     Visit Diagnoses: No diagnosis found.   Orders: No orders of the defined types were placed in this encounter.  No orders of the defined types were placed in this encounter.   Face-to-face time spent with patient was *** minutes. Greater than 50% of time was spent in counseling and coordination of care.  Follow-Up Instructions: No follow-ups on file.   Earnestine Mealing, CMA  Note - This record has been created using Editor, commissioning.  Chart creation errors have been sought, but may not always  have been located. Such creation errors do not reflect on  the standard of medical care.

## 2019-05-29 ENCOUNTER — Other Ambulatory Visit: Payer: Self-pay | Admitting: Physician Assistant

## 2019-05-31 ENCOUNTER — Ambulatory Visit: Payer: Self-pay | Admitting: Rheumatology

## 2019-06-04 ENCOUNTER — Other Ambulatory Visit: Payer: Self-pay | Admitting: Physician Assistant

## 2019-06-12 ENCOUNTER — Encounter: Payer: Self-pay | Admitting: Physician Assistant

## 2019-06-27 ENCOUNTER — Other Ambulatory Visit: Payer: Self-pay | Admitting: Emergency Medicine

## 2019-06-27 ENCOUNTER — Encounter: Payer: Self-pay | Admitting: Physician Assistant

## 2019-06-27 DIAGNOSIS — M797 Fibromyalgia: Secondary | ICD-10-CM

## 2019-06-27 DIAGNOSIS — M7918 Myalgia, other site: Secondary | ICD-10-CM

## 2019-06-27 MED ORDER — DULOXETINE HCL 20 MG PO CPEP: 20.0000 mg | ORAL_CAPSULE | Freq: Two times a day (BID) | ORAL | 1 refills | Status: DC

## 2019-06-27 MED ORDER — DULOXETINE HCL 40 MG PO CPEP: 40.0000 mg | ORAL_CAPSULE | Freq: Every day | ORAL | 1 refills | Status: DC

## 2019-07-27 ENCOUNTER — Encounter: Payer: Self-pay | Admitting: Physician Assistant

## 2019-07-27 ENCOUNTER — Other Ambulatory Visit: Payer: Self-pay | Admitting: Physician Assistant

## 2019-07-27 DIAGNOSIS — M797 Fibromyalgia: Secondary | ICD-10-CM

## 2019-07-27 DIAGNOSIS — M7918 Myalgia, other site: Secondary | ICD-10-CM

## 2019-07-28 MED ORDER — DIAZEPAM 5 MG PO TABS: 5.0000 mg | ORAL_TABLET | Freq: Two times a day (BID) | ORAL | 0 refills | Status: DC | PRN

## 2019-07-28 MED ORDER — DULOXETINE HCL 20 MG PO CPEP: 40.0000 mg | ORAL_CAPSULE | Freq: Every day | ORAL | 1 refills | Status: DC

## 2019-07-28 NOTE — Telephone Encounter (Signed)
Last refill: 4.27.20 #30, 0 Last OV: 3.30.20 dx. Weight gain and myalgia

## 2019-08-01 ENCOUNTER — Other Ambulatory Visit: Payer: Self-pay | Admitting: Physician Assistant

## 2019-08-16 ENCOUNTER — Ambulatory Visit (INDEPENDENT_AMBULATORY_CARE_PROVIDER_SITE_OTHER)
Admission: RE | Admit: 2019-08-16 | Discharge: 2019-08-16 | Disposition: A | Discharge status: Home / Self Care | Payer: No Typology Code available for payment source | Source: Ambulatory Visit

## 2019-08-16 DIAGNOSIS — B350 Tinea barbae and tinea capitis: Secondary | ICD-10-CM

## 2019-08-16 MED ORDER — FLUCONAZOLE 150 MG PO TABS: 150.0000 mg | ORAL_TABLET | Freq: Every day | ORAL | 0 refills | Status: DC

## 2019-08-16 NOTE — ED Provider Notes (Signed)
Virtual Visit via Video Note:  Lindsay Aguilar  initiated request for Telemedicine visit with [facility] Urgent Care team. I connected with Lindsay Aguilar  on 08/16/2019 at 12:55 PM  for a synchronized telemedicine visit using a video enabled HIPPA compliant telemedicine application. I verified that I am speaking with Lindsay Aguilar  using two identifiers. Lindsay Aris, NP  was physically located in a [facility] Urgent care site and Lindsay Aguilar was located at a different location.   The limitations of evaluation and management by telemedicine as well as the availability of in-person appointments were discussed. Patient was informed that she  may incur a bill ( including co-pay) for this virtual visit encounter. Lindsay Aguilar  expressed understanding and gave verbal consent to proceed with virtual visit.     History of Present Illness:Lindsay Aguilar  is a 38 y.o. female presents with dry itchy patches to scalp. This has been present and worse over the past few weeks. Some hair loss in the area. Very inflamed at times. She changed her shampoo to see if this would help. She does have a hx of eczema that is mild. Never on the scalp. Nobody else at home has the rash. No fever. Recently dx with fibromyalgia. Has had elevated CRP in the past but negative ANA.   Past Medical History:  Diagnosis Date  . GERD (gastroesophageal reflux disease)   . History of CHF (congestive heart failure)    2006 during first preg. dx chf--  evaluated by cardologist (dr Melburn Popper)-- resolved after birth (no issue w/ other preg's)  . Iron deficiency anemia   . Menometrorrhagia   . Nocturnal seizures [facility]) neurologist-  dr Aguilar/ dr Lindsay Aguilar [facility] neurolog[facility])   last sz 06/14/18  . RLS (restless legs syndrome)   . Seasonal allergies   . Wears glasses     Allergies  Allergen Reactions  . Amoxicillin Other (See Comments)    Yeast infection  . Ephedrine Other (See Comments)    Avoids due to possible cause of  seizure        Observations/Objective:VITALS: Per patient if applicable, see vitals. GENERAL: Alert, appears well and in no acute distress. HEENT: Atraumatic, conjunctiva clear, no obvious abnormalities on inspection of external nose and ears. NECK: Normal movements of the head and neck. CARDIOPULMONARY: No increased WOB. Speaking in clear sentences. I:E ratio WNL.  MS: Moves all visible extremities without noticeable abnormality. PSYCH: Pleasant and cooperative, well-groomed. Speech normal rate and rhythm. Affect is appropriate. Insight and judgement are appropriate. Attention is focused, linear, and appropriate.  NEURO: CN grossly intact. Oriented as arrived to appointment on time with no prompting. Moves both UE equally.  SKIN: scalp with scaly erythematous  areas shown on camera. Some hair loss.     Assessment and Plan: tinea capitus vs seborrheic dermatitis vs lupus rash, opted to treat with diflucan daily for 3 weeks. Was going to add on prednisone in adjunct but pt wants to wait. This is reasonable     Follow Up Instructions:recommended follow up  In person for continued symptoms.     I discussed the assessment and treatment plan with the patient. The patient was provided an opportunity to ask questions and all were answered. The patient agreed with the plan and demonstrated an understanding of the instructions.   The patient was advised to call back or seek an in-person evaluation if the symptoms worsen or if the condition fails to improve as  anticipated.      Lindsay July, NP  08/16/2019 12:55 PM         Lindsay July, NP 08/16/19 1556

## 2019-08-16 NOTE — Discharge Instructions (Addendum)
Take the medication as prescribed  1 tab daily for 3 weeks.  Follow up as needed for continued or worsening symptoms

## 2019-09-21 ENCOUNTER — Encounter: Payer: Self-pay | Admitting: Adult Health

## 2019-09-21 ENCOUNTER — Other Ambulatory Visit: Payer: Self-pay

## 2019-09-21 ENCOUNTER — Ambulatory Visit (INDEPENDENT_AMBULATORY_CARE_PROVIDER_SITE_OTHER): Payer: No Typology Code available for payment source | Admitting: Adult Health

## 2019-09-21 VITALS — BP 129/88 | HR 93 | Temp 97.0°F | Ht 66.0 in | Wt 286.2 lb

## 2019-09-21 DIAGNOSIS — G40909 Epilepsy, unspecified, not intractable, without status epilepticus: Secondary | ICD-10-CM | POA: Diagnosis not present

## 2019-09-21 DIAGNOSIS — Z79899 Other long term (current) drug therapy: Secondary | ICD-10-CM

## 2019-09-21 DIAGNOSIS — G43709 Chronic migraine without aura, not intractable, without status migrainosus: Secondary | ICD-10-CM

## 2019-09-21 MED ORDER — TOPIRAMATE 25 MG PO TABS: 25.0000 mg | ORAL_TABLET | Freq: Two times a day (BID) | ORAL | 2 refills | Status: DC

## 2019-09-21 NOTE — Progress Notes (Signed)
[facility]  PATIENT: Lindsay Aguilar DOB: 08/11/1981   REASON FOR VISIT:  seizure disorder with recent seizure , restless leg syndrome, and recurrent migraines HISTORY FROM: Patient    HISTORY OF PRESENT ILLNESS: HISTORY PSMs Lindsay Aguilar is a 38 year old pleasant Caucasian lady who 3 days ago on Monday night had an episode of witnessed seizure in sleep. She is unable to describe the episode and the husband was an eyewitness.. The patient and husband went to bed at the usual time. Her daughter who sleeps with them woke up and shouted.at about 2 AM which woke up the husband who noticed that his wife had tonic posturing of her upper extremities with tremulousness. Her mouth was closed. She had tongue bite. She had incontinence of urine. The patient stopped shaking after 3-5 minutes. She appeared to be postictal with transient disorientation and confusion. She recalls the EMS arriving to the house.and her disorientation started clearing by the time she reached the hospital. She had noncontrast CT scan of the head done on 08/16/2015 which I personally reviewed and is unremarkable except for evidence of incidental sinusitis. Basic metabolic panel labs and CBC were unremarkable. Patient had started a cold medication on that day which include atenolol, guaifenesin and phenylephrine. She has no known prior history of seizures, significant head injury with loss of consciousness, childhood meningitis, neurological illness. She denies drinking or recreational abuse drugs or drinking significant amounts of alcohol. There is no family history of epilepsy either. She feels back to baseline and has no complaints today. She denies sleep deprivation, significant stress, irregular eating and sleeping habits. She does not drink excessive amounts of caffeine Update 10/22/2015 ; PSShe returns for follow-up after initial consultation visit 2 and half months ago. She continues to do well without recurrent  seizure episodes. She had an MRI scan of the brain done on 08/19/15 which I personally reviewed and is normal without any structural abnormalities in the brain. EEG done on 08/22/15 is normal without definite epileptiform activity. Patient states she is not had any further seizure-like episodes or any other neurological complaints. She is keen to start driving again soon UPDATE 04/03/2017CM Ms Lindsay Aguilar, 38 year old female returns for follow-up. She has a history of 2 seizure events since September both occurred while sleeping. Her most recent seizure on 02/12/16 occurred while she was napping in the afternoon with her daughter. She had some generalized shaking of her extremities that lasted approximately 3-4 minutes with loss of consciousness and incontinence of urine. She is markedly obese and may have sleep apnea. After her most recent event she needs anticonvulsant medications. MRI of the brain in the past has been normal. EEG has been normal UPDATE 07/21/2017CM. [name] 38 year old female returns for follow-up. She was last seen in April and had 2 previous seizure events. She was placed on Keppra at her last visit and asked to get a sleep study . 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 She was advised to lose weight and exercise. In addition there were frequent periodic limb movements of sleep with associated sleep disruption. Will check ferritin level and iron studies. She has had a low hemoglobin in the past. She returns for reevaluation. She has not had further seizure events. She was made aware she still cannot drive for 3 months since her last seizure was 02/12/2016. UPDATE 10/04/2018CM [name], 38 year old female returns for follow-up with history of seizure disorder currently  on Keppra with side effects of acid reflux. She has been placed on Prilosec. Last seizure event was in April 2017. She has a  history of restless legs confirmed by sleep study .Her iron studies and ferritin have been normal. She remains overweight as been encouraged to lose weight and exercise. She is currently going through a divorce which she reportedly claims is very stressful for her particularly under the circumstances which it occurred. She caught  her husband watching her sister take a shower. She returns for reevaluation. She had an endometrial ablation in April 2018 She is wanting to be placed on Valium for her anxiety however she was advised to follow-up with her primary care for that . Valium is a controlled substance. UPDATE 7/31/2019CM [name], 38 year old female returns for follow-up with history of seizure disorder currently on Keppra and has had side effects of acid reflux in the past.  She reports in May she felt like she was going to have a seizure so she increased her dose on her own without calling us.  She increased it to 1-1/2 tabs in the morning and 1 tab at night of the 500 mg.  She had a seizure last night getting out of the car.  Her significant other helped her to the ground it just lasted moments no apparent injury except some scrapes on her feet and hands.  Significant other reports that since she increased the dose back in May she has been more irritable and agitated.  Prior to this last seizure was in April 2017.  She returns for reevaluation Update 09/20/2018 CM: [name], 38 year old female returns for follow-up with history of seizure disorder.  Her last seizure was 06/14/2018.  She was on Keppra at the time and she had increased her dose on her own and became much more irritable and agitated.  She also had acid reflux on Keppra.  After her last visit she made a slow switch to Lamictal and titrated off of Keppra due to side effects she returns today feeling much better.  She also has history of restless legs and is on Requip.  She was advised to stop her Prilosec now for acid reflux from the  [place].  She returns for reevaluation  Update 09/21/2019: [name] is a 38 year old female who is being seen today for seizure disorder follow-up.  She has been doing well on Lamictal 100 mg twice daily without side effects.  She has not had any seizure activity.  She does endorse worsening migraines with prior history of migraines when she was younger but have been slowly worsening since her hysterectomy.  Over the past few months, she has been experiencing migraines approximately once weekly that are located right fronto-parietal region associated with photophobia, phonophobia and nausea. Typically last hrs to 1 day but recent migraine has been present for the past 3 days.  She has used Tylenol and ibuprofen which typically provides relief but has not improved current migraine.  She does endorse being under increased stress at home recently and questions whether current headache is stress related.  She has not previously been on migraine preventatives or abortive medications.  No further concerns at this time.     REVIEW OF SYSTEMS: Full 14 system review of systems performed and notable only for those listed, all others are neg:  Constitutional: neg  Cardiovascular: neg Ear/Nose/Throat: neg  Skin: neg Eyes: neg Respiratory: neg Gastroitestinal: neg  Hematology/Lymphatic:anemia Endocrine: neg Musculoskeletal:neg Allergy/Immunology: neg Neurological:  seizure disorder last seizure  06/14/2018 psychiatric: neg Sleep : Restless legs   ALLERGIES: Allergies  Allergen Reactions   Amoxicillin Other (See Comments)    Yeast infection   Ephedrine Other (See Comments)    Avoids due to possible cause of seizure    HOME MEDICATIONS: Outpatient Medications Prior to Visit  Medication Sig Dispense Refill   Acetaminophen (TYLENOL PO) Take by mouth as needed.     celecoxib (CELEBREX) 200 MG capsule TAKE 1 CAPSULE BY MOUTH TWICE A DAY 60 capsule 1   Cholecalciferol (VITAMIN D-3) 25 MCG (1000  UT) CAPS Take 1 capsule (1,000 Units total) by mouth daily. 30 capsule 0   diazepam (VALIUM) 5 MG tablet Take 1 tablet (5 mg total) by mouth every 12 (twelve) hours as needed. 30 tablet 0   DULoxetine (CYMBALTA) 20 MG capsule Take 2 capsules (40 mg total) by mouth daily. 180 capsule 1   ferrous sulfate 325 (65 FE) MG tablet Take 1 tablet (325 mg total) by mouth 2 (two) times daily with a meal. 60 tablet 6   lamoTRIgine (LAMICTAL) 100 MG tablet Take 1 tablet (100 mg total) by mouth 2 (two) times daily. 60 tablet 6   omeprazole (PRILOSEC) 20 MG capsule TAKE 1 CAPSULE BY MOUTH EVERY DAY     rOPINIRole (REQUIP) 0.25 MG tablet TAKE 1 TABLET(0.25 MG) BY MOUTH AT BEDTIME 30 tablet 11   Bioflavonoid Products (ESTER C PO) Take by mouth daily.     fluconazole (DIFLUCAN) 150 MG tablet Take 1 tablet (150 mg total) by mouth daily. 21 tablet 0   Vitamin D, Ergocalciferol, (DRISDOL) 1.25 MG (50000 UT) CAPS capsule Take 1 capsule by mouth weekly for 12 weeks 4 capsule 2   No facility-administered medications prior to visit.     PAST MEDICAL HISTORY: Past Medical History:  Diagnosis Date   GERD (gastroesophageal reflux disease)    History of CHF (congestive heart failure)    2006 during first preg. dx chf--  evaluated by cardologist (dr Melburn Popper)-- resolved after birth (no issue w/ other preg's)   Iron deficiency anemia    Menometrorrhagia    Nocturnal seizures [facility]) neurologist-  dr sethi/ dr Anne Hahn [facility])   last sz 06/14/18   RLS (restless legs syndrome)    Seasonal allergies    Wears glasses     PAST SURGICAL HISTORY: Past Surgical History:  Procedure Laterality Date   ABDOMINAL HYSTERECTOMY  11/21/2018   DILITATION & CURRETTAGE/HYSTROSCOPY WITH NOVASURE ABLATION N/A 03/04/2017   Procedure: DILATATION & CURETTAGE/HYSTEROSCOPY WITH NOVASURE ABLATION;  Surgeon: Harold Hedge, MD;  Location: [facility];  Service: Gynecology;  Laterality: N/A;    LAPAROSCOPIC CHOLECYSTECTOMY  04/2008   LAPAROSCOPIC TUBAL LIGATION Bilateral 02/2016   TRANSTHORACIC ECHOCARDIOGRAM  03/09/2014   ef 55-60%/  trivial MR and TR   WISDOM TOOTH EXTRACTION  age 13    FAMILY HISTORY: Family History  Problem Relation Age of Onset   Anesthesia problems Other    Depression Father    Arthritis Mother    High Cholesterol Mother    Asthma Son    Healthy Son    Hematuria Daughter     SOCIAL HISTORY: Social History   Social History   Marital status: Separated    Spouse name: N/A   Number of children: N/A   Years of education: N/A   Occupational History   Not on file.   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  General: well developed, well nourished,  pleasant middle-age Caucasian female, seated, in no evident distress Head: head normocephalic and atraumatic.   Neck: supple with no carotid or supraclavicular bruits Cardiovascular: regular rate and rhythm, no murmurs Musculoskeletal: no deformity Skin:  no rash/petichiae Vascular:  Normal pulses all extremities   Neurologic Exam Mental Status: Awake and fully alert. Oriented to  place and time. Recent and remote memory intact. Attention span, concentration and fund of knowledge appropriate. Mood and affect appropriate.  Cranial Nerves: Pupils equal, briskly reactive to light. Extraocular movements full without nystagmus. Visual fields full to confrontation. Hearing intact. Facial sensation intact. Face, tongue, palate moves normally and symmetrically.  Motor: Normal bulk and tone. Normal strength in all tested extremity muscles. Sensory.: intact to touch , pinprick , position and vibratory sensation.  Coordination: Rapid alternating movements normal in all extremities. Finger-to-nose and heel-to-shin performed accurately bilaterally. Gait and Station: Arises from chair without difficulty. Stance is normal. Gait demonstrates normal stride length and balance Reflexes: 1+ and symmetric. Toes downgoing.      DIAGNOSTIC DATA (LABS, IMAGING, TESTING) - I reviewed patient records, labs, notes, testing and imaging myself where available.  Lab Results  Component Value Date   WBC 7.8 02/13/2019   HGB 11.0 (L) 02/13/2019   HCT 34.1 (L) 02/13/2019   MCV 72.9 (L) 02/13/2019   PLT 295.0 02/13/2019      Component Value Date/Time   NA 140 02/01/2019 0950   K 4.4 02/01/2019 0950   CL 104 02/01/2019 0950   CO2 29 02/01/2019 0950   GLUCOSE 92 02/01/2019 0950   BUN 14 02/01/2019 0950   CREATININE 0.86 02/01/2019 0950   CALCIUM 9.5 02/01/2019 0950   PROT 6.7 02/01/2019 0950   ALBUMIN 3.8 03/03/2017 1159   AST 17 02/01/2019 0950   ALT 19 02/01/2019 0950   ALKPHOS 52 03/03/2017 1159   BILITOT 0.3 02/01/2019 0950   GFRNONAA 86 02/01/2019 0950   GFRAA 99 02/01/2019 0950   Lab Results  Component Value Date   CHOL 228 (H) 01/19/2019   HDL 50.50 01/19/2019   LDLCALC 161 (H) 01/19/2019   LDLDIRECT 160.6 01/04/2014   TRIG 82.0 01/19/2019   CHOLHDL 5 01/19/2019   Lab Results  Component Value Date   HGBA1C 5.9 01/19/2019    ASSESSMENT AND PLAN 38 y.o. year old  female has a past medical history of 2 separate seizure events  while sleeping and one seizure event during  dosage adjustment.  She is now more agitated and irritable on the Keppra.. She is morbidly obese.  MRI of the brain in the past has been normal. EEG has been normal most recent event occurred on 06/14/18. . 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 She was advised to lose weight and exercise.  Due to potential side effects, she was successfully titrated off of Keppra and titrated on Lamictal twice daily.  Over the past 6 months, she has been experiencing weekly migraine headaches with prior history of migraines with recent migraine similar in characteristic but worsening since her hysterectomy.  Initiate Topamax 25 mg twice daily  No location for imaging at this time as current migraines similar to prior migraines and likely worsening due to hysterectomy and menopause Continue  Lamictal 100 mg twice daily for seizure prophylaxis Lamotrigine level obtained to ensure therapeutic level Continue Requip for restless legs Call for any seizure activity  Follow-up in 3 months or call earlier if needed  Greater than 50% of this 25-minute visit was spent discussing history of seizures with ongoing use of Lamictal and worsening of migraine headaches and initiation of Topamax with potential side effects discussed.  Answered all questions to patient satisfaction.  Ihor Austin, AGNP-BC  [facility] [address] [place], [place] [address]  Phone [phone] Fax [phone] Note: This document was prepared with digital dictation and possible smart phrase technology. Any transcriptional errors that result from this process are unintentional.

## 2019-09-21 NOTE — Patient Instructions (Addendum)
Your Plan:   Continue Lamictal for seizure prevention  We will check Lamictal levels today and results will be sent to your MyChart  Initiate Topamax 25 mg twice daily for migraine management    Follow-up in 3 months or call earlier if needed     Thank you for coming to see [place] at [facility]. I hope we have been able to provide you high quality care today.  You may receive a patient satisfaction survey over the next few weeks. We would appreciate your feedback and comments so that we may continue to improve ourselves and the health of our patients.     Topiramate tablets What is this medicine? TOPIRAMATE (toe PYRE a mate) is used to treat seizures in adults or children with epilepsy. It is also used for the prevention of migraine headaches. This medicine may be used for other purposes; ask your health care provider or pharmacist if you have questions. COMMON BRAND NAME(S): Topamax, Topiragen What should I tell my health care provider before I take this medicine? They need to know if you have any of these conditions:  bleeding disorders  cirrhosis of the liver or liver disease  diarrhea  glaucoma  kidney stones or kidney disease  low blood counts, like low white cell, platelet, or red cell counts  lung disease like asthma, obstructive pulmonary disease, emphysema  metabolic acidosis  on a ketogenic diet  schedule for surgery or a procedure  suicidal thoughts, plans, or attempt; a previous suicide attempt by you or a family member  an unusual or allergic reaction to topiramate, other medicines, foods, dyes, or preservatives  pregnant or trying to get pregnant  breast-feeding How should I use this medicine? Take this medicine by mouth with a glass of water. Follow the directions on the prescription label. Do not crush or chew. You may take this medicine with meals. Take your medicine at regular intervals. Do not take it more often than  directed. Talk to your pediatrician regarding the use of this medicine in children. Special care may be needed. While this drug may be prescribed for children as young as 32 years of age for selected conditions, precautions do apply. Overdosage: If you think you have taken too much of this medicine contact a poison control center or emergency room at once. NOTE: This medicine is only for you. Do not share this medicine with others. What if I miss a dose? If you miss a dose, take it as soon as you can. If your next dose is to be taken in less than 6 hours, then do not take the missed dose. Take the next dose at your regular time. Do not take double or extra doses. What may interact with this medicine? Do not take this medicine with any of the following medications:  probenecid This medicine may also interact with the following medications:  acetazolamide  alcohol  amitriptyline  aspirin and aspirin-like medicines  birth control pills  certain medicines for depression  certain medicines for seizures  certain medicines that treat or prevent blood clots like warfarin, enoxaparin, dalteparin, apixaban, dabigatran, and rivaroxaban  digoxin  hydrochlorothiazide  lithium  medicines for pain, sleep, or muscle relaxation  metformin  methazolamide  NSAIDS, medicines for pain and inflammation, like ibuprofen or naproxen  pioglitazone  risperidone This list may not describe all possible interactions. Give your health care provider a list of all the medicines, herbs, non-prescription drugs, or dietary supplements you use. Also tell them if  you smoke, drink alcohol, or use illegal drugs. Some items may interact with your medicine. What should I watch for while using this medicine? Visit your doctor or health care professional for regular checks on your progress. Do not stop taking this medicine suddenly. This increases the risk of seizures if you are using this medicine to control  epilepsy. Wear a medical identification bracelet or chain to say you have epilepsy or seizures, and carry a card that lists all your medicines. This medicine can decrease sweating and increase your body temperature. Watch for signs of deceased sweating or fever, especially in children. Avoid extreme heat, hot baths, and saunas. Be careful about exercising, especially in hot weather. Contact your health care provider right away if you notice a fever or decrease in sweating. You should drink plenty of fluids while taking this medicine. If you have had kidney stones in the past, this will help to reduce your chances of forming kidney stones. If you have stomach pain, with nausea or vomiting and yellowing of your eyes or skin, call your doctor immediately. You may get drowsy, dizzy, or have blurred vision. Do not drive, use machinery, or do anything that needs mental alertness until you know how this medicine affects you. To reduce dizziness, do not sit or stand up quickly, especially if you are an older patient. Alcohol can increase drowsiness and dizziness. Avoid alcoholic drinks. If you notice blurred vision, eye pain, or other eye problems, seek medical attention at once for an eye exam. The use of this medicine may increase the chance of suicidal thoughts or actions. Pay special attention to how you are responding while on this medicine. Any worsening of mood, or thoughts of suicide or dying should be reported to your health care professional right away. This medicine may increase the chance of developing metabolic acidosis. If left untreated, this can cause kidney stones, bone disease, or slowed growth in children. Symptoms include breathing fast, fatigue, loss of appetite, irregular heartbeat, or loss of consciousness. Call your doctor immediately if you experience any of these side effects. Also, tell your doctor about any surgery you plan on having while taking this medicine since this may increase your  risk for metabolic acidosis. Birth control pills may not work properly while you are taking this medicine. Talk to your doctor about using an extra method of birth control. Women who become pregnant while using this medicine may enroll in the [place] Pregnancy Registry by calling [id number]. This registry collects information about the safety of antiepileptic drug use during pregnancy. What side effects may I notice from receiving this medicine? Side effects that you should report to your doctor or health care professional as soon as possible:  allergic reactions like skin rash, itching or hives, swelling of the face, lips, or tongue  decreased sweating and/or rise in body temperature  depression  difficulty breathing, fast or irregular breathing patterns  difficulty speaking  difficulty walking or controlling muscle movements  hearing impairment  redness, blistering, peeling or loosening of the skin, including inside the mouth  tingling, pain or numbness in the hands or feet  unusual bleeding or bruising  unusually weak or tired  worsening of mood, thoughts or actions of suicide or dying Side effects that usually do not require medical attention (report to your doctor or health care professional if they continue or are bothersome):  altered taste  back pain, joint or muscle aches and pains  diarrhea, or constipation  headache  loss of appetite  nausea  stomach upset, indigestion  tremors This list may not describe all possible side effects. Call your doctor for medical advice about side effects. You may report side effects to FDA at 1-800-FDA-1088. Where should I keep my medicine? Keep out of the reach of children. Store at room temperature between 15 and 30 degrees C (59 and 86 degrees F) in a tightly closed container. Protect from moisture. Throw away any unused medicine after the expiration date. NOTE: This sheet is a summary. It may  not cover all possible information. If you have questions about this medicine, talk to your doctor, pharmacist, or health care provider.  2020 Elsevier/Gold Standard (2013-11-06 23:17:57)

## 2019-09-22 LAB — LAMOTRIGINE LEVEL: Lamotrigine Lvl: 4.8 ug/mL (ref 2.0–20.0)

## 2019-09-25 NOTE — Progress Notes (Signed)
I agree with the above plan 

## 2019-09-27 ENCOUNTER — Encounter: Payer: Self-pay | Admitting: Adult Health

## 2019-09-27 MED ORDER — TOPIRAMATE 50 MG PO TABS: 50.0000 mg | ORAL_TABLET | Freq: Two times a day (BID) | ORAL | 3 refills | Status: DC

## 2019-10-04 ENCOUNTER — Encounter: Payer: Self-pay | Admitting: Adult Health

## 2019-10-05 MED ORDER — ROPINIROLE HCL 0.25 MG PO TABS: 0.2500 mg | ORAL_TABLET | Freq: Every day | ORAL | 6 refills | Status: DC

## 2019-10-10 ENCOUNTER — Encounter: Payer: Self-pay | Admitting: Physician Assistant

## 2019-10-10 MED ORDER — OMEPRAZOLE 20 MG PO CPDR: DELAYED_RELEASE_CAPSULE | ORAL | 1 refills | Status: DC

## 2019-10-10 MED ORDER — CELECOXIB 200 MG PO CAPS: 200.0000 mg | ORAL_CAPSULE | Freq: Two times a day (BID) | ORAL | 1 refills | Status: DC

## 2019-10-12 ENCOUNTER — Other Ambulatory Visit: Payer: Self-pay | Admitting: Physician Assistant

## 2019-10-12 DIAGNOSIS — D509 Iron deficiency anemia, unspecified: Secondary | ICD-10-CM

## 2019-10-13 ENCOUNTER — Encounter: Payer: Self-pay | Admitting: Adult Health

## 2019-10-16 MED ORDER — TOPIRAMATE 50 MG PO TABS: 75.0000 mg | ORAL_TABLET | Freq: Two times a day (BID) | ORAL | 3 refills | Status: DC

## 2019-11-03 ENCOUNTER — Other Ambulatory Visit: Payer: Self-pay | Admitting: Physician Assistant

## 2019-11-03 NOTE — Telephone Encounter (Signed)
Last OV 02/13/19 Diazepam last filled 07/28/19 #30 with 0

## 2019-11-05 ENCOUNTER — Other Ambulatory Visit: Payer: Self-pay | Admitting: Neurology

## 2019-11-06 ENCOUNTER — Other Ambulatory Visit: Payer: Self-pay

## 2019-11-06 MED ORDER — LAMOTRIGINE 100 MG PO TABS: 100.0000 mg | ORAL_TABLET | Freq: Two times a day (BID) | ORAL | 3 refills | Status: DC

## 2019-11-20 ENCOUNTER — Encounter: Payer: Self-pay | Admitting: Physician Assistant

## 2019-11-20 NOTE — Telephone Encounter (Signed)
Diazepam last rx 07/28/19 #30 LOV: 02/13/19

## 2019-11-22 MED ORDER — DIAZEPAM 5 MG PO TABS: 5.0000 mg | ORAL_TABLET | Freq: Two times a day (BID) | ORAL | 0 refills | Status: DC | PRN

## 2019-11-22 MED ORDER — CELECOXIB 200 MG PO CAPS: 200.0000 mg | ORAL_CAPSULE | Freq: Two times a day (BID) | ORAL | 1 refills | Status: DC

## 2019-11-22 NOTE — Addendum Note (Signed)
Addended by: Waldon Merl on: 11/22/2019 02:12 PM   Modules accepted: Orders

## 2019-11-22 NOTE — Telephone Encounter (Signed)
Pt called again about refill request for celebrex and diazepam. Really needs the celebrex as she is having inflammation. [facility] [address]

## 2019-11-22 NOTE — Telephone Encounter (Signed)
She has been made aware of refills but that appt is required before further fills of Valium will be given.

## 2019-11-23 ENCOUNTER — Other Ambulatory Visit: Payer: Self-pay | Admitting: Emergency Medicine

## 2019-11-23 MED ORDER — CELECOXIB 200 MG PO CAPS: 200.0000 mg | ORAL_CAPSULE | Freq: Two times a day (BID) | ORAL | 1 refills | Status: DC

## 2019-12-24 ENCOUNTER — Encounter: Payer: Self-pay | Admitting: Adult Health

## 2019-12-26 ENCOUNTER — Telehealth (INDEPENDENT_AMBULATORY_CARE_PROVIDER_SITE_OTHER): Payer: No Typology Code available for payment source | Admitting: Adult Health

## 2019-12-26 ENCOUNTER — Encounter: Payer: Self-pay | Admitting: Adult Health

## 2019-12-26 DIAGNOSIS — G2581 Restless legs syndrome: Secondary | ICD-10-CM

## 2019-12-26 DIAGNOSIS — G40909 Epilepsy, unspecified, not intractable, without status epilepticus: Secondary | ICD-10-CM | POA: Diagnosis not present

## 2019-12-26 DIAGNOSIS — G43709 Chronic migraine without aura, not intractable, without status migrainosus: Secondary | ICD-10-CM

## 2019-12-26 MED ORDER — TOPIRAMATE 25 MG PO TABS: 25.0000 mg | ORAL_TABLET | Freq: Two times a day (BID) | ORAL | 5 refills | Status: DC

## 2019-12-26 MED ORDER — LAMOTRIGINE 100 MG PO TABS: 100.0000 mg | ORAL_TABLET | Freq: Two times a day (BID) | ORAL | 3 refills | Status: DC

## 2019-12-26 MED ORDER — TOPIRAMATE 50 MG PO TABS: 50.0000 mg | ORAL_TABLET | Freq: Two times a day (BID) | ORAL | 5 refills | Status: DC

## 2019-12-26 NOTE — Progress Notes (Signed)
I agree with the above plan 

## 2019-12-26 NOTE — Progress Notes (Addendum)
[facility]  PATIENT: Lindsay Aguilar DOB: [birth year]   REASON FOR VISIT:  seizure disorder with recent seizure , restless leg syndrome, and recurrent migraines HISTORY FROM: Patient  Virtual Visit via Video Note  I connected with VEEDA VIRGO on 12/26/19 at  3:45 PM EST by a video enabled telemedicine application located at [facility] and verified that I am speaking with the correct person using two identifiers who was located at their own home.   I discussed the limitations of evaluation and management by telemedicine and the availability of in person appointments. The patient expressed understanding and agreed to proceed.    HISTORY OF PRESENT ILLNESS: HISTORY PSMs Lindsay Aguilar is a 39 year old pleasant Caucasian lady who 3 days ago on Monday night had an episode of witnessed seizure in sleep. She is unable to describe the episode and the husband was an eyewitness.. The patient and husband went to bed at the usual time. Her daughter who sleeps with them woke up and shouted.at about 2 AM which woke up the husband who noticed that his wife had tonic posturing of her upper extremities with tremulousness. Her mouth was closed. She had tongue bite. She had incontinence of urine. The patient stopped shaking after 3-5 minutes. She appeared to be postictal with transient disorientation and confusion. She recalls the EMS arriving to the house.and her disorientation started clearing by the time she reached the hospital. She had noncontrast CT scan of the head done on 08/16/2015 which I personally reviewed and is unremarkable except for evidence of incidental sinusitis. Basic metabolic panel labs and CBC were unremarkable. Patient had started a cold medication on that day which include atenolol, guaifenesin and phenylephrine. She has no known prior history of seizures, significant head injury with loss of consciousness, childhood meningitis, neurological illness.  She denies drinking or recreational abuse drugs or drinking significant amounts of alcohol. There is no family history of epilepsy either. She feels back to baseline and has no complaints today. She denies sleep deprivation, significant stress, irregular eating and sleeping habits. She does not drink excessive amounts of caffeine Update 10/22/2015 ; PSShe returns for follow-up after initial consultation visit 2 and half months ago. She continues to do well without recurrent seizure episodes. She had an MRI scan of the brain done on 08/19/15 which I personally reviewed and is normal without any structural abnormalities in the brain. EEG done on 08/22/15 is normal without definite epileptiform activity. Patient states she is not had any further seizure-like episodes or any other neurological complaints. She is keen to start driving again soon UPDATE 04/03/2017CM Ms Lindsay Aguilar, 39 year old female returns for follow-up. She has a history of 2 seizure events since September both occurred while sleeping. Her most recent seizure on 02/12/16 occurred while she was napping in the afternoon with her daughter. She had some generalized shaking of her extremities that lasted approximately 3-4 minutes with loss of consciousness and incontinence of urine. She is markedly obese and may have sleep apnea. After her most recent event she needs anticonvulsant medications. MRI of the brain in the past has been normal. EEG has been normal UPDATE 07/21/2017CM. [name] 39 year old female returns for follow-up. She was last seen in April and had 2 previous seizure events. She was placed on Keppra at her last visit and asked to get a sleep study . 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 She was advised  to lose weight and exercise. In addition there were frequent periodic limb movements of sleep with associated sleep disruption. Will check  ferritin level and iron studies. She has had a low hemoglobin in the past. She returns for reevaluation. She has not had further seizure events. She was made aware she still cannot drive for 3 months since her last seizure was 02/12/2016. UPDATE 10/04/2018CM [name], 39 year old female returns for follow-up with history of seizure disorder currently on Keppra with side effects of acid reflux. She has been placed on Prilosec. Last seizure event was in April 2017. She has a history of restless legs confirmed by sleep study .Her iron studies and ferritin have been normal. She remains overweight as been encouraged to lose weight and exercise. She is currently going through a divorce which she reportedly claims is very stressful for her particularly under the circumstances which it occurred. She caught  her husband watching her sister take a shower. She returns for reevaluation. She had an endometrial ablation in April 2018 She is wanting to be placed on Valium for her anxiety however she was advised to follow-up with her primary care for that . Valium is a controlled substance. UPDATE 7/31/2019CM [name], 39 year old female returns for follow-up with history of seizure disorder currently on Keppra and has had side effects of acid reflux in the past.  She reports in May she felt like she was going to have a seizure so she increased her dose on her own without calling us.  She increased it to 1-1/2 tabs in the morning and 1 tab at night of the 500 mg.  She had a seizure last night getting out of the car.  Her significant other helped her to the ground it just lasted moments no apparent injury except some scrapes on her feet and hands.  Significant other reports that since she increased the dose back in May she has been more irritable and agitated.  Prior to this last seizure was in April 2017.  She returns for reevaluation Update 09/20/2018 CM: [name], 39 year old female returns for follow-up with  history of seizure disorder.  Her last seizure was 06/14/2018.  She was on Keppra at the time and she had increased her dose on her own and became much more irritable and agitated.  She also had acid reflux on Keppra.  After her last visit she made a slow switch to Lamictal and titrated off of Keppra due to side effects she returns today feeling much better.  She also has history of restless legs and is on Requip.  She was advised to stop her Prilosec now for acid reflux from the [place].  She returns for reevaluation  Update 09/21/2019: Ms. Stegemann is a 39 year old female who is being seen today for seizure disorder follow-up.  She has been doing well on Lamictal 100 mg twice daily without side effects.  She has not had any seizure activity.  She does endorse worsening migraines with prior history of migraines when she was younger but have been slowly worsening since her hysterectomy.  Over the past few months, she has been experiencing migraines approximately once weekly that are located right fronto-parietal region associated with photophobia, phonophobia and nausea. Typically last hrs to 1 day but recent migraine has been present for the past 3 days.  She has used Tylenol and ibuprofen which typically provides relief but has not improved current migraine.  She does endorse being under increased stress at home recently and questions whether  current headache is stress related.  She has not previously been on migraine preventatives or abortive medications.  No further concerns at this time.  Virtual visit 12/26/2019: Ms. Metzner is a 39 year old female who is being seen today via virtual visit for seizure migraine follow-up.  Migraines have greatly improved with ongoing use of Topamax 75 mg twice daily with only occasional residual headaches.  Continues on Lamictal 100 mg twice daily without seizure activity.  Continues on Requip 0.25 mg nightly for RLS with ongoing benefit.  She has been stable and has no concerns  at today's visit.       REVIEW OF SYSTEMS: Full 14 system review of systems performed and notable only for those listed, all others are neg:  Constitutional: neg  Cardiovascular: neg Ear/Nose/Throat: neg  Skin: neg Eyes: neg Respiratory: neg Gastroitestinal: neg  Hematology/Lymphatic: neg Endocrine: neg Musculoskeletal:neg Allergy/Immunology: neg Neurological:  seizure disorder last seizure 06/14/2018, migraines psychiatric: neg Sleep : Restless legs   ALLERGIES: Allergies  Allergen Reactions  . Amoxicillin Other (See Comments)    Yeast infection  . Ephedrine Other (See Comments)    Avoids due to possible cause of seizure    HOME MEDICATIONS: Outpatient Medications Prior to Visit  Medication Sig Dispense Refill  . Acetaminophen (TYLENOL PO) Take by mouth as needed.    . celecoxib (CELEBREX) 200 MG capsule Take 1 capsule (200 mg total) by mouth 2 (two) times daily. 180 capsule 1  . Cholecalciferol (VITAMIN D-3) 25 MCG (1000 UT) CAPS Take 1 capsule (1,000 Units total) by mouth daily. 30 capsule 0  . diazepam (VALIUM) 5 MG tablet Take 1 tablet (5 mg total) by mouth every 12 (twelve) hours as needed. 30 tablet 0  . DULoxetine (CYMBALTA) 20 MG capsule Take 2 capsules (40 mg total) by mouth daily. 180 capsule 1  . ferrous sulfate 325 (65 FE) MG tablet TAKE 1 TABLET (325 MG TOTAL) BY MOUTH 2 (TWO) TIMES DAILY WITH A MEAL. 180 tablet 2  . lamoTRIgine (LAMICTAL) 100 MG tablet Take 1 tablet (100 mg total) by mouth 2 (two) times daily. 1809 tablet 3  . omeprazole (PRILOSEC) 20 MG capsule TAKE 1 CAPSULE BY MOUTH EVERY DAY 90 capsule 1  . rOPINIRole (REQUIP) 0.25 MG tablet Take 1 tablet (0.25 mg total) by mouth at bedtime. 30 tablet 6  . topiramate (TOPAMAX) 50 MG tablet Take 1.5 tablets (75 mg total) by mouth 2 (two) times daily. 90 tablet 3   No facility-administered medications prior to visit.    PAST MEDICAL HISTORY: Past Medical History:  Diagnosis Date  . GERD  (gastroesophageal reflux disease)   . History of CHF (congestive heart failure)    2006 during first preg. dx chf--  evaluated by cardologist (dr Melburn Popper)-- resolved after birth (no issue w/ other preg's)  . Iron deficiency anemia   . Menometrorrhagia   . Nocturnal seizures [facility]) neurologist-  dr sethi/ dr [facility])   last sz 06/14/18  . RLS (restless legs syndrome)   . Seasonal allergies   . Wears glasses     PAST SURGICAL HISTORY: Past Surgical History:  Procedure Laterality Date  . ABDOMINAL HYSTERECTOMY  11/21/2018  . DILITATION & CURRETTAGE/HYSTROSCOPY WITH NOVASURE ABLATION N/A 03/04/2017   Procedure: DILATATION & CURETTAGE/HYSTEROSCOPY WITH NOVASURE ABLATION;  Surgeon: Harold Hedge, MD;  Location: [facility];  Service: Gynecology;  Laterality: N/A;  . LAPAROSCOPIC CHOLECYSTECTOMY  04/2008  . LAPAROSCOPIC TUBAL LIGATION Bilateral 02/2016  . TRANSTHORACIC ECHOCARDIOGRAM  03/09/2014   ef 55-60%/  trivial MR and TR  . WISDOM TOOTH EXTRACTION  age 24    FAMILY HISTORY: Family History  Problem Relation Age of Onset  . Anesthesia problems Other   . Depression Father   . Arthritis Mother   . High Cholesterol Mother   . Asthma Son   . Healthy Son   . Hematuria Daughter     SOCIAL HISTORY: Social History   Social History  . Marital status: Separated    Spouse name: N/A  . Number of children: N/A  . Years of education: N/A   Occupational History  . Not on file.   Social History Main Topics  . Smoking status: Former Smoker    Years: 10.00    Types: Cigarettes    Quit date: 11/16/2006  . Smokeless tobacco: Never Used  . Alcohol use 0.6 oz/week    1 Glasses of wine per week     Comment: rare  . Drug use: No  . Sexual activity: Yes    Birth control/ protection: Surgical   Other Topics Concern  . 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    Neurologic Exam Mental Status: Awake and fully alert.  Normal speech and language.  Oriented to place and time. Recent and remote memory intact. Attention span, concentration and fund of knowledge appropriate. Mood and affect appropriate.  Cranial Nerves: Extraocular movements full without nystagmus. Hearing intact to voice. Facial sensation intact. Face, tongue, palate moves normally and symmetrically.  Shoulder shrug symmetric. Motor: No evidence of weakness per drift assessment Sensory.: intact to light touch Coordination: Rapid alternating movements normal in all extremities. Finger-to-nose and heel-to-shin performed accurately bilaterally. Gait and Station: Arises from chair without difficulty. Stance is normal. Gait demonstrates normal stride length and balance .  Reflexes: UTA     DIAGNOSTIC DATA (LABS, IMAGING, TESTING) - I reviewed patient records, labs, notes, testing and imaging myself where available.  Lab Results  Component Value Date   WBC 7.8 02/13/2019   HGB 11.0 (L) 02/13/2019   HCT 34.1 (L) 02/13/2019   MCV 72.9 (L) 02/13/2019   PLT 295.0 02/13/2019      Component Value Date/Time   Aguilar 140 02/01/2019 0950   K 4.4 02/01/2019 0950   CL 104 02/01/2019 0950   CO2 29 02/01/2019 0950   GLUCOSE 92 02/01/2019 0950   BUN 14 02/01/2019 0950   CREATININE 0.86 02/01/2019 0950   CALCIUM 9.5 02/01/2019 0950   PROT 6.7 02/01/2019 0950   ALBUMIN 3.8 03/03/2017 1159   AST 17 02/01/2019 0950   ALT 19 02/01/2019 0950   ALKPHOS 52 03/03/2017 1159   BILITOT 0.3 02/01/2019 0950   GFRNONAA 86 02/01/2019 0950   GFRAA 99  02/01/2019 0950   Lab Results  Component Value Date   CHOL 228 (H) 01/19/2019   HDL 50.50 01/19/2019   LDLCALC 161 (H) 01/19/2019   LDLDIRECT 160.6 01/04/2014   TRIG 82.0 01/19/2019  CHOLHDL 5 01/19/2019   Lab Results  Component Value Date   HGBA1C 5.9 01/19/2019    ASSESSMENT AND PLAN 39 y.o. year old female has a past medical history of 2 separate seizure events  while sleeping and one seizure event during dosage adjustment.  She is now more agitated and irritable on the Keppra.. She is morbidly obese.  MRI of the brain in the past has been normal. EEG has been normal most recent event occurred on 06/14/18. . 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 She was advised to lose weight and exercise.  Due to potential side effects, she was successfully titrated off of Keppra and titrated on Lamictal twice daily which she has been stable on without recurrent seizure activity.  Recurrent migraines discussed at prior visit and greatly benefited with use of Topamax.   Continue Topamax 75 mg twice daily -refill will be placed Continue  Lamictal 100 mg twice daily for seizure prophylaxis -refill placed Continue Requip 0.25 mg nightly for restless legs -refill placed Call for any seizure activity, worsening of migraines or prolonged symptoms  Follow-up in 6 months or call earlier if needed  Greater than 50% of this 20-minute nonface-to-face visit was spent discussing history of seizures with ongoing use of Lamictal and recurrent migraines with great benefit with use of Topamax, discussion regarding history of RLS which has been stable with ongoing use of Requip and answered all questions to patient satisfaction.  Ihor Austin, AGNP-BC  [facility] [address] [place], [place] [address]  Phone [phone] Fax [phone] Note: This document was prepared with digital  dictation and possible smart phrase technology. Any transcriptional errors that result from this process are unintentional.

## 2020-01-30 ENCOUNTER — Ambulatory Visit (INDEPENDENT_AMBULATORY_CARE_PROVIDER_SITE_OTHER): Payer: No Typology Code available for payment source | Admitting: Physician Assistant

## 2020-01-30 ENCOUNTER — Encounter: Payer: Self-pay | Admitting: Physician Assistant

## 2020-01-30 ENCOUNTER — Other Ambulatory Visit: Payer: Self-pay

## 2020-01-30 VITALS — BP 130/90 | HR 99 | Temp 100.0°F | Resp 16 | Ht 66.0 in | Wt 282.0 lb

## 2020-01-30 DIAGNOSIS — E785 Hyperlipidemia, unspecified: Secondary | ICD-10-CM | POA: Diagnosis not present

## 2020-01-30 DIAGNOSIS — Z0000 Encounter for general adult medical examination without abnormal findings: Secondary | ICD-10-CM | POA: Diagnosis not present

## 2020-01-30 DIAGNOSIS — Z6841 Body Mass Index (BMI) 40.0 and over, adult: Secondary | ICD-10-CM | POA: Diagnosis not present

## 2020-01-30 DIAGNOSIS — F419 Anxiety disorder, unspecified: Secondary | ICD-10-CM

## 2020-01-30 DIAGNOSIS — F329 Major depressive disorder, single episode, unspecified: Secondary | ICD-10-CM

## 2020-01-30 DIAGNOSIS — R569 Unspecified convulsions: Secondary | ICD-10-CM

## 2020-01-30 DIAGNOSIS — G2581 Restless legs syndrome: Secondary | ICD-10-CM

## 2020-01-30 LAB — LIPID PANEL
Cholesterol: 221 mg/dL — ABNORMAL HIGH (ref 0–200)
HDL: 54.3 mg/dL (ref >39.00)
LDL Cholesterol: 147 mg/dL — ABNORMAL HIGH (ref 0–99)
NonHDL: 167.17
Total CHOL/HDL Ratio: 4
Triglycerides: 101 mg/dL (ref 0.0–149.0)
VLDL: 20.2 mg/dL (ref 0.0–40.0)

## 2020-01-30 LAB — TSH: TSH: 1.43 u[IU]/mL (ref 0.35–4.50)

## 2020-01-30 LAB — CBC WITH DIFFERENTIAL/PLATELET
Basophils Absolute: 0 10*3/uL (ref 0.0–0.1)
Basophils Relative: 0.5 % (ref 0.0–3.0)
Eosinophils Absolute: 0.1 10*3/uL (ref 0.0–0.7)
Eosinophils Relative: 1.4 % (ref 0.0–5.0)
HCT: 40.2 % (ref 36.0–46.0)
Hemoglobin: 13.4 g/dL (ref 12.0–15.0)
Lymphocytes Relative: 33.4 % (ref 12.0–46.0)
Lymphs Abs: 1.6 10*3/uL (ref 0.7–4.0)
MCHC: 33.4 g/dL (ref 30.0–36.0)
MCV: 84.9 fl (ref 78.0–100.0)
Monocytes Absolute: 0.5 10*3/uL (ref 0.1–1.0)
Monocytes Relative: 10.5 % (ref 3.0–12.0)
Neutro Abs: 2.6 10*3/uL (ref 1.4–7.7)
Neutrophils Relative %: 54.2 % (ref 43.0–77.0)
Platelets: 210 10*3/uL (ref 150.0–400.0)
RBC: 4.73 Mil/uL (ref 3.87–5.11)
RDW: 14.7 % (ref 11.5–15.5)
WBC: 4.7 10*3/uL (ref 4.0–10.5)

## 2020-01-30 LAB — COMPREHENSIVE METABOLIC PANEL
ALT: 16 U/L (ref 0–35)
AST: 15 U/L (ref 0–37)
Albumin: 4.1 g/dL (ref 3.5–5.2)
Alkaline Phosphatase: 79 U/L (ref 39–117)
BUN: 20 mg/dL (ref 6–23)
CO2: 23 mEq/L (ref 19–32)
Calcium: 9 mg/dL (ref 8.4–10.5)
Chloride: 108 mEq/L (ref 96–112)
Creatinine, Ser: 1.01 mg/dL (ref 0.40–1.20)
GFR: 60.75 mL/min (ref >60.00)
Glucose, Bld: 84 mg/dL (ref 70–99)
Potassium: 4.4 mEq/L (ref 3.5–5.1)
Sodium: 139 mEq/L (ref 135–145)
Total Bilirubin: 0.3 mg/dL (ref 0.2–1.2)
Total Protein: 6.6 g/dL (ref 6.0–8.3)

## 2020-01-30 LAB — HEMOGLOBIN A1C: Hgb A1c MFr Bld: 5.2 % (ref 4.6–6.5)

## 2020-01-30 MED ORDER — DIAZEPAM 5 MG PO TABS: 5.0000 mg | ORAL_TABLET | Freq: Two times a day (BID) | ORAL | 0 refills | Status: AC | PRN

## 2020-01-30 NOTE — Patient Instructions (Signed)
Please go to the lab for blood work.   Our office will call you with your results unless you have chosen to receive results via MyChart.  If your blood work is normal we will follow-up each year for physicals and as scheduled for chronic medical problems.  If anything is abnormal we will treat accordingly and get you in for a follow-up.  I have increased the quantity of your valium for now to help with this situational stressors triggering anxiety. If things are ongoing we may need to increase the dose of your Cymbalta.   Elevate the wrist while resting. Wear your wrist brace nightly. Also wear throughout the day for the next 4-5 days. Continue your celebrex. Avoid heavy lifting. Let me know if things are not calming down.  Hang in there!   Preventive Care 16-65 Years Old, Female Preventive care refers to visits with your health care provider and lifestyle choices that can promote health and wellness. This includes:  A yearly physical exam. This may also be called an annual well check.  Regular dental visits and eye exams.  Immunizations.  Screening for certain conditions.  Healthy lifestyle choices, such as eating a healthy diet, getting regular exercise, not using drugs or products that contain nicotine and tobacco, and limiting alcohol use. What can I expect for my preventive care visit? Physical exam Your health care provider will check your:  Height and weight. This may be used to calculate body mass index (BMI), which tells if you are at a healthy weight.  Heart rate and blood pressure.  Skin for abnormal spots. Counseling Your health care provider may ask you questions about your:  Alcohol, tobacco, and drug use.  Emotional well-being.  Home and relationship well-being.  Sexual activity.  Eating habits.  Work and work Statistician.  Method of birth control.  Menstrual cycle.  Pregnancy history. What immunizations do I need?  Influenza (flu)  vaccine  This is recommended every year. Tetanus, diphtheria, and pertussis (Tdap) vaccine  You may need a Td booster every 10 years. Varicella (chickenpox) vaccine  You may need this if you have not been vaccinated. Human papillomavirus (HPV) vaccine  If recommended by your health care provider, you may need three doses over 6 months. Measles, mumps, and rubella (MMR) vaccine  You may need at least one dose of MMR. You may also need a second dose. Meningococcal conjugate (MenACWY) vaccine  One dose is recommended if you are age 53-21 years and a first-year college student living in a residence hall, or if you have one of several medical conditions. You may also need additional booster doses. Pneumococcal conjugate (PCV13) vaccine  You may need this if you have certain conditions and were not previously vaccinated. Pneumococcal polysaccharide (PPSV23) vaccine  You may need one or two doses if you smoke cigarettes or if you have certain conditions. Hepatitis A vaccine  You may need this if you have certain conditions or if you travel or work in places where you may be exposed to hepatitis A. Hepatitis B vaccine  You may need this if you have certain conditions or if you travel or work in places where you may be exposed to hepatitis B. Haemophilus influenzae type b (Hib) vaccine  You may need this if you have certain conditions. You may receive vaccines as individual doses or as more than one vaccine together in one shot (combination vaccines). Talk with your health care provider about the risks and benefits of combination vaccines. What  tests do I need?  Blood tests  Lipid and cholesterol levels. These may be checked every 5 years starting at age 73.  Hepatitis C test.  Hepatitis B test. Screening  Diabetes screening. This is done by checking your blood sugar (glucose) after you have not eaten for a while (fasting).  Sexually transmitted disease (STD)  testing.  BRCA-related cancer screening. This may be done if you have a family history of breast, ovarian, tubal, or peritoneal cancers.  Pelvic exam and Pap test. This may be done every 3 years starting at age 4. Starting at age 39, this may be done every 5 years if you have a Pap test in combination with an HPV test. Talk with your health care provider about your test results, treatment options, and if necessary, the need for more tests. Follow these instructions at home: Eating and drinking   Eat a diet that includes fresh fruits and vegetables, whole grains, lean protein, and low-fat dairy.  Take vitamin and mineral supplements as recommended by your health care provider.  Do not drink alcohol if: ? Your health care provider tells you not to drink. ? You are pregnant, may be pregnant, or are planning to become pregnant.  If you drink alcohol: ? Limit how much you have to 0-1 drink a day. ? Be aware of how much alcohol is in your drink. In the [place] one drink equals one 12 oz bottle of beer (355 mL), one 5 oz glass of wine (148 mL), or one 1 oz glass of hard liquor (44 mL). Lifestyle  Take daily care of your teeth and gums.  Stay active. Exercise for at least 30 minutes on 5 or more days each week.  Do not use any products that contain nicotine or tobacco, such as cigarettes, e-cigarettes, and chewing tobacco. If you need help quitting, ask your health care provider.  If you are sexually active, practice safe sex. Use a condom or other form of birth control (contraception) in order to prevent pregnancy and STIs (sexually transmitted infections). If you plan to become pregnant, see your health care provider for a preconception visit. What's next?  Visit your health care provider once a year for a well check visit.  Ask your health care provider how often you should have your eyes and teeth checked.  Stay up to date on all vaccines. This information is not intended to replace  advice given to you by your health care provider. Make sure you discuss any questions you have with your health care provider. Document Revised: 07/14/2018 Document Reviewed: 07/14/2018 Elsevier Patient Education  2020 [employer].

## 2020-01-30 NOTE — Progress Notes (Signed)
Patient presents to clinic today for annual exam.  Patient is fasting for labs.  Patient endorses some increase in anxiety recently -- situational. Notes she is in the process of seeking full custody of her children.  This is been a very protracted and arduous task.  Is causing episodes of acute anxiety.  Still not taking her diazepam daily but will have certain days where she may have to take twice in 1 day.  Denies depressed mood or anhedonia.   Health Maintenance: Immunizations -- Flu and TDaP up-to-date. PAP -- s/p hysterectomy.   Past Medical History:  Diagnosis Date  . GERD (gastroesophageal reflux disease)   . History of CHF (congestive heart failure)    2006 during first preg. dx chf--  evaluated by cardologist (dr Melburn Popper)-- resolved after birth (no issue w/ other preg's)  . Iron deficiency anemia   . Menometrorrhagia   . Nocturnal seizures [facility]) neurologist-  dr sethi/ dr [facility] neurology ass[facility])   last sz 06/14/18  . RLS (restless legs syndrome)   . Seasonal allergies   . Wears glasses     Past Surgical History:  Procedure Laterality Date  . ABDOMINAL HYSTERECTOMY  11/21/2018  . DILITATION & CURRETTAGE/HYSTROSCOPY WITH NOVASURE ABLATION N/A 03/04/2017   Procedure: DILATATION & CURETTAGE/HYSTEROSCOPY WITH NOVASURE ABLATION;  Surgeon: Harold Hedge, MD;  Location: [facility];  Service: Gynecology;  Laterality: N/A;  . LAPAROSCOPIC CHOLECYSTECTOMY  04/2008  . LAPAROSCOPIC TUBAL LIGATION Bilateral 02/2016  . TRANSTHORACIC ECHOCARDIOGRAM  03/09/2014   ef 55-60%/  trivial MR and TR  . WISDOM TOOTH EXTRACTION  age 36    Current Outpatient Medications on File Prior to Visit  Medication Sig Dispense Refill  . Acetaminophen (TYLENOL PO) Take by mouth as needed.    . celecoxib (CELEBREX) 200 MG capsule Take 1 capsule (200 mg total) by mouth 2 (two) times daily. 180 capsule 1  . Cholecalciferol (VITAMIN D-3) 25 MCG (1000 UT) CAPS Take 1 capsule (1,000 Units  total) by mouth daily. 30 capsule 0  . diazepam (VALIUM) 5 MG tablet Take 1 tablet (5 mg total) by mouth every 12 (twelve) hours as needed. 30 tablet 0  . DULoxetine (CYMBALTA) 20 MG capsule Take 2 capsules (40 mg total) by mouth daily. 180 capsule 1  . ferrous sulfate 325 (65 FE) MG tablet TAKE 1 TABLET (325 MG TOTAL) BY MOUTH 2 (TWO) TIMES DAILY WITH A MEAL. 180 tablet 2  . HUMIRA PEN 40 MG/0.4ML PNKT SMARTSIG:40 Milligram(s) SUB-Q Every 2 Weeks    . lamoTRIgine (LAMICTAL) 100 MG tablet Take 1 tablet (100 mg total) by mouth 2 (two) times daily. 180 tablet 3  . omeprazole (PRILOSEC) 20 MG capsule TAKE 1 CAPSULE BY MOUTH EVERY DAY 90 capsule 1  . rOPINIRole (REQUIP) 0.25 MG tablet Take 1 tablet (0.25 mg total) by mouth at bedtime. 30 tablet 6  . topiramate (TOPAMAX) 25 MG tablet Take 1 tablet (25 mg total) by mouth 2 (two) times daily. 60 tablet 5  . topiramate (TOPAMAX) 50 MG tablet Take 1 tablet (50 mg total) by mouth 2 (two) times daily. 60 tablet 5   No current facility-administered medications on file prior to visit.    Allergies  Allergen Reactions  . Amoxicillin Other (See Comments)    Yeast infection  . Ephedrine Other (See Comments)    Avoids due to possible cause of seizure    Family History  Problem Relation Age of Onset  . Anesthesia problems Other   .  Depression Father   . Arthritis Mother   . High Cholesterol Mother   . Asthma Son   . Healthy Son   . Hematuria Daughter     Social History   Socioeconomic History  . Marital status: Married    Spouse name: Not on file  . Number of children: Not on file  . Years of education: Not on file  . Highest education level: Not on file  Occupational History  . Not on file  Tobacco Use  . Smoking status: Former Smoker    Years: 10.00    Types: Cigarettes    Quit date: 11/16/2006    Years since quitting: 13.2  . Smokeless tobacco: Never Used  Substance and Sexual Activity  . Alcohol use: Yes    Alcohol/week: 1.0  standard drinks    Types: 1 Glasses of wine per week    Comment: rare  . Drug use: No  . Sexual activity: Yes    Birth control/protection: Surgical  Other Topics Concern  . 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 Difficulty of Paying Living Expenses:   Food Insecurity:   . Worried About Programme researcher, broadcasting/film/video in the Last Year:   . Barista in the Last Year:   Transportation Needs:   . Freight forwarder (Medical):   Marland Kitchen Lack of Transportation (Non-Medical):   Physical Activity:   . Days of Exercise per Week:   . Minutes of Exercise per Session:   Stress:   . Feeling of Stress :   Social Connections:   . Frequency of Communication with Friends and Family:   . Frequency of Social Gatherings with Friends and Family:   . Attends Religious Services:   . Active Member of Clubs or Organizations:   . Attends Banker Meetings:   Marland Kitchen Marital Status:   Intimate Partner Violence:   . Fear of Current or Ex-Partner:   . Emotionally Abused:   Marland Kitchen Physically Abused:   . Sexually Abused:    Review of Systems  Constitutional: Negative for fever and weight loss.  HENT: Negative for ear discharge, ear pain, hearing loss and tinnitus.   Eyes: Negative for blurred vision, double vision, photophobia and pain.  Respiratory: Negative for cough and shortness of breath.   Cardiovascular: Negative for chest pain and palpitations.  Gastrointestinal: Negative for abdominal pain, blood in stool, constipation, diarrhea, heartburn, melena, nausea and vomiting.  Genitourinary: Negative for dysuria, flank pain, frequency, hematuria and urgency.    Musculoskeletal: Negative for falls.  Neurological: Negative for dizziness, loss of consciousness and headaches.  Endo/Heme/Allergies: Negative for environmental allergies.  Psychiatric/Behavioral: Negative for depression, hallucinations, substance abuse and suicidal ideas. The patient is not nervous/anxious and does not have insomnia.    Wt 282 lb (127.9 kg)   LMP 02/12/2017 (Exact Date)   BMI 45.52 kg/m   Physical Exam Vitals reviewed.  HENT:     Head: Normocephalic and atraumatic.     Right Ear: Tympanic membrane, ear canal and external ear normal.  Left Ear: Tympanic membrane, ear canal and external ear normal.     Nose: Nose normal. No mucosal edema.     Mouth/Throat:     Pharynx: Uvula midline. No oropharyngeal exudate or posterior oropharyngeal erythema.  Eyes:     Conjunctiva/sclera: Conjunctivae normal.     Pupils: Pupils are equal, round, and reactive to light.  Neck:     Thyroid: No thyromegaly.  Cardiovascular:     Rate and Rhythm: Normal rate and regular rhythm.     Heart sounds: Normal heart sounds.  Pulmonary:     Effort: Pulmonary effort is normal. No respiratory distress.     Breath sounds: Normal breath sounds. No wheezing or rales.  Abdominal:     General: Bowel sounds are normal. There is no distension.     Palpations: Abdomen is soft. There is no mass.     Tenderness: There is no abdominal tenderness. There is no guarding or rebound.  Musculoskeletal:     Cervical back: Neck supple.  Lymphadenopathy:     Cervical: No cervical adenopathy.  Skin:    General: Skin is warm and dry.     Findings: No rash.  Neurological:     Mental Status: She is alert and oriented to person, place, and time.     Cranial Nerves: No cranial nerve deficit.    Assessment/Plan: 1. Visit for preventive health examination Depression screen negative. Health Maintenance reviewed. Preventive schedule discussed and handout given in AVS. Will obtain fasting labs today. -  CBC with Differential/Platelet - Comprehensive metabolic panel - Lipid panel - Hemoglobin A1c - TSH  2. Dyslipidemia We will recheck fasting lipids today. - Lipid panel  3. Class 3 severe obesity due to excess calories with body mass index (BMI) of 45.0 to 49.9 in adult, unspecified whether serious comorbidity present (HCC) Dietary and exercise recommendations reviewed.  Fasting labs today. - Comprehensive metabolic panel - Lipid panel - Hemoglobin A1c - TSH  4. Convulsions, unspecified convulsion type ([place]) 5. Restless legs syndrome Managed by neurology.  Taking medications as directed.  Doing very well at present.  Continue management per specialist.  6. Anxiety and depression Some deterioration in anxiety due to situational stressors that are above her control.  Continue stress relief tactics.  Will increase quantity of Valium short-term so that she will have if needed.  Discussed if this is ongoing we will need to consider increasing her Cymbalta or addition of another long-acting agent. - TSH  This visit occurred during the SARS-CoV-2 public health emergency.  Safety protocols were in place, including screening questions prior to the visit, additional usage of staff PPE, and extensive cleaning of exam room while observing appropriate contact time as indicated for disinfecting solutions.     Leeanne Rio, PA-C

## 2020-02-01 ENCOUNTER — Other Ambulatory Visit: Payer: Self-pay | Admitting: Emergency Medicine

## 2020-02-01 DIAGNOSIS — M797 Fibromyalgia: Secondary | ICD-10-CM

## 2020-02-01 DIAGNOSIS — M7918 Myalgia, other site: Secondary | ICD-10-CM

## 2020-02-01 MED ORDER — DULOXETINE HCL 20 MG PO CPEP: 40.0000 mg | ORAL_CAPSULE | Freq: Every day | ORAL | 1 refills | Status: DC

## 2020-03-06 ENCOUNTER — Telehealth: Payer: Self-pay | Admitting: Physician Assistant

## 2020-03-06 NOTE — Telephone Encounter (Signed)
Called and spoke with patient to get additional details. She states she went to the rheumatologist and they suggested for her to ask her PCP to be put on a fluid pill such as HCTZ for the swelling since starting the Humira. Patient states she has had swelling even before then. At times it gets to the point where she has to remove her rings. Appt scheduled.

## 2020-03-06 NOTE — Telephone Encounter (Signed)
Patient is calling requesting to speak with Selena Batten about possibly being put on a new medication, hydrochlorothiazide.

## 2020-03-06 NOTE — Telephone Encounter (Signed)
Patient scheduled for virtual visit 03/07/20 at 11am to discuss possibly starting HCTZ medication

## 2020-03-07 ENCOUNTER — Encounter: Payer: Self-pay | Admitting: Physician Assistant

## 2020-03-07 ENCOUNTER — Telehealth (INDEPENDENT_AMBULATORY_CARE_PROVIDER_SITE_OTHER): Payer: No Typology Code available for payment source | Admitting: Physician Assistant

## 2020-03-07 VITALS — BP 110/76 | HR 80 | Temp 97.2°F

## 2020-03-07 DIAGNOSIS — R609 Edema, unspecified: Secondary | ICD-10-CM | POA: Diagnosis not present

## 2020-03-07 DIAGNOSIS — R600 Localized edema: Secondary | ICD-10-CM

## 2020-03-07 MED ORDER — FUROSEMIDE 20 MG PO TABS: 20.0000 mg | ORAL_TABLET | Freq: Every day | ORAL | 0 refills | Status: DC

## 2020-03-07 NOTE — Patient Instructions (Signed)
Instructions sent to MyChart

## 2020-03-07 NOTE — Progress Notes (Signed)
Virtual Visit via Video   I connected with patient on 03/07/20 at 11:00 AM EDT by a video enabled telemedicine application and verified that I am speaking with the correct person using two identifiers.  Location patient: Home Location provider: [facility], Office Persons participating in the virtual visit: Patient, Provider, [place] (Patina Moore)  I discussed the limitations of evaluation and management by telemedicine and the availability of in person appointments. The patient expressed understanding and agreed to proceed.  Subjective:   HPI:   Patient presents via [place] today to discuss the potential medication for peripheral edema.  Patient endorses swelling and tightness of hands and legs bilaterally, some days worse than others.  Endorses a low salt intake as one of her children has kidney issues.  Notes keeping well hydrated and staying active.  Discussed symptoms with her rheumatologist at her follow-up yesterday as she was unsure if this was secondary to autoimmune condition or potentially medication.  Notes rheumatologist stated everything looks fine and did not feel was related.  Recommend she reach out to PCP to potentially start hydrochlorothiazide.  Patient denies any shortness of breath, chest pain, racing heart, PND orthopnea.  Denies any weeping of legs.  Occasionally swelling of lower extremities is pitting in nature.  Recent CMP within normal limits.  ROS:   See pertinent positives and negatives per HPI.  Patient Active Problem List   Diagnosis Date Noted  . Carpal tunnel syndrome of right wrist 05/16/2019  . Flat foot 02/15/2019  . Primary osteoarthritis of both knees 02/15/2019  . Myofascial pain 02/15/2019  . Vitamin D deficiency 02/01/2019  . Acute bacterial bronchitis 01/04/2019  . Obesity 03/04/2017  . Restless legs syndrome 06/05/2016  . Somnolence, daytime 02/17/2016  . Convulsions/seizures ([place]) 08/01/2015  . Prediabetes 02/15/2014  .  Dyslipidemia 02/15/2014  . Chronic headaches 01/04/2014  . Anxiety and depression 01/04/2014  . Mild intermittent asthma   . Anemia     Social History   Tobacco Use  . Smoking status: Former Smoker    Years: 10.00    Types: Cigarettes    Quit date: 11/16/2006    Years since quitting: 13.3  . Smokeless tobacco: Never Used  Substance Use Topics  . Alcohol use: Yes    Alcohol/week: 1.0 standard drinks    Types: 1 Glasses of wine per week    Comment: rare    Current Outpatient Medications:  .  Acetaminophen (TYLENOL PO), Take by mouth as needed., Disp: , Rfl:  .  celecoxib (CELEBREX) 200 MG capsule, Take 1 capsule (200 mg total) by mouth 2 (two) times daily., Disp: 180 capsule, Rfl: 1 .  Cholecalciferol (VITAMIN D-3) 25 MCG (1000 UT) CAPS, Take 1 capsule (1,000 Units total) by mouth daily., Disp: 30 capsule, Rfl: 0 .  diazepam (VALIUM) 5 MG tablet, Take 1 tablet (5 mg total) by mouth every 12 (twelve) hours as needed., Disp: 45 tablet, Rfl: 0 .  DULoxetine (CYMBALTA) 20 MG capsule, Take 2 capsules (40 mg total) by mouth daily., Disp: 180 capsule, Rfl: 1 .  ferrous sulfate 325 (65 FE) MG tablet, TAKE 1 TABLET (325 MG TOTAL) BY MOUTH 2 (TWO) TIMES DAILY WITH A MEAL., Disp: 180 tablet, Rfl: 2 .  HUMIRA PEN 40 MG/0.4ML PNKT, SMARTSIG:40 Milligram(s) SUB-Q Every 2 Weeks, Disp: , Rfl:  .  lamoTRIgine (LAMICTAL) 100 MG tablet, Take 1 tablet (100 mg total) by mouth 2 (two) times daily., Disp: 180 tablet, Rfl: 3 .  omeprazole (PRILOSEC) 20 MG  capsule, TAKE 1 CAPSULE BY MOUTH EVERY DAY, Disp: 90 capsule, Rfl: 1 .  rOPINIRole (REQUIP) 0.25 MG tablet, Take 1 tablet (0.25 mg total) by mouth at bedtime., Disp: 30 tablet, Rfl: 6 .  topiramate (TOPAMAX) 25 MG tablet, Take 1 tablet (25 mg total) by mouth 2 (two) times daily., Disp: 60 tablet, Rfl: 5 .  topiramate (TOPAMAX) 50 MG tablet, Take 1 tablet (50 mg total) by mouth 2 (two) times daily., Disp: 60 tablet, Rfl: 5  Allergies  Allergen Reactions  .  Amoxicillin Other (See Comments)    Yeast infection  . Ephedrine Other (See Comments)    Avoids due to possible cause of seizure    Objective:   BP 110/76   Pulse 80   Temp (!) 97.2 F (36.2 C) (Temporal)   LMP 02/12/2017 (Exact Date)   Patient is well-developed, well-nourished in no acute distress.  Resting comfortably at home.  Head is normocephalic, atraumatic.  No labored breathing.  Speech is clear and coherent with logical content.  Patient is alert and oriented at baseline.   Assessment and Plan:   1. Peripheral edema Does not seem cardiac in nature.  Recent electrolyte panel, kidney function, liver function and albumin all within normal limits.  Discussed supportive measures to help with symptoms.  Continue low-salt diet.  Discussed do not want to start HCTZ given her blood pressures typically on the low end of normal.  Will start trial Lasix 20 mg once daily for the next few days.  Then just as needed.  Follow-up in 2 weeks schedule for reassessment.  Patient has been encouraged to increase dietary intake of potassium.  We will recheck electrolytes at her follow-up in 2 weeks.  Patient voiced understanding and agreement with the plan.    Piedad Climes, [place] 03/07/2020

## 2020-03-07 NOTE — Progress Notes (Signed)
I have discussed the procedure for the virtual visit with the patient who has given consent to proceed with assessment and treatment.   Lindsay Aguilar, CMA     

## 2020-03-21 ENCOUNTER — Ambulatory Visit: Payer: No Typology Code available for payment source | Admitting: Physician Assistant

## 2020-03-25 ENCOUNTER — Ambulatory Visit: Payer: No Typology Code available for payment source | Admitting: Physician Assistant

## 2020-03-27 ENCOUNTER — Ambulatory Visit (INDEPENDENT_AMBULATORY_CARE_PROVIDER_SITE_OTHER): Payer: Self-pay | Admitting: Physician Assistant

## 2020-03-27 ENCOUNTER — Encounter: Payer: Self-pay | Admitting: Physician Assistant

## 2020-03-27 ENCOUNTER — Other Ambulatory Visit: Payer: Self-pay

## 2020-03-27 VITALS — BP 112/70 | HR 61 | Temp 99.0°F | Resp 16 | Wt 281.0 lb

## 2020-03-27 DIAGNOSIS — D509 Iron deficiency anemia, unspecified: Secondary | ICD-10-CM

## 2020-03-27 DIAGNOSIS — R609 Edema, unspecified: Secondary | ICD-10-CM

## 2020-03-27 LAB — BASIC METABOLIC PANEL
BUN: 15 mg/dL (ref 6–23)
CO2: 26 mEq/L (ref 19–32)
Calcium: 9.1 mg/dL (ref 8.4–10.5)
Chloride: 107 mEq/L (ref 96–112)
Creatinine, Ser: 0.89 mg/dL (ref 0.40–1.20)
GFR: 70.24 mL/min (ref >60.00)
Glucose, Bld: 94 mg/dL (ref 70–99)
Potassium: 4.7 mEq/L (ref 3.5–5.1)
Sodium: 141 mEq/L (ref 135–145)

## 2020-03-27 LAB — CBC
HCT: 39.1 % (ref 36.0–46.0)
Hemoglobin: 13.1 g/dL (ref 12.0–15.0)
MCHC: 33.5 g/dL (ref 30.0–36.0)
MCV: 86.1 fl (ref 78.0–100.0)
Platelets: 247 10*3/uL (ref 150.0–400.0)
RBC: 4.54 Mil/uL (ref 3.87–5.11)
RDW: 13.7 % (ref 11.5–15.5)
WBC: 6.4 10*3/uL (ref 4.0–10.5)

## 2020-03-27 NOTE — Patient Instructions (Addendum)
Please go to the lab today for blood work.  I will call you with your results. We will alter treatment regimen(s) if indicated by your results.   Continue iron supplement at current dose for now.  Based on potassium levels we may or may not add on a potassium supplement with increased dose of Lasix or switch the diuretic altogether. I should have these results by this afternoon so we should be able to get everything taken care of today.   Keep a low salt diet. Elevate legs while resting.

## 2020-03-27 NOTE — Progress Notes (Signed)
Patient presents to clinic today for follow-up of peripheral edema of bilateral lower extremities. At last visit patient was started on Furosemide 20 mg once daily. Endorses taking as directed with improvement but only some. Denies any new or worsening symptoms.  Again denies any PND or orthopnea.  Weight has been stable overall.  Continues to follow with rheumatology for her autoimmune issues.   Past Medical History:  Diagnosis Date  . GERD (gastroesophageal reflux disease)   . History of CHF (congestive heart failure)    2006 during first preg. dx chf--  evaluated by cardologist (dr Melburn Popper)-- resolved after birth (no issue w/ other preg's)  . Iron deficiency anemia   . Menometrorrhagia   . Nocturnal seizures [facility]) neurologist-  dr sethi/ dr Anne Hahn [facility])   last sz 06/14/18  . RLS (restless legs syndrome)   . Seasonal allergies   . Wears glasses     Current Outpatient Medications on File Prior to Visit  Medication Sig Dispense Refill  . Acetaminophen (TYLENOL PO) Take by mouth as needed.    Marland Kitchen b complex vitamins capsule Take 1 capsule by mouth daily.    . celecoxib (CELEBREX) 200 MG capsule Take 1 capsule (200 mg total) by mouth 2 (two) times daily. 180 capsule 1  . Cholecalciferol (VITAMIN D-3) 25 MCG (1000 UT) CAPS Take 1 capsule (1,000 Units total) by mouth daily. 30 capsule 0  . diazepam (VALIUM) 5 MG tablet Take 1 tablet (5 mg total) by mouth every 12 (twelve) hours as needed. 45 tablet 0  . DULoxetine (CYMBALTA) 20 MG capsule Take 2 capsules (40 mg total) by mouth daily. 180 capsule 1  . ferrous sulfate 325 (65 FE) MG tablet TAKE 1 TABLET (325 MG TOTAL) BY MOUTH 2 (TWO) TIMES DAILY WITH A MEAL. 180 tablet 2  . furosemide (LASIX) 20 MG tablet Take 1 tablet (20 mg total) by mouth daily. 30 tablet 0  . HUMIRA PEN 40 MG/0.4ML PNKT SMARTSIG:40 Milligram(s) SUB-Q Every 2 Weeks    . Krill Oil 350 MG CAPS Take 1 capsule by mouth daily.    Marland Kitchen lamoTRIgine (LAMICTAL) 100 MG  tablet Take 1 tablet (100 mg total) by mouth 2 (two) times daily. 180 tablet 3  . omeprazole (PRILOSEC) 20 MG capsule TAKE 1 CAPSULE BY MOUTH EVERY DAY 90 capsule 1  . rOPINIRole (REQUIP) 0.25 MG tablet Take 1 tablet (0.25 mg total) by mouth at bedtime. 30 tablet 6  . topiramate (TOPAMAX) 25 MG tablet Take 1 tablet (25 mg total) by mouth 2 (two) times daily. 60 tablet 5  . topiramate (TOPAMAX) 50 MG tablet Take 1 tablet (50 mg total) by mouth 2 (two) times daily. 60 tablet 5  . Zinc 30 MG CAPS Take 30 mg by mouth daily.     No current facility-administered medications on file prior to visit.    Allergies  Allergen Reactions  . Amoxicillin Other (See Comments)    Yeast infection  . Ephedrine Other (See Comments)    Avoids due to possible cause of seizure    Family History  Problem Relation Age of Onset  . Anesthesia problems Other   . Depression Father   . Arthritis Mother   . High Cholesterol Mother   . Asthma Son   . Healthy Son   . Hematuria Daughter   . Cancer Maternal Grandmother     Social History   Socioeconomic History  . Marital status: Married    Spouse name: Not on  file  . Number of children: Not on file  . Years of education: Not on file  . Highest education level: Not on file  Occupational History  . Not on file  Tobacco Use  . Smoking status: Former Smoker    Years: 10.00    Types: Cigarettes    Quit date: 11/16/2006    Years since quitting: 13.3  . Smokeless tobacco: Never Used  Substance and Sexual Activity  . Alcohol use: Yes    Alcohol/week: 1.0 standard drinks    Types: 1 Glasses of wine per week    Comment: rare  . Drug use: No  . Sexual activity: Yes    Birth control/protection: Surgical  Other Topics Concern  . 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 Difficulty of Paying Living Expenses:   Food Insecurity:   . Worried About Charity fundraiser in the Last Year:   . Arboriculturist in the Last Year:   Transportation Needs:   . Film/video editor (Medical):   Marland Kitchen Lack of Transportation (Non-Medical):   Physical Activity:   . Days of Exercise per Week:   . Minutes of Exercise per Session:   Stress:   . Feeling of Stress :   Social Connections:   . Frequency of Communication with Friends and Family:   . Frequency of Social Gatherings with Friends and Family:   . Attends Religious Services:   . Active Member of Clubs or Organizations:   . Attends Archivist Meetings:   Marland Kitchen Marital Status:    Review of Systems - See HPI.  All other ROS are negative.  BP 112/70   Pulse 61   Temp 99 F (37.2 C) (Temporal)   Resp 16   Wt 281 lb (127.5 kg)   LMP 02/12/2017 (Exact Date)   SpO2 99%   BMI 45.35 kg/m   Physical Exam Vitals reviewed.  Constitutional:      Appearance: Normal appearance.  HENT:     Head: Normocephalic and atraumatic.  Eyes:     Conjunctiva/sclera: Conjunctivae normal.  Cardiovascular:     Rate and Rhythm: Normal rate and regular rhythm.     Pulses: Normal pulses.          Dorsalis pedis pulses are 2+ on the right side and 2+ on the left side.       Posterior tibial pulses are 2+ on the right side and 2+ on the left side.     Heart sounds: Normal heart sounds.     Comments: Trace edema noted bilaterally of lower extremities up to level of lower shin.  No other extremity edema noted.  Pulmonary:     Effort: Pulmonary effort is normal.     Breath sounds: Normal breath sounds. No rales.  Musculoskeletal:     Cervical back: Neck supple.  Neurological:     General: No focal deficit present.     Mental Status: She is alert and oriented to person,  place, and time.  Psychiatric:  Mood and Affect: Mood normal.     Recent Results (from the past 2160 hour(s))  CBC with Differential/Platelet     Status: None   Collection Time: 01/30/20  9:27 AM  Result Value Ref Range   WBC 4.7 4.0 - 10.5 K/uL   RBC 4.73 3.87 - 5.11 Mil/uL   Hemoglobin 13.4 12.0 - 15.0 g/dL   HCT 51.7 61.6 - 07.3 %   MCV 84.9 78.0 - 100.0 fl   MCHC 33.4 30.0 - 36.0 g/dL   RDW 71.0 62.6 - 94.8 %   Platelets 210.0 150.0 - 400.0 K/uL   Neutrophils Relative % 54.2 43.0 - 77.0 %   Lymphocytes Relative 33.4 12.0 - 46.0 %   Monocytes Relative 10.5 3.0 - 12.0 %   Eosinophils Relative 1.4 0.0 - 5.0 %   Basophils Relative 0.5 0.0 - 3.0 %   Neutro Abs 2.6 1.4 - 7.7 K/uL   Lymphs Abs 1.6 0.7 - 4.0 K/uL   Monocytes Absolute 0.5 0.1 - 1.0 K/uL   Eosinophils Absolute 0.1 0.0 - 0.7 K/uL   Basophils Absolute 0.0 0.0 - 0.1 K/uL  Comprehensive metabolic panel     Status: None   Collection Time: 01/30/20  9:27 AM  Result Value Ref Range   Sodium 139 135 - 145 mEq/L   Potassium 4.4 3.5 - 5.1 mEq/L   Chloride 108 96 - 112 mEq/L   CO2 23 19 - 32 mEq/L   Glucose, Bld 84 70 - 99 mg/dL   BUN 20 6 - 23 mg/dL   Creatinine, Ser 5.46 0.40 - 1.20 mg/dL   Total Bilirubin 0.3 0.2 - 1.2 mg/dL   Alkaline Phosphatase 79 39 - 117 U/L   AST 15 0 - 37 U/L   ALT 16 0 - 35 U/L   Total Protein 6.6 6.0 - 8.3 g/dL   Albumin 4.1 3.5 - 5.2 g/dL   GFR 27.03 >50.09 mL/min   Calcium 9.0 8.4 - 10.5 mg/dL  Lipid panel     Status: Abnormal   Collection Time: 01/30/20  9:27 AM  Result Value Ref Range   Cholesterol 221 (H) 0 - 200 mg/dL    Comment: ATP III Classification       Desirable:  < 200 mg/dL               Borderline High:  200 - 239 mg/dL          High:  > = 381 mg/dL   Triglycerides 829.9 0.0 - 149.0 mg/dL    Comment: Normal:  <371 mg/dLBorderline High:  150 - 199 mg/dL   HDL 69.67 >89.38 mg/dL   VLDL 10.1 0.0 - 75.1 mg/dL   LDL Cholesterol 025 (H) 0 - 99 mg/dL   Total CHOL/HDL  Ratio 4     Comment:                Men          Women1/2 Average Risk     3.4          3.3Average Risk          5.0          4.42X Average Risk          9.6          7.13X Average Risk          15.0          11.0  NonHDL 167.17     Comment: NOTE:  Non-HDL goal should be 30 mg/dL higher than patient's LDL goal (i.e. LDL goal of < 70 mg/dL, would have non-HDL goal of < 100 mg/dL)  Hemoglobin J9E     Status: None   Collection Time: 01/30/20  9:27 AM  Result Value Ref Range   Hgb A1c MFr Bld 5.2 4.6 - 6.5 %    Comment: Glycemic Control Guidelines for People with Diabetes:Non Diabetic:  <6%Goal of Therapy: <7%Additional Action Suggested:  >8%   TSH     Status: None   Collection Time: 01/30/20  9:27 AM  Result Value Ref Range   TSH 1.43 0.35 - 4.50 uIU/mL  CBC     Status: None   Collection Time: 03/27/20  9:38 AM  Result Value Ref Range   WBC 6.4 4.0 - 10.5 K/uL   RBC 4.54 3.87 - 5.11 Mil/uL   Platelets 247.0 150.0 - 400.0 K/uL   Hemoglobin 13.1 12.0 - 15.0 g/dL   HCT 17.4 08.1 - 44.8 %   MCV 86.1 78.0 - 100.0 fl   MCHC 33.5 30.0 - 36.0 g/dL   RDW 18.5 63.1 - 49.7 %  Basic metabolic panel     Status: None   Collection Time: 03/27/20  9:38 AM  Result Value Ref Range   Sodium 141 135 - 145 mEq/L   Potassium 4.7 3.5 - 5.1 mEq/L   Chloride 107 96 - 112 mEq/L   CO2 26 19 - 32 mEq/L   Glucose, Bld 94 70 - 99 mg/dL   BUN 15 6 - 23 mg/dL   Creatinine, Ser 0.26 0.40 - 1.20 mg/dL   GFR 37.85 >88.50 mL/min   Calcium 9.1 8.4 - 10.5 mg/dL    Assessment/Plan: 1. Iron deficiency anemia, unspecified iron deficiency anemia type Patient due for repeat CBC to reassess anemia.  Is taking iron supplement as directed.  Repeat CBC today. - CBC  2. Peripheral edema Improved but still present.  Only trace amounts today on examination but patient notes this progressively worsens throughout the day and is improved in the morning after elevating her legs.  Discussed potential for  compression stockings.  She will give some thought to this.  Continue DASH diet.  Will check metabolic panel today.  If things are stable will increase Lasix to 40 mg once daily and add on potassium supplement.  Close follow-up discussed. - Basic metabolic panel  This visit occurred during the SARS-CoV-2 public health emergency.  Safety protocols were in place, including screening questions prior to the visit, additional usage of staff PPE, and extensive cleaning of exam room while observing appropriate contact time as indicated for disinfecting solutions.     Piedad Climes, PA-C

## 2020-03-28 ENCOUNTER — Other Ambulatory Visit: Payer: Self-pay

## 2020-03-28 MED ORDER — FUROSEMIDE 40 MG PO TABS: ORAL_TABLET | ORAL | 1 refills | Status: DC

## 2020-03-28 MED ORDER — POTASSIUM CHLORIDE CRYS ER 20 MEQ PO TBCR: 20.0000 meq | EXTENDED_RELEASE_TABLET | Freq: Every day | ORAL | 1 refills | Status: DC

## 2020-04-05 ENCOUNTER — Other Ambulatory Visit: Payer: Self-pay | Admitting: Emergency Medicine

## 2020-04-05 MED ORDER — FUROSEMIDE 40 MG PO TABS: ORAL_TABLET | ORAL | 1 refills | Status: DC

## 2020-04-05 MED ORDER — OMEPRAZOLE 20 MG PO CPDR: DELAYED_RELEASE_CAPSULE | ORAL | 1 refills | Status: DC

## 2020-04-18 ENCOUNTER — Other Ambulatory Visit: Payer: Self-pay | Admitting: Adult Health

## 2020-04-30 ENCOUNTER — Other Ambulatory Visit: Payer: Self-pay | Admitting: Physician Assistant

## 2020-05-27 ENCOUNTER — Other Ambulatory Visit: Payer: Self-pay | Admitting: Emergency Medicine

## 2020-05-27 DIAGNOSIS — R609 Edema, unspecified: Secondary | ICD-10-CM

## 2020-05-27 MED ORDER — FUROSEMIDE 40 MG PO TABS: 40.0000 mg | ORAL_TABLET | Freq: Every day | ORAL | 1 refills | Status: AC | PRN

## 2020-06-17 ENCOUNTER — Other Ambulatory Visit: Payer: Self-pay | Admitting: Adult Health

## 2020-06-21 ENCOUNTER — Other Ambulatory Visit: Payer: Self-pay | Admitting: Physician Assistant

## 2020-07-14 ENCOUNTER — Other Ambulatory Visit: Payer: Self-pay | Admitting: Adult Health

## 2020-07-15 NOTE — Telephone Encounter (Signed)
Refilled requip x 3 months with note to pharmacy: must schedule  FU for further refills.

## 2020-07-19 ENCOUNTER — Other Ambulatory Visit: Payer: Self-pay | Admitting: Obstetrics and Gynecology

## 2020-07-19 DIAGNOSIS — Z1231 Encounter for screening mammogram for malignant neoplasm of breast: Secondary | ICD-10-CM

## 2020-08-07 ENCOUNTER — Other Ambulatory Visit: Payer: Self-pay

## 2020-08-07 ENCOUNTER — Ambulatory Visit
Admission: RE | Admit: 2020-08-07 | Discharge: 2020-08-07 | Disposition: A | Discharge status: Home / Self Care | Payer: BC Managed Care – PPO | Source: Ambulatory Visit | Attending: Obstetrics and Gynecology | Admitting: Obstetrics and Gynecology

## 2020-08-07 DIAGNOSIS — Z1231 Encounter for screening mammogram for malignant neoplasm of breast: Secondary | ICD-10-CM

## 2020-08-28 ENCOUNTER — Encounter: Payer: Self-pay | Admitting: Physician Assistant

## 2020-08-28 DIAGNOSIS — M7918 Myalgia, other site: Secondary | ICD-10-CM

## 2020-08-28 DIAGNOSIS — M797 Fibromyalgia: Secondary | ICD-10-CM

## 2020-08-28 MED ORDER — DULOXETINE HCL 20 MG PO CPEP: 40.0000 mg | ORAL_CAPSULE | Freq: Every day | ORAL | 1 refills | Status: AC

## 2020-11-07 ENCOUNTER — Encounter: Payer: Self-pay | Admitting: Adult Health

## 2020-11-18 ENCOUNTER — Other Ambulatory Visit: Payer: Self-pay | Admitting: *Deleted

## 2020-11-18 MED ORDER — LAMOTRIGINE 100 MG PO TABS: 100.0000 mg | ORAL_TABLET | Freq: Two times a day (BID) | ORAL | 0 refills | Status: DC

## 2020-12-17 ENCOUNTER — Other Ambulatory Visit: Payer: Self-pay | Admitting: *Deleted

## 2020-12-17 MED ORDER — TOPIRAMATE 25 MG PO TABS: ORAL_TABLET | ORAL | 5 refills | Status: DC

## 2020-12-19 ENCOUNTER — Other Ambulatory Visit: Payer: Self-pay | Admitting: *Deleted

## 2020-12-19 MED ORDER — TOPIRAMATE 50 MG PO TABS: ORAL_TABLET | ORAL | 5 refills | Status: DC

## 2020-12-25 ENCOUNTER — Other Ambulatory Visit: Payer: Self-pay | Admitting: Physician Assistant

## 2021-01-21 ENCOUNTER — Other Ambulatory Visit: Payer: Self-pay | Admitting: *Deleted

## 2021-01-21 MED ORDER — LAMOTRIGINE 100 MG PO TABS: 100.0000 mg | ORAL_TABLET | Freq: Two times a day (BID) | ORAL | 0 refills | Status: DC

## 2021-01-29 ENCOUNTER — Other Ambulatory Visit: Payer: Self-pay

## 2021-01-29 ENCOUNTER — Encounter: Payer: Self-pay | Admitting: Adult Health

## 2021-01-29 ENCOUNTER — Ambulatory Visit: Payer: BC Managed Care – PPO | Admitting: Adult Health

## 2021-01-29 VITALS — BP 125/84 | HR 82 | Ht 66.0 in | Wt 298.0 lb

## 2021-01-29 DIAGNOSIS — Z79899 Other long term (current) drug therapy: Secondary | ICD-10-CM | POA: Diagnosis not present

## 2021-01-29 DIAGNOSIS — E611 Iron deficiency: Secondary | ICD-10-CM

## 2021-01-29 DIAGNOSIS — G40909 Epilepsy, unspecified, not intractable, without status epilepticus: Secondary | ICD-10-CM | POA: Diagnosis not present

## 2021-01-29 DIAGNOSIS — G2581 Restless legs syndrome: Secondary | ICD-10-CM | POA: Diagnosis not present

## 2021-01-29 DIAGNOSIS — G43709 Chronic migraine without aura, not intractable, without status migrainosus: Secondary | ICD-10-CM

## 2021-01-29 DIAGNOSIS — K219 Gastro-esophageal reflux disease without esophagitis: Secondary | ICD-10-CM

## 2021-01-29 MED ORDER — TOPIRAMATE 100 MG PO TABS: 100.0000 mg | ORAL_TABLET | Freq: Two times a day (BID) | ORAL | 3 refills | Status: DC

## 2021-01-29 MED ORDER — OMEPRAZOLE 20 MG PO CPDR: DELAYED_RELEASE_CAPSULE | ORAL | 3 refills | Status: AC

## 2021-01-29 MED ORDER — LAMOTRIGINE 100 MG PO TABS: 100.0000 mg | ORAL_TABLET | Freq: Two times a day (BID) | ORAL | 3 refills | Status: DC

## 2021-01-29 MED ORDER — ROPINIROLE HCL 0.25 MG PO TABS: 0.2500 mg | ORAL_TABLET | Freq: Every day | ORAL | 3 refills | Status: DC

## 2021-01-29 NOTE — Progress Notes (Signed)
[facility]  PATIENT: Lindsay Aguilar DOB: [birth year]   REASON FOR VISIT:  seizure disorder with recent seizure , restless leg syndrome, and recurrent migraines HISTORY FROM: Patient  Virtual Visit via Video Note  I connected with VEEDA VIRGO on 12/26/19 at  3:45 PM EST by a video enabled telemedicine application located at [facility] and verified that I am speaking with the correct person using two identifiers who was located at their own home.   I discussed the limitations of evaluation and management by telemedicine and the availability of in person appointments. The patient expressed understanding and agreed to proceed.    HISTORY OF PRESENT ILLNESS: HISTORY PSMs Lindsay Aguilar is a 40 year old pleasant Caucasian lady who 3 days ago on Monday night had an episode of witnessed seizure in sleep. She is unable to describe the episode and the husband was an eyewitness.. The patient and husband went to bed at the usual time. Her daughter who sleeps with them woke up and shouted.at about 2 AM which woke up the husband who noticed that his wife had tonic posturing of her upper extremities with tremulousness. Her mouth was closed. She had tongue bite. She had incontinence of urine. The patient stopped shaking after 3-5 minutes. She appeared to be postictal with transient disorientation and confusion. She recalls the EMS arriving to the house.and her disorientation started clearing by the time she reached the hospital. She had noncontrast CT scan of the head done on 08/16/2015 which I personally reviewed and is unremarkable except for evidence of incidental sinusitis. Basic metabolic panel labs and CBC were unremarkable. Patient had started a cold medication on that day which include atenolol, guaifenesin and phenylephrine. She has no known prior history of seizures, significant head injury with loss of consciousness, childhood meningitis, neurological illness.  She denies drinking or recreational abuse drugs or drinking significant amounts of alcohol. There is no family history of epilepsy either. She feels back to baseline and has no complaints today. She denies sleep deprivation, significant stress, irregular eating and sleeping habits. She does not drink excessive amounts of caffeine Update 10/22/2015 ; PSShe returns for follow-up after initial consultation visit 2 and half months ago. She continues to do well without recurrent seizure episodes. She had an MRI scan of the brain done on 08/19/15 which I personally reviewed and is normal without any structural abnormalities in the brain. EEG done on 08/22/15 is normal without definite epileptiform activity. Patient states she is not had any further seizure-like episodes or any other neurological complaints. She is keen to start driving again soon UPDATE 04/03/2017CM Lindsay Aguilar, 40 year old Aguilar returns for follow-up. She has a history of 2 seizure events since September both occurred while sleeping. Her most recent seizure on 02/12/16 occurred while she was napping in the afternoon with her daughter. She had some generalized shaking of her extremities that lasted approximately 3-4 minutes with loss of consciousness and incontinence of urine. She is markedly obese and may have sleep apnea. After her most recent event she needs anticonvulsant medications. MRI of the brain in the past has been normal. EEG has been normal UPDATE 07/21/2017CM. Lindsay Aguilar 40 year old Aguilar returns for follow-up. She was last seen in April and had 2 previous seizure events. She was placed on Keppra at her last visit and asked to get a sleep study . 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 She was advised  to lose weight and exercise. In addition there were frequent periodic limb movements of sleep with associated sleep disruption. Will check  ferritin level and iron studies. She has had a low hemoglobin in the past. She returns for reevaluation. She has not had further seizure events. She was made aware she still cannot drive for 3 months since her last seizure was 02/12/2016. UPDATE 10/04/2018CM Lindsay Aguilar, 40 year old Aguilar returns for follow-up with history of seizure disorder currently on Keppra with side effects of acid reflux. She has been placed on Prilosec. Last seizure event was in April 2017. She has a history of restless legs confirmed by sleep study .Her iron studies and ferritin have been normal. She remains overweight as been encouraged to lose weight and exercise. She is currently going through a divorce which she reportedly claims is very stressful for her particularly under the circumstances which it occurred. She caught  her husband watching her sister take a shower. She returns for reevaluation. She had an endometrial ablation in April 2018 She is wanting to be placed on Valium for her anxiety however she was advised to follow-up with her primary care for that . Valium is a controlled substance. UPDATE 7/31/2019CM Lindsay Aguilar, 40 year old Aguilar returns for follow-up with history of seizure disorder currently on Keppra and has had side effects of acid reflux in the past.  She reports in May she felt like she was going to have a seizure so she increased her dose on her own without calling us.  She increased it to 1-1/2 tabs in the morning and 1 tab at night of the 500 mg.  She had a seizure last night getting out of the car.  Her significant other helped her to the ground it just lasted moments no apparent injury except some scrapes on her feet and hands.  Significant other reports that since she increased the dose back in May she has been more irritable and agitated.  Prior to this last seizure was in April 2017.  She returns for reevaluation Update 09/20/2018 CM: Lindsay Aguilar, 40 year old Aguilar returns for follow-up with  history of seizure disorder.  Her last seizure was 06/14/2018.  She was on Keppra at the time and she had increased her dose on her own and became much more irritable and agitated.  She also had acid reflux on Keppra.  After her last visit she made a slow switch to Lamictal and titrated off of Keppra due to side effects she returns today feeling much better.  She also has history of restless legs and is on Requip.  She was advised to stop her Prilosec now for acid reflux from the [place].  She returns for reevaluation  Update 09/21/2019: Lindsay Aguilar is a 40 year old Aguilar who is being seen today for seizure disorder follow-up.  She has been doing well on Lamictal 100 mg twice daily without side effects.  She has not had any seizure activity.  She does endorse worsening migraines with prior history of migraines when she was younger but have been slowly worsening since her hysterectomy.  Over the past few months, she has been experiencing migraines approximately once weekly that are located right fronto-parietal region associated with photophobia, phonophobia and nausea. Typically last hrs to 1 day but recent migraine has been present for the past 3 days.  She has used Tylenol and ibuprofen which typically provides relief but has not improved current migraine.  She does endorse being under increased stress at home recently and questions whether  current headache is stress related.  She has not previously been on migraine preventatives or abortive medications.  No further concerns at this time.  Virtual visit 12/26/2019: Lindsay Aguilar is a 40 year old Aguilar who is being seen today via virtual visit for seizure migraine follow-up.  Migraines have greatly improved with ongoing use of Topamax 75 mg twice daily with only occasional residual headaches.  Continues on Lamictal 100 mg twice daily without seizure activity.  Continues on Requip 0.25 mg nightly for RLS with ongoing benefit.  She has been stable and has no concerns  at today's visit.   Update 01/29/2021 JM: Returns for yearly seizure and migraine follow-up  Compliant on Lamictal 100 mg twice daily tolerating without side effects and no recent seizure activity  Compliant on Requip 0.25 mg nightly for RLS tolerating with continued benefit  Compliant on topiramate 75 mg twice daily -does report recent increase frequency of headaches occurring approximately 1 time weekly.  Does endorse increased stressors and being switched from Humira to [facility] for OA but had difficulty obtaining Embrel and has been experiencing increasing pains -recently just started on Embrel.  She questions if dosage could be increased  She is in the process of switching PCPs as she was previously seen by Marcelline Mates, PA but he recently left and she has not been able to get refills on furosemide, omeprazole or Celebrex She also reports recently stopping her ferrous sulfate and requests iron levels be obtained  No further concerns at this time      REVIEW OF SYSTEMS: Full 14 system review of systems performed and notable only for those listed, all others are neg:  Constitutional: neg  Cardiovascular: neg Ear/Nose/Throat: neg  Skin: neg Eyes: neg Respiratory: neg Gastroitestinal: neg  Hematology/Lymphatic: neg Endocrine: neg Musculoskeletal: Diffuse joint pain Allergy/Immunology: neg Neurological:  seizure disorder last seizure 06/14/2018, migraines psychiatric: neg Sleep : Restless legs, fatigue  Allergies  Allergen Reactions  . Amoxicillin Other (See Comments)    Yeast infection  . Ephedrine Other (See Comments)    Avoids due to possible cause of seizure   Current Outpatient Medications on File Prior to Visit  Medication Sig Dispense Refill  . Acetaminophen (TYLENOL PO) Take by mouth as needed.    Marland Kitchen b complex vitamins capsule Take 1 capsule by mouth daily.    . Cholecalciferol (VITAMIN D-3) 25 MCG (1000 UT) CAPS Take 1 capsule (1,000 Units total) by mouth daily.  30 capsule 0  . diazepam (VALIUM) 5 MG tablet Take 1 tablet (5 mg total) by mouth every 12 (twelve) hours as needed. 45 tablet 0  . DULoxetine (CYMBALTA) 20 MG capsule Take 2 capsules (40 mg total) by mouth daily. 180 capsule 1  . ENBREL SURECLICK 50 MG/ML injection Inject into the skin once a week.    . ferrous sulfate 325 (Lindsay FE) MG tablet TAKE 1 TABLET (325 MG TOTAL) BY MOUTH 2 (TWO) TIMES DAILY WITH A MEAL. 180 tablet 2  . furosemide (LASIX) 40 MG tablet Take 1 tablet (40 mg total) by mouth daily as needed. 30 tablet 1  . Krill Oil 350 MG CAPS Take 1 capsule by mouth daily.    Marland Kitchen lamoTRIgine (LAMICTAL) 100 MG tablet Take 1 tablet (100 mg total) by mouth 2 (two) times daily. Appointment needed for further refills. 60 tablet 0  . potassium chloride SA (KLOR-CON) 20 MEQ tablet TAKE 1 TABLET(20 MEQ) BY MOUTH DAILY 90 tablet 0  . rOPINIRole (REQUIP) 0.25 MG tablet TAKE 1 TABLET  BY MOUTH AT BEDTIME 30 tablet 2  . Zinc 30 MG CAPS Take 30 mg by mouth daily.     No current facility-administered medications on file prior to visit.   Past Medical History:  Diagnosis Date  . GERD (gastroesophageal reflux disease)   . History of CHF (congestive heart failure)    2006 during first preg. dx chf--  evaluated by cardologist (dr Melburn Popper)-- resolved after birth (no issue w/ other preg's)  . Iron deficiency anemia   . Menometrorrhagia   . Nocturnal seizures [facility]) neurologist-  dr sethi/ dr Anne Hahn [facility])   last sz 06/14/18  . RLS (restless legs syndrome)   . Seasonal allergies   . Wears glasses      PHYSICAL EXAM Today's Vitals   03/16/Lindsay 1512  BP: 125/84  Pulse: 82  Weight: 298 lb (135.2 kg)  Height: 5\' 6"  (1.676 m)   Body mass index is 48.1 kg/m.  General: well developed, well nourished,  pleasant middle-age Caucasian Aguilar, seated, in no evident distress Head: head normocephalic and atraumatic.   Cardiovascular: regular rate and rhythm, no murmurs Vascular:  Normal pulses  all extremities   Neurologic Exam Mental Status: Awake and fully alert. Oriented to place and time. Recent and remote memory intact. Attention span, concentration and fund of knowledge appropriate. Mood and affect appropriate.  Cranial Nerves: Pupils equal, briskly reactive to light. Extraocular movements full without nystagmus. Visual fields full to confrontation. Hearing intact. Facial sensation intact. Face, tongue, palate moves normally and symmetrically.  Motor: Normal bulk and tone. Normal strength in all tested extremity muscles Sensory.: intact to touch , pinprick , position and vibratory sensation.  Coordination: Rapid alternating movements normal in all extremities. Finger-to-nose and heel-to-shin performed accurately bilaterally. Gait and Station: Arises from chair without difficulty. Stance is normal. Gait demonstrates normal stride length and balance without use of assistive device Reflexes: 1+ and symmetric. Toes downgoing.      DIAGNOSTIC DATA (LABS, IMAGING, TESTING) - I reviewed patient records, labs, notes, testing and imaging myself where available.   CBC    Component Value Date/Time   WBC 6.4 03/27/2020 0938   RBC 4.54 03/27/2020 0938   HGB 13.1 03/27/2020 0938   HGB 11.6 06/05/2016 1144   HCT 39.1 03/27/2020 0938   HCT 36.0 06/05/2016 1144   PLT 247.0 03/27/2020 0938   PLT 313 06/05/2016 1144   MCV 86.1 03/27/2020 0938   MCV 75 (L) 06/05/2016 1144   MCH 24.7 (L) 06/23/2018 1541   MCHC 33.5 03/27/2020 0938   RDW 13.7 03/27/2020 0938   RDW 15.1 06/05/2016 1144   LYMPHSABS 1.6 01/30/2020 0927   LYMPHSABS 2.6 06/05/2016 1144   MONOABS 0.5 01/30/2020 0927   EOSABS 0.1 01/30/2020 0927   EOSABS 0.1 06/05/2016 1144   BASOSABS 0.0 01/30/2020 0927   BASOSABS 0.0 06/05/2016 1144   CMP     Component Value Date/Time   Aguilar 141 03/27/2020 0938   K 4.7 03/27/2020 0938   CL 107 03/27/2020 0938   CO2 26 03/27/2020 0938   GLUCOSE 94 03/27/2020 0938   BUN 15  03/27/2020 0938   CREATININE 0.89 03/27/2020 0938   CREATININE 0.86 02/01/2019 0950   CALCIUM 9.1 03/27/2020 0938   PROT 6.6 01/30/2020 0927   ALBUMIN 4.1 01/30/2020 0927   AST 15 01/30/2020 0927   ALT 16 01/30/2020 0927   ALKPHOS 79 01/30/2020 0927   BILITOT 0.3 01/30/2020 0927   GFRNONAA 86 02/01/2019 0950   GFRAA  99 02/01/2019 0950       ASSESSMENT AND PLAN Lindsay Aguilar continues to be routinely followed in this office for seizures, chronic migraines and RLS.       2 separate seizure events  while sleeping and one seizure event during dosage adjustment.  She is now more agitated and irritable on the Keppra.. She is morbidly obese.  MRI of the brain in the past has been normal. EEG has been normal most recent event occurred on 06/14/18. . 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 She was advised to lose weight and exercise.  Due to potential side effects, she was successfully titrated off of Keppra and titrated on Lamictal twice daily which she has been stable on without recurrent seizure activity.  Recurrent migraines discussed at prior visit and greatly benefited with use of Topamax.   Seizure disorder -Well-controlled on lamotrigine 100 mg twice daily -refill provided -Obtain lamotrigine level, CMP and CBC for routine therapeutic drug level monitoring -Discussed avoidance of seizure provoking events and to call office with any seizure episodes or concerns  Migraine, chronic -Currently experiencing migraines once weekly -Increase topiramate from 75 mg BID to 100mg  BID -Discussed possible worsening in setting of increased stressors and recent changes of medications -Advised to call office with any difficulty tolerating or if she wishes to decrease dose back if headaches stabilize  Restless leg syndrome -Well-controlled on Requip 0.25 mg nightly -refill placed  Iron  deficiency -Typically managed by PCP but currently in process of transitioning -Repeat iron level today and based on results, restart ferrous sulfate  GERD -Apparently medication initially started by our office -will refill omeprazole today but request PCP continue to fill once she transitions to a new PCP    Follow-up in 1 year or call earlier if needed  CC:  [facility] provider: Dr.   I spent 35 minutes of face-to-face and non-face-to-face time with patient.  This included previsit chart review, lab review, study review, order entry, electronic health record documentation, patient education and discussion regarding history of seizures and ongoing use of AED, chronic migraines with recent worsening and use of medications, RLS, iron deficiency, GERD and answered all other questions to patient satisfaction   Pearlean Brownie, AGNP-BC  [facility] [address] [place], [place]  Phone [phone] Fax [phone] Note: This document was prepared with digital dictation and possible smart phrase technology. Any transcriptional errors that result from this process are unintentional.

## 2021-01-29 NOTE — Patient Instructions (Addendum)
Your Plan:  Increase topamax to 100mg  twice daily for continued headaches   Continue lamictal 100mg  twice daily for seizure prevention  Continue requip 0.25mg  nightly for restless leg  We will check lab work today   Follow up in 1 year or call earlier if needed      Thank you for coming to see at [facility]. I hope we have been able to provide you high quality care today.  You may receive a patient satisfaction survey over the next few weeks. We would appreciate your feedback and comments so that we may continue to improve ourselves and the health of our patients.

## 2021-01-30 LAB — COMPREHENSIVE METABOLIC PANEL
ALT: 22 IU/L (ref 0–32)
AST: 12 IU/L (ref 0–40)
Albumin/Globulin Ratio: 1.7 (ref 1.2–2.2)
Albumin: 4.3 g/dL (ref 3.8–4.8)
Alkaline Phosphatase: 90 IU/L (ref 44–121)
BUN/Creatinine Ratio: 14 (ref 9–23)
BUN: 13 mg/dL (ref 6–24)
Bilirubin Total: <0.2 mg/dL (ref 0.0–1.2)
CO2: 21 mmol/L (ref 20–29)
Calcium: 9.3 mg/dL (ref 8.7–10.2)
Chloride: 105 mmol/L (ref 96–106)
Creatinine, Ser: 0.96 mg/dL (ref 0.57–1.00)
Globulin, Total: 2.6 g/dL (ref 1.5–4.5)
Glucose: 114 mg/dL — ABNORMAL HIGH (ref 65–99)
Potassium: 4 mmol/L (ref 3.5–5.2)
Sodium: 141 mmol/L (ref 134–144)
Total Protein: 6.9 g/dL (ref 6.0–8.5)
eGFR: 77 mL/min/{1.73_m2} (ref >59)

## 2021-01-30 LAB — CBC
Hematocrit: 41.6 % (ref 34.0–46.6)
Hemoglobin: 13.8 g/dL (ref 11.1–15.9)
MCH: 28.6 pg (ref 26.6–33.0)
MCHC: 33.2 g/dL (ref 31.5–35.7)
MCV: 86 fL (ref 79–97)
Platelets: 272 10*3/uL (ref 150–450)
RBC: 4.83 x10E6/uL (ref 3.77–5.28)
RDW: 12.2 % (ref 11.7–15.4)
WBC: 9.8 10*3/uL (ref 3.4–10.8)

## 2021-01-30 LAB — IRON: Iron: 40 ug/dL (ref 27–159)

## 2021-01-30 LAB — LAMOTRIGINE LEVEL: Lamotrigine Lvl: 5.9 ug/mL (ref 2.0–20.0)

## 2021-01-31 ENCOUNTER — Encounter: Payer: No Typology Code available for payment source | Admitting: Physician Assistant

## 2021-02-02 NOTE — Progress Notes (Signed)
I agree with the above plan 

## 2021-03-25 ENCOUNTER — Other Ambulatory Visit: Payer: Self-pay | Admitting: Family Medicine

## 2022-01-22 ENCOUNTER — Telehealth: Payer: Self-pay | Admitting: Adult Health

## 2022-01-22 NOTE — Telephone Encounter (Signed)
Lindsay Aguilar is leaving early on March 16. I called the patient to reschedule and left a voice mail asking her to call the office back. Can use the held botox slot on 3/13 or 3/14 or there are office visits open on April 4. ?

## 2022-01-29 ENCOUNTER — Ambulatory Visit: Payer: No Typology Code available for payment source | Admitting: Adult Health

## 2022-02-02 ENCOUNTER — Other Ambulatory Visit: Payer: Self-pay | Admitting: *Deleted

## 2022-02-02 DIAGNOSIS — G40909 Epilepsy, unspecified, not intractable, without status epilepticus: Secondary | ICD-10-CM

## 2022-02-16 ENCOUNTER — Other Ambulatory Visit: Payer: Self-pay

## 2022-02-16 MED ORDER — ROPINIROLE HCL 0.25 MG PO TABS: 0.2500 mg | ORAL_TABLET | Freq: Every day | ORAL | 0 refills | Status: DC

## 2022-02-17 ENCOUNTER — Telehealth: Payer: Self-pay | Admitting: Adult Health

## 2022-02-17 DIAGNOSIS — G40909 Epilepsy, unspecified, not intractable, without status epilepticus: Secondary | ICD-10-CM

## 2022-02-17 MED ORDER — TOPIRAMATE 100 MG PO TABS: 100.0000 mg | ORAL_TABLET | Freq: Two times a day (BID) | ORAL | 3 refills | Status: DC

## 2022-02-17 NOTE — Telephone Encounter (Signed)
Pt request refill for topiramate (TOPAMAX) 100 MG tablet at  ?[facility]  ?[address] [place] [address] [place];[phone] ?

## 2022-02-17 NOTE — Telephone Encounter (Signed)
Refill sent as requested, patient has FU in June. ?

## 2022-02-18 ENCOUNTER — Other Ambulatory Visit: Payer: Self-pay | Admitting: Obstetrics and Gynecology

## 2022-02-18 DIAGNOSIS — Z1231 Encounter for screening mammogram for malignant neoplasm of breast: Secondary | ICD-10-CM

## 2022-02-19 ENCOUNTER — Ambulatory Visit
Admission: RE | Admit: 2022-02-19 | Discharge: 2022-02-19 | Disposition: A | Discharge status: Home / Self Care | Payer: BC Managed Care – PPO | Source: Ambulatory Visit

## 2022-02-19 DIAGNOSIS — Z1231 Encounter for screening mammogram for malignant neoplasm of breast: Secondary | ICD-10-CM

## 2022-03-23 ENCOUNTER — Other Ambulatory Visit: Payer: Self-pay

## 2022-03-23 MED ORDER — LAMOTRIGINE 100 MG PO TABS: 100.0000 mg | ORAL_TABLET | Freq: Two times a day (BID) | ORAL | 0 refills | Status: DC

## 2022-04-27 ENCOUNTER — Encounter: Payer: Self-pay | Admitting: Adult Health

## 2022-04-27 ENCOUNTER — Ambulatory Visit: Payer: BC Managed Care – PPO | Admitting: Adult Health

## 2022-04-27 VITALS — BP 134/76 | Ht 66.0 in | Wt 306.0 lb

## 2022-04-27 DIAGNOSIS — G2581 Restless legs syndrome: Secondary | ICD-10-CM | POA: Diagnosis not present

## 2022-04-27 DIAGNOSIS — Z79899 Other long term (current) drug therapy: Secondary | ICD-10-CM | POA: Diagnosis not present

## 2022-04-27 DIAGNOSIS — G40909 Epilepsy, unspecified, not intractable, without status epilepticus: Secondary | ICD-10-CM

## 2022-04-27 DIAGNOSIS — G43709 Chronic migraine without aura, not intractable, without status migrainosus: Secondary | ICD-10-CM | POA: Diagnosis not present

## 2022-04-27 MED ORDER — ROPINIROLE HCL 0.25 MG PO TABS: 0.2500 mg | ORAL_TABLET | Freq: Every day | ORAL | 3 refills | Status: DC

## 2022-04-27 MED ORDER — LAMOTRIGINE 100 MG PO TABS: 100.0000 mg | ORAL_TABLET | Freq: Two times a day (BID) | ORAL | 3 refills | Status: DC

## 2022-04-27 NOTE — Patient Instructions (Signed)
Your Plan:  Continue current regimen at this time     Follow up in 1 year or call earlier if needed     Thank you for coming to see [place] at [facility]. I hope we have been able to provide you high quality care today.  You may receive a patient satisfaction survey over the next few weeks. We would appreciate your feedback and comments so that we may continue to improve ourselves and the health of our patients.

## 2022-04-27 NOTE — Progress Notes (Signed)
[facility]  PATIENT: Lindsay Aguilar DOB: 01/30/1981   REASON FOR VISIT:  seizure disorder, restless leg syndrome, and recurrent migraines HISTORY FROM: Patient   Chief complaint: Migraine seizure follow-up  HISTORY OF PRESENT ILLNESS:  Lindsay FreestoneLaura Marie Aguilar is a 41 y.o. female who continues to be followed in this office for history of seizure disorder, chronic migraines and RLS.    HISTORY PSMs Lindsay Aguilar is a 41 year old pleasant Caucasian lady who 3 days ago on Monday night had an episode of witnessed seizure in sleep. She is unable to describe the episode and the husband was an eyewitness.. The patient and husband went to bed at the usual time. Her daughter who sleeps with them woke up and shouted.at about 2 AM which woke up the husband  who noticed that his wife had tonic posturing of her upper extremities with tremulousness. Her mouth was closed. She had tongue bite. She had incontinence of urine. The patient  stopped shaking after 3-5 minutes. She appeared to be postictal with  transient disorientation and confusion. She recalls the EMS arriving to the house.and her disorientation started clearing by the time she reached the hospital. She had noncontrast CT scan of the head done on 08/16/2015 which I personally reviewed and is unremarkable except for evidence of incidental sinusitis. Basic metabolic panel labs and CBC were unremarkable. Patient had started a cold medication on that day which include atenolol, guaifenesin and phenylephrine. She has no known prior history of seizures, significant head injury with loss of consciousness, childhood meningitis, neurological illness. She denies drinking or recreational abuse drugs or drinking significant amounts of alcohol. There is no family history of epilepsy either. She feels back to baseline and has no complaints today. She denies sleep deprivation, significant stress, irregular eating and sleeping habits. She does not drink  excessive amounts of caffeine  Update 10/22/2015 ; PS She returns for follow-up after initial consultation visit 2 and half months ago. She continues to do well without recurrent seizure episodes. She had an MRI scan of the brain done on 08/19/15 which I personally reviewed and is normal without any structural abnormalities in the brain. EEG done on 08/22/15 is normal without definite epileptiform activity. Patient states she is not had any further seizure-like episodes or any other neurological complaints. She is keen to start driving again soon UPDATE 04/03/2017CM Ms  Lindsay Aguilar, 41 year old female returns for follow-up. She has a history of 2 seizure events since September both occurred while sleeping. Her most recent seizure on 02/12/16 occurred while she was napping in the afternoon with her daughter. She had some generalized shaking of her extremities that lasted approximately 3-4 minutes with loss of consciousness and incontinence of urine. She is markedly obese and may have sleep apnea. After her most recent event she needs anticonvulsant medications. MRI of the brain in the past has been normal. EEG has been normal UPDATE 07/21/2017CM. [name] [id number] year old female returns for follow-up. She was last seen in April and had 2 previous seizure events. She was placed on Keppra at her last visit and asked to get a sleep study . 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 She was advised to lose weight and exercise. In addition there were frequent periodic limb movements of sleep with associated sleep disruption. Will check ferritin level and iron studies. She has had a low hemoglobin in the past. She returns for reevaluation. She has not  had further seizure events. She was made aware she still cannot drive for 3 months since her last seizure was 02/12/2016. UPDATE 10/04/2018CM [name], 41 year old female returns for  follow-up with history of seizure disorder currently on Keppra with side effects of acid reflux. She has been placed on Prilosec. Last seizure event was in April 2017. She has a history of restless legs confirmed by sleep study .Her iron studies and ferritin have been normal. She remains overweight as been encouraged to lose weight and exercise. She is currently going through a divorce which she reportedly claims is very stressful for her particularly under the circumstances which it occurred. She caught  her husband watching her sister take a shower. She returns for reevaluation. She had an endometrial ablation in April 2018 She is wanting to be placed on Valium for her anxiety however she was advised to follow-up with her primary care for that . Valium is a controlled substance. UPDATE 7/31/2019CM [name], 41 year old female returns for follow-up with history of seizure disorder currently on Keppra and has had side effects of acid reflux in the past.  She reports in May she felt like she was going to have a seizure so she increased her dose on her own without calling us.  She increased it to 1-1/2 tabs in the morning and 1 tab at night of the 500 mg.  She had a seizure last night getting out of the car.  Her significant other helped her to the ground it just lasted moments no apparent injury except some scrapes on her feet and hands.  Significant other reports that since she increased the dose back in May she has been more irritable and agitated.  Prior to this last seizure was in April 2017.  She returns for reevaluation Update 09/20/2018 CM: [name], 41 year old female returns for follow-up with history of seizure disorder.  Her last seizure was 06/14/2018.  She was on Keppra at the time and she had increased her dose on her own and became much more irritable and agitated.  She also had acid reflux on Keppra.  After her last visit she made a slow switch to Lamictal and titrated off of Keppra due to  side effects she returns today feeling much better.  She also has history of restless legs and is on Requip.  She was advised to stop her Prilosec now for acid reflux from the Keppra.  She returns for reevaluation  Update 09/21/2019: Ms. Crosby is a 41 year old female who is being seen today for seizure disorder follow-up.  She has been doing well on Lamictal 100 mg twice daily without side effects.  She has not had any seizure activity.  She does endorse worsening migraines with prior history of migraines when she was younger but have been slowly worsening since her hysterectomy.  Over the past few months, she has been experiencing migraines approximately once weekly that are located right fronto-parietal region associated with photophobia, phonophobia and nausea. Typically last hrs to 1 day but recent migraine has been present for the past 3 days.  She has used Tylenol and ibuprofen which typically provides relief but has not improved current migraine.  She does endorse being under increased stress at home recently and questions whether current headache is stress related.  She has not previously been on migraine preventatives or abortive medications.  No further concerns at this time.  Virtual visit 12/26/2019: Ms. Popoca is a 41 year old female who is being seen today via virtual visit  for seizure migraine follow-up.  Migraines have greatly improved with ongoing use of Topamax 75 mg twice daily with only occasional residual headaches.  Continues on Lamictal 100 mg twice daily without seizure activity.  Continues on Requip 0.25 mg nightly for RLS with ongoing benefit.  She has been stable and has no concerns at today's visit.   Update 01/29/2021 JM: Returns for yearly seizure and migraine follow-up  Compliant on Lamictal 100 mg twice daily tolerating without side effects and no recent seizure activity  Compliant on Requip 0.25 mg nightly for RLS tolerating with continued benefit  Compliant on topiramate  75 mg twice daily -does report recent increase frequency of headaches occurring approximately 1 time weekly.  Does endorse increased stressors and being switched from Humira to [facility] for OA but had difficulty obtaining Embrel and has been experiencing increasing pains -recently just started on Embrel.  She questions if dosage could be increased  She is in the process of switching PCPs as she was previously seen by Marcelline Mates, PA but he recently left and she has not been able to get refills on furosemide, omeprazole or Celebrex She also reports recently stopping her ferrous sulfate and requests iron levels be obtained  No further concerns at this time   Update 04/27/2022 JM: Patient returns for yearly follow-up unaccompanied  Seizure disorder:  Compliant on Lamictal 100 mg twice daily, denies side effects, denies any recent seizure activity  Migraines:  Compliant on topiramate 100 mg twice daily.  Increased dose at prior visit which she is tolerating well without side effects.  Initially after increasing dose, she found significant improvement of her migraines with only rare occurrences.  Unfortunately, she has been dealing with significant stress since December and has been having more frequent migraines, she has not able to say how many migraines per month.    RLS: Stable on Requip 0.25 mg nightly, denies side effects      REVIEW OF SYSTEMS: Full 14 system review of systems performed and notable only for those listed, all others are neg:  Constitutional: neg  Cardiovascular: neg Ear/Nose/Throat: neg  Skin: neg Eyes: neg Respiratory: neg Gastroitestinal: neg  Hematology/Lymphatic: neg Endocrine: neg Musculoskeletal: Diffuse joint pain Allergy/Immunology: neg Neurological:  seizure disorder last seizure 06/14/2018, migraines psychiatric: neg Sleep : Restless legs, fatigue  Allergies  Allergen Reactions   Amoxicillin Other (See Comments)    Yeast infection   Ephedrine  Other (See Comments)    Avoids due to possible cause of seizure   Current Outpatient Medications on File Prior to Visit  Medication Sig Dispense Refill   Acetaminophen (TYLENOL PO) Take by mouth as needed.     b complex vitamins capsule Take 1 capsule by mouth daily.     Cholecalciferol (VITAMIN D-3) 25 MCG (1000 UT) CAPS Take 1 capsule (1,000 Units total) by mouth daily. 30 capsule 0   diazepam (VALIUM) 5 MG tablet Take 1 tablet (5 mg total) by mouth every 12 (twelve) hours as needed. 45 tablet 0   DULoxetine (CYMBALTA) 20 MG capsule Take 2 capsules (40 mg total) by mouth daily. 180 capsule 1   ENBREL SURECLICK 50 MG/ML injection Inject into the skin once a week.     ferrous sulfate 325 (65 FE) MG tablet TAKE 1 TABLET (325 MG TOTAL) BY MOUTH 2 (TWO) TIMES DAILY WITH A MEAL. 180 tablet 2   furosemide (LASIX) 40 MG tablet Take 1 tablet (40 mg total) by mouth daily as needed. 30 tablet 1  Krill Oil 350 MG CAPS Take 1 capsule by mouth daily.     omeprazole (PRILOSEC) 20 MG capsule TAKE 1 CAPSULE BY MOUTH EVERY DAY 90 capsule 3   potassium chloride SA (KLOR-CON) 20 MEQ tablet TAKE 1 TABLET(20 MEQ) BY MOUTH DAILY 90 tablet 0   topiramate (TOPAMAX) 100 MG tablet Take 1 tablet (100 mg total) by mouth 2 (two) times daily. 180 tablet 3   Zinc 30 MG CAPS Take 30 mg by mouth daily.     No current facility-administered medications on file prior to visit.   Past Medical History:  Diagnosis Date   GERD (gastroesophageal reflux disease)    History of CHF (congestive heart failure)    2006 during first preg. dx chf--  evaluated by cardologist (dr Melburn Popper)-- resolved after birth (no issue w/ other preg's)   Iron deficiency anemia    Menometrorrhagia    Nocturnal seizures [facility]) neurologist-  dr sethi/ dr Anne Hahn [facility])   last sz 06/14/18   RLS (restless legs syndrome)    Seasonal allergies    Wears glasses      PHYSICAL EXAM Today's Vitals   04/27/22 1048  BP: 134/76  Weight: (!)  306 lb (138.8 kg)  Height:  (1.676 m)   Body mass index is 49.39 kg/m.  General: well developed, well nourished,  pleasant middle-age Caucasian female, seated, tearful during visit Head: head normocephalic and atraumatic.   Cardiovascular: regular rate and rhythm, no murmurs Vascular:  Normal pulses all extremities   Neurologic Exam Mental Status: Awake and fully alert. Oriented to place and time. Recent and remote memory intact. Attention span, concentration and fund of knowledge appropriate. Mood and affect flat and tearful.  Cranial Nerves: Pupils equal, briskly reactive to light. Extraocular movements full without nystagmus. Visual fields full to confrontation. Hearing intact. Facial sensation intact. Face, tongue, palate moves normally and symmetrically.  Motor: Normal bulk and tone. Normal strength in all tested extremity muscles Sensory.: intact to touch , pinprick , position and vibratory sensation.  Coordination: Rapid alternating movements normal in all extremities. Finger-to-nose and heel-to-shin performed accurately bilaterally. Gait and Station: Arises from chair without difficulty. Stance is normal. Gait demonstrates normal stride length and balance without use of assistive device Reflexes: 1+ and symmetric. Toes downgoing.      DIAGNOSTIC DATA (LABS, IMAGING, TESTING) - I reviewed patient records, labs, notes, testing and imaging myself where available.   CBC    Component Value Date/Time   WBC 9.8 01/29/2021 1556   WBC 6.4 03/27/2020 0938   RBC 4.83 01/29/2021 1556   RBC 4.54 03/27/2020 0938   HGB 13.8 01/29/2021 1556   HCT 41.6 01/29/2021 1556   PLT 272 01/29/2021 1556   MCV 86 01/29/2021 1556   MCH 28.6 01/29/2021 1556   MCH 24.7 (L) 06/23/2018 1541   MCHC 33.2 01/29/2021 1556   MCHC 33.5 03/27/2020 0938   RDW 12.2 01/29/2021 1556   LYMPHSABS 1.6 01/30/2020 0927   LYMPHSABS 2.6 06/05/2016 1144   MONOABS 0.5 01/30/2020 0927   EOSABS 0.1 01/30/2020  0927   EOSABS 0.1 06/05/2016 1144   BASOSABS 0.0 01/30/2020 0927   BASOSABS 0.0 06/05/2016 1144   CMP     Component Value Date/Time   Aguilar 141 01/29/2021 1556   K 4.0 01/29/2021 1556   CL 105 01/29/2021 1556   CO2 21 01/29/2021 1556   GLUCOSE 114 (H) 01/29/2021 1556   GLUCOSE 94 03/27/2020 0938   BUN 13 01/29/2021 1556  CREATININE 0.96 01/29/2021 1556   CREATININE 0.86 02/01/2019 0950   CALCIUM 9.3 01/29/2021 1556   PROT 6.9 01/29/2021 1556   ALBUMIN 4.3 01/29/2021 1556   AST 12 01/29/2021 1556   ALT 22 01/29/2021 1556   ALKPHOS 90 01/29/2021 1556   BILITOT <0.2 01/29/2021 1556   GFRNONAA 86 02/01/2019 0950   GFRAA 99 02/01/2019 0950       ASSESSMENT AND PLAN 42 y.o. year old female who continues to be routinely followed in this office for seizures, chronic migraines and RLS.     Seizure disorder -Well-controlled on lamotrigine 100 mg twice daily -refill provided -Intolerant to Keppra -Obtain lamotrigine level for routine therapeutic drug level monitoring -Lamotrigine level 01/2021 5.9 -CBC and CMP 09/2021 WNL -Discussed avoidance of seizure provoking events and to call office with any seizure episodes or concerns  Migraine, chronic -Increase migraine occurrence in setting of increased stressors -continue topiramate  BID - refill up to date. Advised to call if she wishes to increase dose in the future but for now we will continue current dosage  Restless leg syndrome -Well-controlled on Requip 0.25 mg nightly -refill placed    Follow-up in 1 year or call earlier if needed    CC:  Harold Hedge, MD    I spent 21 minutes of face-to-face and non-face-to-face time with patient.  This included previsit chart review, lab review, study review, order entry, electronic health record documentation, patient education and discussion regarding history of seizures and ongoing use of AED, chronic migraines with recent worsening and use of medications, RLS and  medication management and answered all other questions to patient satisfaction   Ihor Austin, AGNP-BC  [facility] [address] [place], [place] [address]  Phone [phone] Fax [phone] Note: This document was prepared with digital dictation and possible smart phrase technology. Any transcriptional errors that result from this process are unintentional.

## 2022-04-30 LAB — LAMOTRIGINE LEVEL: Lamotrigine Lvl: 7.2 ug/mL (ref 2.0–20.0)

## 2022-06-28 ENCOUNTER — Other Ambulatory Visit: Payer: Self-pay | Admitting: Adult Health

## 2022-06-28 DIAGNOSIS — G40909 Epilepsy, unspecified, not intractable, without status epilepticus: Secondary | ICD-10-CM

## 2022-08-18 ENCOUNTER — Telehealth: Payer: Self-pay | Admitting: Adult Health

## 2022-08-18 NOTE — Telephone Encounter (Signed)
Informed pt of appt change- NP out 6/10.

## 2023-01-26 ENCOUNTER — Other Ambulatory Visit: Payer: Self-pay | Admitting: Internal Medicine

## 2023-01-26 DIAGNOSIS — Z1231 Encounter for screening mammogram for malignant neoplasm of breast: Secondary | ICD-10-CM

## 2023-03-09 ENCOUNTER — Other Ambulatory Visit: Payer: Self-pay | Admitting: Adult Health

## 2023-03-09 DIAGNOSIS — G40909 Epilepsy, unspecified, not intractable, without status epilepticus: Secondary | ICD-10-CM

## 2023-03-12 DIAGNOSIS — Z1231 Encounter for screening mammogram for malignant neoplasm of breast: Secondary | ICD-10-CM

## 2023-04-26 ENCOUNTER — Telehealth: Payer: BC Managed Care – PPO | Admitting: Adult Health

## 2023-06-07 ENCOUNTER — Telehealth (INDEPENDENT_AMBULATORY_CARE_PROVIDER_SITE_OTHER): Payer: BC Managed Care – PPO | Admitting: Adult Health

## 2023-06-07 ENCOUNTER — Encounter: Payer: Self-pay | Admitting: Adult Health

## 2023-06-07 DIAGNOSIS — G43709 Chronic migraine without aura, not intractable, without status migrainosus: Secondary | ICD-10-CM | POA: Diagnosis not present

## 2023-06-07 DIAGNOSIS — G40909 Epilepsy, unspecified, not intractable, without status epilepticus: Secondary | ICD-10-CM | POA: Diagnosis not present

## 2023-06-07 DIAGNOSIS — G2581 Restless legs syndrome: Secondary | ICD-10-CM | POA: Diagnosis not present

## 2023-06-07 MED ORDER — ROPINIROLE HCL 0.25 MG PO TABS: 0.2500 mg | ORAL_TABLET | Freq: Every day | ORAL | 3 refills | Status: DC

## 2023-06-07 MED ORDER — TOPIRAMATE 100 MG PO TABS: 100.0000 mg | ORAL_TABLET | Freq: Two times a day (BID) | ORAL | 3 refills | Status: DC

## 2023-06-07 MED ORDER — LAMOTRIGINE 100 MG PO TABS: 100.0000 mg | ORAL_TABLET | Freq: Two times a day (BID) | ORAL | 3 refills | Status: DC

## 2023-06-07 NOTE — Progress Notes (Signed)
[facility]  PATIENT: Lindsay Aguilar DOB: 04-23-1981   REASON FOR VISIT:  seizure disorder, restless leg syndrome, and recurrent migraines HISTORY FROM: Patient  Virtual Visit via Video Note  I connected with Alexyia Guarino on 06/07/23 at  3:30 PM EDT by a video enabled telemedicine application and verified that I am speaking with the correct person using two identifiers.  Location: Patient: in car Provider: in office   I discussed the limitations of evaluation and management by telemedicine and the availability of in person appointments. The patient expressed understanding and agreed to proceed.    HISTORY OF PRESENT ILLNESS:  Lindsay Aguilar is a 42 y.o. female who continues to be followed in this office for history of seizure disorder, chronic migraines and RLS.    HISTORY PSMs Lindsay Aguilar is a 42 year old pleasant Caucasian lady who 3 days ago on Monday night had an episode of witnessed seizure in sleep. She is unable to describe the episode and the husband was an eyewitness.. The patient and husband went to bed at the usual time. Her daughter who sleeps with them woke up and shouted.at about 2 AM which woke up the husband  who noticed that his wife had tonic posturing of her upper extremities with tremulousness. Her mouth was closed. She had tongue bite. She had incontinence of urine. The patient  stopped shaking after 3-5 minutes. She appeared to be postictal with  transient disorientation and confusion. She recalls the EMS arriving to the house.and her disorientation started clearing by the time she reached the hospital. She had noncontrast CT scan of the head done on 08/16/2015 which I personally reviewed and is unremarkable except for evidence of incidental sinusitis. Basic metabolic panel labs and CBC were unremarkable. Patient had started a cold medication on that day which include atenolol, guaifenesin and phenylephrine. She has no known prior history  of seizures, significant head injury with loss of consciousness, childhood meningitis, neurological illness. She denies drinking or recreational abuse drugs or drinking significant amounts of alcohol. There is no family history of epilepsy either. She feels back to baseline and has no complaints today. She denies sleep deprivation, significant stress, irregular eating and sleeping habits. She does not drink excessive amounts of caffeine  Update 10/22/2015 ; PS She returns for follow-up after initial consultation visit 2 and half months ago. She continues to do well without recurrent seizure episodes. She had an MRI scan of the brain done on 08/19/15 which I personally reviewed and is normal without any structural abnormalities in the brain. EEG done on 08/22/15 is normal without definite epileptiform activity. Patient states she is not had any further seizure-like episodes or any other neurological complaints. She is keen to start driving again soon UPDATE 04/03/2017CM Ms  Lindsay Aguilar, 42 year old female returns for follow-up. She has a history of 2 seizure events since September both occurred while sleeping. Her most recent seizure on 02/12/16 occurred while she was napping in the afternoon with her daughter. She had some generalized shaking of her extremities that lasted approximately 3-4 minutes with loss of consciousness and incontinence of urine. She is markedly obese and may have sleep apnea. After her most recent event she needs anticonvulsant medications. MRI of the brain in the past has been normal. EEG has been normal UPDATE 07/21/2017CM. [name] 42 year old female returns for follow-up. She was last seen in April and had 2 previous seizure events. She was placed on Keppra at her last visit and asked to get  a sleep study . 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 She was advised to lose weight and exercise. In  addition there were frequent periodic limb movements of sleep with associated sleep disruption. Will check ferritin level and iron studies. She has had a low hemoglobin in the past. She returns for reevaluation. She has not had further seizure events. She was made aware she still cannot drive for 3 months since her last seizure was 02/12/2016. UPDATE 10/04/2018CM Lindsay Aguilar, 42 year old female returns for follow-up with history of seizure disorder currently on Keppra with side effects of acid reflux. She has been placed on Prilosec. Last seizure event was in April 2017. She has a history of restless legs confirmed by sleep study .Her iron studies and ferritin have been normal. She remains overweight as been encouraged to lose weight and exercise. She is currently going through a divorce which she reportedly claims is very stressful for her particularly under the circumstances which it occurred. She caught  her husband watching her sister take a shower. She returns for reevaluation. She had an endometrial ablation in April 2018 She is wanting to be placed on Valium for her anxiety however she was advised to follow-up with her primary care for that . Valium is a controlled substance. UPDATE 7/31/2019CM Lindsay Aguilar, 42 year old female returns for follow-up with history of seizure disorder currently on Keppra and has had side effects of acid reflux in the past.  She reports in May she felt like she was going to have a seizure so she increased her dose on her own without calling us.  She increased it to 1-1/2 tabs in the morning and 1 tab at night of the 500 mg.  She had a seizure last night getting out of the car.  Her significant other helped her to the ground it just lasted moments no apparent injury except some scrapes on her feet and hands.  Significant other reports that since she increased the dose back in May she has been more irritable and agitated.  Prior to this last seizure was in April 2017.  She  returns for reevaluation Update 09/20/2018 CM: [name], 42 year old female returns for follow-up with history of seizure disorder.  Her last seizure was 06/14/2018.  She was on Keppra at the time and she had increased her dose on her own and became much more irritable and agitated.  She also had acid reflux on Keppra.  After her last visit she made a slow switch to Lamictal and titrated off of Keppra due to side effects she returns today feeling much better.  She also has history of restless legs and is on Requip.  She was advised to stop her Prilosec now for acid reflux from the Keppra.  She returns for reevaluation  Update 09/21/2019: Ms. Sparger is a 42 year old female who is being seen today for seizure disorder follow-up.  She has been doing well on Lamictal 100 mg twice daily without side effects.  She has not had any seizure activity.  She does endorse worsening migraines with prior history of migraines when she was younger but have been slowly worsening since her hysterectomy.  Over the past few months, she has been experiencing migraines approximately once weekly that are located right fronto-parietal region associated with photophobia, phonophobia and nausea. Typically last hrs to 1 day but recent migraine has been present for the past 3 days.  She has used  Tylenol and ibuprofen which typically provides relief but has not improved current migraine.  She does endorse being under increased stress at home recently and questions whether current headache is stress related.  She has not previously been on migraine preventatives or abortive medications.  No further concerns at this time.  Virtual visit 12/26/2019: Lindsay Aguilar is a 42 year old female who is being seen today via virtual visit for seizure migraine follow-up.  Migraines have greatly improved with ongoing use of Topamax 75 mg twice daily with only occasional residual headaches.  Continues on Lamictal 100 mg twice daily without seizure activity.   Continues on Requip 0.25 mg nightly for RLS with ongoing benefit.  She has been stable and has no concerns at today's visit.   Update 01/29/2021 JM: Returns for yearly seizure and migraine follow-up  Compliant on Lamictal 100 mg twice daily tolerating without side effects and no recent seizure activity  Compliant on Requip 0.25 mg nightly for RLS tolerating with continued benefit  Compliant on topiramate 75 mg twice daily -does report recent increase frequency of headaches occurring approximately 1 time weekly.  Does endorse increased stressors and being switched from Humira to [facility] for OA but had difficulty obtaining Embrel and has been experiencing increasing pains -recently just started on Embrel.  She questions if dosage could be increased  She is in the process of switching PCPs as she was previously seen by Marcelline Mates, PA but he recently left and she has not been able to get refills on furosemide, omeprazole or Celebrex She also reports recently stopping her ferrous sulfate and requests iron levels be obtained  No further concerns at this time  Update 04/27/2022 JM: Patient returns for yearly follow-up unaccompanied  Seizure disorder:  Compliant on Lamictal 100 mg twice daily, denies side effects, denies any recent seizure activity  Migraines:  Compliant on topiramate 100 mg twice daily.  Increased dose at prior visit which she is tolerating well without side effects.  Initially after increasing dose, she found significant improvement of her migraines with only rare occurrences.  Unfortunately, she has been dealing with significant stress since December and has been having more frequent migraines, she has not able to say how many migraines per month.    RLS: Stable on Requip 0.25 mg nightly, denies side effects   Update 06/07/2023 JM: Returns for yearly follow-up via MyChart video visit.  Denies any recent seizure activity.  Remains on lamotrigine 100 mg BID without side  effects.  Continues on topiramate 100mg  BID for migraine prophylaxis. Reports about 2 migraines per month. PCP recently added Relpax as needed. First tab helps to decrease severity but will normally need to take 2nd tab for resolution of migraine. Has noticed prolonged screen time as migraine trigger, has decreased brightness of screen with some benefit.   Continues on Requip 0.25mg  nightly for RLS with adequate control       REVIEW OF SYSTEMS: Full 14 system review of systems performed and notable only for those listed, all others are neg:  Constitutional: neg  Cardiovascular: neg Ear/Nose/Throat: neg  Skin: neg Eyes: neg Respiratory: neg Gastroitestinal: neg  Hematology/Lymphatic: neg Endocrine: neg Musculoskeletal: Diffuse joint pain Allergy/Immunology: neg Neurological:  seizure disorder last seizure 06/14/2018, migraines psychiatric: neg Sleep : Restless legs, fatigue  Allergies  Allergen Reactions   Amoxicillin Other (See Comments)    Yeast infection   Ephedrine Other (See Comments)    Avoids due to possible cause of seizure   Current Outpatient Medications on  File Prior to Visit  Medication Sig Dispense Refill   Acetaminophen (TYLENOL PO) Take by mouth as needed.     b complex vitamins capsule Take 1 capsule by mouth daily.     Cholecalciferol (VITAMIN D-3) 25 MCG (1000 UT) CAPS Take 1 capsule (1,000 Units total) by mouth daily. 30 capsule 0   diazepam (VALIUM) 5 MG tablet Take 1 tablet (5 mg total) by mouth every 12 (twelve) hours as needed. 45 tablet 0   DULoxetine (CYMBALTA) 20 MG capsule Take 2 capsules (40 mg total) by mouth daily. 180 capsule 1   ENBREL SURECLICK 50 MG/ML injection Inject into the skin once a week.     ferrous sulfate 325 (65 FE) MG tablet TAKE 1 TABLET (325 MG TOTAL) BY MOUTH 2 (TWO) TIMES DAILY WITH A MEAL. 180 tablet 2   furosemide (LASIX) 40 MG tablet Take 1 tablet (40 mg total) by mouth daily as needed. 30 tablet 1   Krill Oil 350 MG CAPS  Take 1 capsule by mouth daily.     lamoTRIgine (LAMICTAL) 100 MG tablet TAKE 1 TABLET(100 MG) BY MOUTH TWICE DAILY 180 tablet 3   omeprazole (PRILOSEC) 20 MG capsule TAKE 1 CAPSULE BY MOUTH EVERY DAY 90 capsule 3   potassium chloride SA (KLOR-CON) 20 MEQ tablet TAKE 1 TABLET(20 MEQ) BY MOUTH DAILY 90 tablet 0   rOPINIRole (REQUIP) 0.25 MG tablet Take 1 tablet (0.25 mg total) by mouth at bedtime. 90 tablet 3   topiramate (TOPAMAX) 100 MG tablet TAKE 1 TABLET(100 MG) BY MOUTH TWICE DAILY 60 tablet 2   Zinc 30 MG CAPS Take 30 mg by mouth daily.     No current facility-administered medications on file prior to visit.   Past Medical History:  Diagnosis Date   GERD (gastroesophageal reflux disease)    History of CHF (congestive heart failure)    2006 during first preg. dx chf--  evaluated by cardologist (dr Melburn Popper)-- resolved after birth (no issue w/ other preg's)   Iron deficiency anemia    Menometrorrhagia    Nocturnal seizures [facility]) neurologist-  dr sethi/ dr Anne Hahn ([facility])   last sz 06/14/18   RLS (restless legs syndrome)    Seasonal allergies    Wears glasses      PHYSICAL EXAM N/A d/t visit type    DIAGNOSTIC DATA (LABS, IMAGING, TESTING) - I reviewed patient records, labs, notes, testing and imaging myself where available.   CBC    Component Value Date/Time   WBC 9.8 01/29/2021 1556   WBC 6.4 03/27/2020 0938   RBC 4.83 01/29/2021 1556   RBC 4.54 03/27/2020 0938   HGB 13.8 01/29/2021 1556   HCT 41.6 01/29/2021 1556   PLT 272 01/29/2021 1556   MCV 86 01/29/2021 1556   MCH 28.6 01/29/2021 1556   MCH 24.7 (L) 06/23/2018 1541   MCHC 33.2 01/29/2021 1556   MCHC 33.5 03/27/2020 0938   RDW 12.2 01/29/2021 1556   LYMPHSABS 1.6 01/30/2020 0927   LYMPHSABS 2.6 06/05/2016 1144   MONOABS 0.5 01/30/2020 0927   EOSABS 0.1 01/30/2020 0927   EOSABS 0.1 06/05/2016 1144   BASOSABS 0.0 01/30/2020 0927   BASOSABS 0.0 06/05/2016 1144   CMP     Component Value  Date/Time   Aguilar 141 01/29/2021 1556   K 4.0 01/29/2021 1556   CL 105 01/29/2021 1556   CO2 21 01/29/2021 1556   GLUCOSE 114 (H) 01/29/2021 1556   GLUCOSE 94 03/27/2020 6962  BUN 13 01/29/2021 1556   CREATININE 0.96 01/29/2021 1556   CREATININE 0.86 02/01/2019 0950   CALCIUM 9.3 01/29/2021 1556   PROT 6.9 01/29/2021 1556   ALBUMIN 4.3 01/29/2021 1556   AST 12 01/29/2021 1556   ALT 22 01/29/2021 1556   ALKPHOS 90 01/29/2021 1556   BILITOT <0.2 01/29/2021 1556   GFRNONAA 86 02/01/2019 0950   GFRAA 99 02/01/2019 0950       ASSESSMENT AND PLAN 41 y.o. year old female who continues to be routinely followed in this office for seizures, chronic migraines and RLS.     Seizure disorder -Well-controlled on lamotrigine 100 mg twice daily -refill provided -Intolerant to Keppra -is scheduled for lab work 8/14 with PCP - will reach out to PCP to see if level can be obtained at that time -Lamotrigine level 04/2022 7.2 -CBC and CMP satisfactory monitored by PCP -Discussed avoidance of seizure provoking events and to call office with any seizure episodes or concerns  Migraine, chronic -Currently well-controlled with 2 migraine days per month -continue topiramate 100mg  BID - refill provided -Continue Relpax as needed per PCP  Restless leg syndrome -Well-controlled on Requip 0.25 mg nightly -refill placed    Follow-up in 1 year or call earlier if needed    CC:  Harold Hedge, MD    I spent 16 minutes of face-to-face and non-face-to-face time with patient via MyChart video visit.  This included previsit chart review, lab review, study review, order entry, electronic health record documentation, patient education and discussion regarding above diagnoses and treatment plan and answered all other questions to patient satisfaction   Ihor Austin, [facility]  [facility] [address] [place], [place] [address]  Phone [phone] Fax  [phone] Note: This document was prepared with digital dictation and possible smart phrase technology. Any transcriptional errors that result from this process are unintentional.

## 2023-06-07 NOTE — Patient Instructions (Signed)
Your Plan:  Continue current medication regimen for seizures, migraine and RLS  Have Lamictal levels checked - will see if this can be done with your primary doctor with lab draws next month    Follow up in 1 year or call earlier if needed     Thank you for coming to see [place] at [facility]. I hope we have been able to provide you high quality care today.  You may receive a patient satisfaction survey over the next few weeks. We would appreciate your feedback and comments so that we may continue to improve ourselves and the health of our patients.

## 2023-08-25 ENCOUNTER — Other Ambulatory Visit: Payer: Self-pay | Admitting: Vascular Surgery

## 2023-08-25 ENCOUNTER — Other Ambulatory Visit: Payer: Self-pay | Admitting: Obstetrics and Gynecology

## 2023-08-25 DIAGNOSIS — Z1231 Encounter for screening mammogram for malignant neoplasm of breast: Secondary | ICD-10-CM

## 2023-09-17 ENCOUNTER — Encounter (HOSPITAL_BASED_OUTPATIENT_CLINIC_OR_DEPARTMENT_OTHER): Payer: Self-pay

## 2023-09-17 ENCOUNTER — Encounter (HOSPITAL_BASED_OUTPATIENT_CLINIC_OR_DEPARTMENT_OTHER): Payer: Self-pay | Admitting: Radiology

## 2023-09-17 ENCOUNTER — Ambulatory Visit (HOSPITAL_BASED_OUTPATIENT_CLINIC_OR_DEPARTMENT_OTHER)
Admission: RE | Admit: 2023-09-17 | Discharge: 2023-09-17 | Disposition: A | Discharge status: Home / Self Care | Payer: BC Managed Care – PPO | Source: Ambulatory Visit | Attending: Obstetrics and Gynecology | Admitting: Obstetrics and Gynecology

## 2023-09-17 DIAGNOSIS — Z1231 Encounter for screening mammogram for malignant neoplasm of breast: Secondary | ICD-10-CM

## 2023-12-07 ENCOUNTER — Other Ambulatory Visit: Payer: Self-pay | Admitting: Obstetrics and Gynecology

## 2023-12-07 DIAGNOSIS — Z1231 Encounter for screening mammogram for malignant neoplasm of breast: Secondary | ICD-10-CM

## 2023-12-24 DIAGNOSIS — Z1231 Encounter for screening mammogram for malignant neoplasm of breast: Secondary | ICD-10-CM

## 2024-02-01 ENCOUNTER — Ambulatory Visit
Admission: RE | Admit: 2024-02-01 | Discharge: 2024-02-01 | Disposition: A | Discharge status: Home / Self Care | Source: Ambulatory Visit | Attending: Internal Medicine | Admitting: Internal Medicine

## 2024-02-01 DIAGNOSIS — Z1231 Encounter for screening mammogram for malignant neoplasm of breast: Secondary | ICD-10-CM

## 2024-06-05 ENCOUNTER — Ambulatory Visit: Admitting: Urology

## 2024-06-08 ENCOUNTER — Other Ambulatory Visit: Payer: Self-pay

## 2024-06-08 DIAGNOSIS — G40909 Epilepsy, unspecified, not intractable, without status epilepticus: Secondary | ICD-10-CM

## 2024-06-08 MED ORDER — LAMOTRIGINE 100 MG PO TABS: 100.0000 mg | ORAL_TABLET | Freq: Two times a day (BID) | ORAL | 0 refills | Status: DC

## 2024-06-26 ENCOUNTER — Ambulatory Visit: Admitting: Urology

## 2024-06-26 NOTE — Progress Notes (Incomplete)
 Chief Complaint: No chief complaint on file.   History of Present Illness:  Lindsay Aguilar is a 43 y.o. female who is seen in consultation from Lindsay Manna, MD for evaluation of ***.   Past Medical History:  Past Medical History:  Diagnosis Date   GERD (gastroesophageal reflux disease)    History of CHF (congestive heart failure)    2006 during first preg. dx chf--  evaluated by cardologist (dr calhoun)-- resolved after birth (no issue w/ other preg's)   Iron deficiency anemia    Menometrorrhagia    Nocturnal seizures [facility]) neurologist-  dr sethi/ [name])   last sz 06/14/18   RLS (restless legs syndrome)    Seasonal allergies    Wears glasses     Past Surgical History:  Past Surgical History:  Procedure Laterality Date   ABDOMINAL HYSTERECTOMY  11/21/2018   DILITATION & CURRETTAGE/HYSTROSCOPY WITH NOVASURE ABLATION N/A 03/04/2017   Procedure: DILATATION & CURETTAGE/HYSTEROSCOPY WITH NOVASURE ABLATION;  Surgeon: [name], MD;  Location: [facility];  Service: Gynecology;  Laterality: N/A;   LAPAROSCOPIC CHOLECYSTECTOMY  04/2008   LAPAROSCOPIC TUBAL LIGATION Bilateral 02/2016   TRANSTHORACIC ECHOCARDIOGRAM  03/09/2014   ef 55-60%/  trivial MR and TR   WISDOM TOOTH EXTRACTION  age 62    Allergies:  Allergies  Allergen Reactions   Amoxicillin Other (See Comments)    Yeast infection   Ephedrine  Other (See Comments)    Avoids due to possible cause of seizure    Family History:  Family History  Problem Relation Age of Onset   Anesthesia problems Other    Depression Father    Arthritis Mother    High Cholesterol Mother    Asthma Son    Healthy Son    Hematuria Daughter    Cancer Maternal Grandmother     Social History:  Social History   Tobacco Use   Smoking status: Former    Current packs/day: 0.00    Types: Cigarettes    Start date: 11/16/1996    Quit date: 11/16/2006    Years since quitting: 17.6    Smokeless tobacco: Never  Vaping Use   Vaping status: Never Used  Substance Use Topics   Alcohol use: Yes    Alcohol/week: 1.0 standard drink of alcohol    Types: 1 Glasses of wine per week    Comment: rare   Drug use: No    Review of symptoms:  Constitutional:  Negative for unexplained weight loss, night sweats, fever, chills ENT:  Negative for nose bleeds, sinus pain, painful swallowing CV:  Negative for chest pain, shortness of breath, exercise intolerance, palpitations, loss of consciousness Resp:  Negative for cough, wheezing, shortness of breath GI:  Negative for nausea, vomiting, diarrhea, bloody stools GU:  Positives noted in HPI; otherwise negative for gross hematuria, dysuria, urinary incontinence Neuro:  Negative for seizures, poor balance, limb weakness, slurred speech Psych:  Negative for lack of energy, depression, anxiety Endocrine:  Negative for polydipsia, polyuria, symptoms of hypoglycemia (dizziness, hunger, sweating) Hematologic:  Negative for anemia, purpura, petechia, prolonged or excessive bleeding, use of anticoagulants  Allergic:  Negative for difficulty breathing or choking as a result of exposure to anything; no shellfish allergy; no allergic response (rash/itch) to materials, foods  Physical exam: LMP 02/12/2017 (Exact Date)  GENERAL APPEARANCE:  Well appearing, well developed, well nourished, NAD HEENT: Atraumatic, Normocephalic. NECK: Normal appearance LUNGS: Normal inspiratory and expiratory excursion HEART: Regular Rate ABDOMEN: ***  EXTREMITIES: Moves all extremities well.  Without clubbing, cyanosis, or edema. NEUROLOGIC:  Alert and oriented x 3, normal gait, CN II-XII grossly intact.  MENTAL STATUS:  Appropriate. SKIN:  Warm, dry and intact.    Results: No results found for this or any previous visit (from the past 24 hours).  I have reviewed prior patient's records  I have reviewed referring/prior physicians records  I have reviewed  urinalysis  I have reviewed prior urine cultures  I reviewed prior imaging studies  Assessment: No diagnosis found.   Plan: ***

## 2024-08-24 ENCOUNTER — Other Ambulatory Visit: Payer: Self-pay

## 2024-08-24 MED ORDER — ROPINIROLE HCL 0.25 MG PO TABS: 0.2500 mg | ORAL_TABLET | Freq: Every day | ORAL | 0 refills | Status: DC

## 2024-09-07 ENCOUNTER — Other Ambulatory Visit: Payer: Self-pay | Admitting: *Deleted

## 2024-09-07 DIAGNOSIS — G40909 Epilepsy, unspecified, not intractable, without status epilepticus: Secondary | ICD-10-CM

## 2024-09-07 MED ORDER — TOPIRAMATE 100 MG PO TABS: 100.0000 mg | ORAL_TABLET | Freq: Two times a day (BID) | ORAL | 0 refills | Status: DC

## 2024-09-14 ENCOUNTER — Other Ambulatory Visit: Payer: Self-pay | Admitting: *Deleted

## 2024-09-14 DIAGNOSIS — G40909 Epilepsy, unspecified, not intractable, without status epilepticus: Secondary | ICD-10-CM

## 2024-09-14 MED ORDER — TOPIRAMATE 100 MG PO TABS: 100.0000 mg | ORAL_TABLET | Freq: Two times a day (BID) | ORAL | 0 refills | Status: DC

## 2024-09-19 NOTE — Progress Notes (Unsigned)
 [facility]  PATIENT: Lindsay Aguilar DOB: 01-23-1981   REASON FOR VISIT:  seizure disorder, restless leg syndrome, and recurrent migraines HISTORY FROM: Patient  Virtual Visit via Video Note  I connected with Lindsay Aguilar on 09/20/24 at  7:45 AM EST by a video enabled telemedicine application and verified that I am speaking with the correct person using two identifiers.  Location: Patient: at home, [place], [place] Provider: in office   I discussed the limitations of evaluation and management by telemedicine and the availability of in person appointments. The patient expressed understanding and agreed to proceed.  Follow-up visit:  Prior visit: 06/07/2023   Brief HPI:  Lindsay Aguilar is a 43 y.o. female who continues to be followed in this office for history of seizure disorder, chronic migraines and RLS.  Onset of seizure in 2016 with generalized tonic-clonic seizure and sleep.  Recurrent seizure in 2017 again during sleep and was started on Keppra  500 mg twice daily.  She was switched from Keppra  to Lamictal  in 2019 due to mood side effects.  She was diagnosed with RLS after completion of sleep study in 2017, no significant apnea observed.  History of chronic migraine headaches with worsening in 2020 and was started on topiramate .   Interval history:   Patient returns for follow-up visit via MyChart video visit after prior visit over 1 year ago.   Denies any recurrent seizure activity, reports compliance on lamotrigine  100 mg twice daily.  Denies side effects.    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  She is having about 1 migraine a week which is debilitating.  Relpax does work to take the edge off.  She is interested in adjusting Topamax  dosage.  Continues on Requip  for RLS,  continued benefit at night but has noticed more difficulty during the day especially when sitting on the computer doing work (works from home). Does help to get up and move but symptoms quickly return. She did have recent lab work by PCP, iron level not checked.       REVIEW OF SYSTEMS: Full 14 system review of systems performed and notable only for those listed, all others are neg:  Constitutional: neg  Cardiovascular: neg Ear/Nose/Throat: neg  Skin: neg Eyes: neg Respiratory: neg Gastroitestinal: neg  Hematology/Lymphatic: neg Endocrine: neg Musculoskeletal: Diffuse joint pain Allergy/Immunology: neg Neurological:  seizure disorder last seizure 06/14/2018, migraines psychiatric: neg Sleep : Restless legs, fatigue  Allergies  Allergen Reactions   Amoxicillin Other (See Comments)    Yeast infection   Ephedrine  Other (See Comments)    Avoids due to possible cause of seizure   Current Outpatient Medications on File Prior to Visit  Medication Sig Dispense Refill   Acetaminophen  (TYLENOL  PO) Take by mouth as needed.     b complex vitamins capsule Take 1 capsule by mouth daily.     Cholecalciferol (VITAMIN D -3) 25 MCG (1000 UT) CAPS Take 1 capsule (1,000 Units total) by mouth daily. 30 capsule 0   diazepam  (VALIUM ) 5 MG tablet Take 1 tablet (5 mg total) by mouth every 12 (twelve) hours as needed. 45 tablet 0   DULoxetine  (CYMBALTA ) 20 MG capsule Take 2 capsules (40 mg total) by mouth daily. 180 capsule 1   ENBREL SURECLICK 50 MG/ML injection Inject into the skin once a week.     ferrous sulfate   325 (65 FE) MG tablet TAKE 1 TABLET (325 MG TOTAL) BY MOUTH 2 (TWO) TIMES DAILY WITH A MEAL. 180 tablet 2   furosemide  (LASIX ) 40 MG tablet Take 1 tablet (40 mg total) by mouth daily as needed. 30 tablet 1   Krill Oil 350 MG CAPS Take 1 capsule by mouth daily.     lamoTRIgine  (LAMICTAL ) 100 MG tablet Take 1 tablet (100 mg total) by mouth 2 (two) times daily. 180 tablet 0   omeprazole   (PRILOSEC ) 20 MG capsule TAKE 1 CAPSULE BY MOUTH EVERY DAY 90 capsule 3   potassium chloride  SA (KLOR-CON ) 20 MEQ tablet TAKE 1 TABLET(20 MEQ) BY MOUTH DAILY 90 tablet 0   rOPINIRole  (REQUIP ) 0.25 MG tablet Take 1 tablet (0.25 mg total) by mouth at bedtime. MUST HAVE APPOINTMENT FOR FURTHER REFILLS 30 tablet 0   topiramate  (TOPAMAX ) 100 MG tablet Take 1 tablet (100 mg total) by mouth 2 (two) times daily. 60 tablet 0   Zinc 30 MG CAPS Take 30 mg by mouth daily.     No current facility-administered medications on file prior to visit.   Past Medical History:  Diagnosis Date   GERD (gastroesophageal reflux disease)    History of CHF (congestive heart failure)    2006 during first preg. dx chf--  evaluated by cardologist (dr calhoun)-- resolved after birth (no issue w/ other preg's)   Iron deficiency anemia    Menometrorrhagia    Nocturnal seizures [facility]) neurologist-  dr sethi/ dr jenel ([facility])   last sz 06/14/18   RLS (restless legs syndrome)    Seasonal allergies    Wears glasses      PHYSICAL EXAM N/A d/t visit type    DIAGNOSTIC DATA (LABS, IMAGING, TESTING) - I reviewed patient records, labs, notes, testing and imaging myself where available.   CBC    Component Value Date/Time   WBC 9.8 01/29/2021 1556   WBC 6.4 03/27/2020 0938   RBC 4.83 01/29/2021 1556   RBC 4.54 03/27/2020 0938   HGB 13.8 01/29/2021 1556   HCT 41.6 01/29/2021 1556   PLT 272 01/29/2021 1556   MCV 86 01/29/2021 1556   MCH 28.6 01/29/2021 1556   MCH 24.7 (L) 06/23/2018 1541   MCHC 33.2 01/29/2021 1556   MCHC 33.5 03/27/2020 0938   RDW 12.2 01/29/2021 1556   LYMPHSABS 1.6 01/30/2020 0927   LYMPHSABS 2.6 06/05/2016 1144   MONOABS 0.5 01/30/2020 0927   EOSABS 0.1 01/30/2020 0927   EOSABS 0.1 06/05/2016 1144   BASOSABS 0.0 01/30/2020 0927   BASOSABS 0.0 06/05/2016 1144   CMP     Component Value Date/Time   NA 141 01/29/2021 1556   K 4.0 01/29/2021 1556   CL 105 01/29/2021 1556    CO2 21 01/29/2021 1556   GLUCOSE 114 (H) 01/29/2021 1556   GLUCOSE 94 03/27/2020 0938   BUN 13 01/29/2021 1556   CREATININE 0.96 01/29/2021 1556   CREATININE 0.86 02/01/2019 0950   CALCIUM 9.3 01/29/2021 1556   PROT 6.9 01/29/2021 1556   ALBUMIN 4.3 01/29/2021 1556   AST 12 01/29/2021 1556   ALT 22 01/29/2021 1556   ALKPHOS 90 01/29/2021 1556   BILITOT <0.2 01/29/2021 1556   GFRNONAA 86 02/01/2019 0950   GFRAA 99 02/01/2019 0950       ASSESSMENT AND PLAN 43 y.o. year old female who continues to be routinely followed in this office for seizures, chronic migraines and RLS.     Seizure disorder -Well-controlled  on lamotrigine  100 mg twice daily -refill provided -will check lamotrigine  level (she will have this completed at local [facility]), other routine labs recently completed by PCP which are satisfactory -Intolerant to Keppra  -Discussed avoidance of seizure provoking events and to call office with any seizure episodes or concerns  Migraine, chronic -increased migraines likely in setting of increased stress -continue topiramate  100mg  AM and increase nighttime dose to 150mg . Advised to call after 2-3 weeks if no beenfit or sooner if difficulty tolerating -Continue Relpax as needed per PCP  Restless leg syndrome -Well-controlled at night on Requip  0.25 mg nightly -more symptoms during the day interferring with work -will add additional 0.25mg  tablet 0.5-1 tab during the day as needed -will check iron panel (she will have this completed at local [facility])    Follow-up in 1 year or call earlier if needed    CC:  Hande, Vishwanath, MD    I personally spent a total of 30 minutes in the care of the patient today including preparing to see the patient, counseling and educating, placing orders, and documenting clinical information in the EHR.   Harlene Bogaert, AGNP-BC  [facility] [address] [place], [place] [address]  Phone  [phone] Fax [phone] Note: This document was prepared with digital dictation and possible smart phrase technology. Any transcriptional errors that result from this process are unintentional.

## 2024-09-20 ENCOUNTER — Telehealth (INDEPENDENT_AMBULATORY_CARE_PROVIDER_SITE_OTHER): Admitting: Adult Health

## 2024-09-20 ENCOUNTER — Encounter: Payer: Self-pay | Admitting: Adult Health

## 2024-09-20 ENCOUNTER — Other Ambulatory Visit: Payer: Self-pay | Admitting: Adult Health

## 2024-09-20 DIAGNOSIS — G43709 Chronic migraine without aura, not intractable, without status migrainosus: Secondary | ICD-10-CM

## 2024-09-20 DIAGNOSIS — G40909 Epilepsy, unspecified, not intractable, without status epilepticus: Secondary | ICD-10-CM | POA: Diagnosis not present

## 2024-09-20 DIAGNOSIS — G2581 Restless legs syndrome: Secondary | ICD-10-CM

## 2024-09-20 MED ORDER — LAMOTRIGINE 100 MG PO TABS: 100.0000 mg | ORAL_TABLET | Freq: Two times a day (BID) | ORAL | 0 refills | Status: DC

## 2024-09-20 MED ORDER — TOPIRAMATE 100 MG PO TABS: ORAL_TABLET | ORAL | 11 refills | Status: DC

## 2024-09-20 MED ORDER — ROPINIROLE HCL 0.25 MG PO TABS: ORAL_TABLET | ORAL | 11 refills | Status: AC

## 2024-12-07 ENCOUNTER — Encounter: Payer: Self-pay | Admitting: Adult Health

## 2024-12-07 DIAGNOSIS — G43709 Chronic migraine without aura, not intractable, without status migrainosus: Secondary | ICD-10-CM

## 2024-12-07 DIAGNOSIS — G40909 Epilepsy, unspecified, not intractable, without status epilepticus: Secondary | ICD-10-CM

## 2024-12-08 MED ORDER — TOPIRAMATE 100 MG PO TABS: ORAL_TABLET | ORAL | 9 refills | Status: AC

## 2024-12-08 NOTE — Telephone Encounter (Signed)
 Tried to call pharmacy again but was on hold for >10 minutes with no answer. Re-sending refill with message to cancel all previous prescriptions.

## 2024-12-18 ENCOUNTER — Other Ambulatory Visit: Payer: Self-pay | Admitting: Adult Health

## 2024-12-18 DIAGNOSIS — G40909 Epilepsy, unspecified, not intractable, without status epilepticus: Secondary | ICD-10-CM

## 2025-09-20 ENCOUNTER — Telehealth: Admitting: Adult Health
